# Patient Record
Sex: Male | Born: 1950
Health system: Southern US, Community
[De-identification: ages and names within clinical notes are randomized; demographics above are authoritative.]

## PROBLEM LIST (undated history)

## (undated) DIAGNOSIS — R7302 Impaired glucose tolerance (oral): Secondary | ICD-10-CM

## (undated) DIAGNOSIS — M5416 Radiculopathy, lumbar region: Secondary | ICD-10-CM

## (undated) DIAGNOSIS — Z800 Family history of malignant neoplasm of digestive organs: Secondary | ICD-10-CM

## (undated) DIAGNOSIS — T594X4A Toxic effect of chlorine gas, undetermined, initial encounter: Secondary | ICD-10-CM

## (undated) DIAGNOSIS — R209 Unspecified disturbances of skin sensation: Secondary | ICD-10-CM

## (undated) DIAGNOSIS — I5189 Other ill-defined heart diseases: Secondary | ICD-10-CM

## (undated) DIAGNOSIS — M4802 Spinal stenosis, cervical region: Secondary | ICD-10-CM

## (undated) DIAGNOSIS — M1990 Unspecified osteoarthritis, unspecified site: Secondary | ICD-10-CM

## (undated) DIAGNOSIS — M549 Dorsalgia, unspecified: Secondary | ICD-10-CM

## (undated) DIAGNOSIS — E785 Hyperlipidemia, unspecified: Secondary | ICD-10-CM

## (undated) HISTORY — DX: Family history of malignant neoplasm of digestive organs: Z80.0

## (undated) HISTORY — DX: Radiculopathy, lumbar region: M54.16

## (undated) HISTORY — PX: COLONOSCOPY: SHX174

## (undated) HISTORY — PX: BUNIONECTOMY: SHX129

## (undated) HISTORY — DX: Impaired glucose tolerance (oral): R73.02

## (undated) HISTORY — DX: Toxic effect of chlorine gas, undetermined, initial encounter: T59.4X4A

## (undated) HISTORY — PX: FOOT SURGERY: SHX648

## (undated) HISTORY — DX: Hyperlipidemia, unspecified: E78.5

## (undated) HISTORY — PX: ROOT CANAL: SHX2363

## (undated) HISTORY — DX: Other ill-defined heart diseases: I51.89

## (undated) HISTORY — PX: OTHER SURGICAL HISTORY: SHX169

## (undated) HISTORY — DX: Unspecified disturbances of skin sensation: R20.9

## (undated) HISTORY — DX: Unspecified osteoarthritis, unspecified site: M19.90

## (undated) HISTORY — DX: Dorsalgia, unspecified: M54.9

---

## 1998-12-10 ENCOUNTER — Encounter: Payer: Self-pay | Admitting: Internal Medicine

## 1999-02-18 ENCOUNTER — Ambulatory Visit (HOSPITAL_COMMUNITY): Admission: RE | Admit: 1999-02-18 | Discharge: 1999-02-18 | Payer: Self-pay | Admitting: Internal Medicine

## 1999-08-08 ENCOUNTER — Encounter: Payer: Self-pay | Admitting: Pulmonary Disease

## 1999-08-08 ENCOUNTER — Ambulatory Visit (HOSPITAL_COMMUNITY): Admission: RE | Admit: 1999-08-08 | Discharge: 1999-08-08 | Payer: Self-pay | Admitting: Pulmonary Disease

## 2000-01-26 ENCOUNTER — Inpatient Hospital Stay (HOSPITAL_COMMUNITY): Admission: EM | Admit: 2000-01-26 | Discharge: 2000-01-26 | Payer: Self-pay | Admitting: Emergency Medicine

## 2004-07-12 ENCOUNTER — Ambulatory Visit: Payer: Self-pay | Admitting: Internal Medicine

## 2004-10-05 ENCOUNTER — Ambulatory Visit: Payer: Self-pay | Admitting: Internal Medicine

## 2006-08-06 ENCOUNTER — Ambulatory Visit: Payer: Self-pay | Admitting: Internal Medicine

## 2006-08-06 LAB — CONVERTED CEMR LAB
ALT: 27 units/L (ref 0–40)
AST: 33 units/L (ref 0–37)
Albumin: 3.8 g/dL (ref 3.5–5.2)
Alkaline Phosphatase: 75 units/L (ref 39–117)
BUN: 14 mg/dL (ref 6–23)
Basophils Absolute: 0.4 10*3/uL — ABNORMAL HIGH (ref 0.0–0.1)
Basophils Relative: 5.1 % — ABNORMAL HIGH (ref 0.0–1.0)
Bilirubin Urine: NEGATIVE
CO2: 29 meq/L (ref 19–32)
Calcium: 9.1 mg/dL (ref 8.4–10.5)
Chloride: 107 meq/L (ref 96–112)
Chol/HDL Ratio, serum: 3.3
Cholesterol: 255 mg/dL (ref 0–200)
Creatinine, Ser: 1.2 mg/dL (ref 0.4–1.5)
Crystals: NEGATIVE
Eosinophil percent: 8.9 % — ABNORMAL HIGH (ref 0.0–5.0)
GFR calc non Af Amer: 66 mL/min
Glomerular Filtration Rate, Af Am: 80 mL/min/{1.73_m2}
Glucose, Bld: 112 mg/dL — ABNORMAL HIGH (ref 70–99)
HCT: 43.8 % (ref 39.0–52.0)
HDL: 76.4 mg/dL (ref >39.0)
Hemoglobin: 14.4 g/dL (ref 13.0–17.0)
Ketones, ur: NEGATIVE mg/dL
LDL DIRECT: 171.7 mg/dL
Leukocytes, UA: NEGATIVE
Lymphocytes Relative: 49.2 % — ABNORMAL HIGH (ref 12.0–46.0)
MCHC: 32.9 g/dL (ref 30.0–36.0)
MCV: 93.2 fL (ref 78.0–100.0)
Monocytes Absolute: 0.6 10*3/uL (ref 0.2–0.7)
Monocytes Relative: 8.5 % (ref 3.0–11.0)
Mucus, UA: NEGATIVE
Neutro Abs: 2.1 10*3/uL (ref 1.4–7.7)
Neutrophils Relative %: 28.3 % — ABNORMAL LOW (ref 43.0–77.0)
Nitrite: NEGATIVE
PSA: 0.8 ng/mL (ref 0.10–4.00)
Platelets: 198 10*3/uL (ref 150–400)
Potassium: 3.9 meq/L (ref 3.5–5.1)
RBC: 4.7 M/uL (ref 4.22–5.81)
RDW: 11.8 % (ref 11.5–14.6)
Sodium: 142 meq/L (ref 135–145)
Specific Gravity, Urine: 1.025 (ref 1.000–1.03)
TSH: 3.3 microintl units/mL (ref 0.35–5.50)
Total Bilirubin: 1.3 mg/dL — ABNORMAL HIGH (ref 0.3–1.2)
Total Protein, Urine: NEGATIVE mg/dL
Total Protein: 6.8 g/dL (ref 6.0–8.3)
Triglyceride fasting, serum: 72 mg/dL (ref 0–149)
Urine Glucose: NEGATIVE mg/dL
Urobilinogen, UA: 0.2 (ref 0.0–1.0)
VLDL: 14 mg/dL (ref 0–40)
WBC: 7.5 10*3/uL (ref 4.5–10.5)
pH: 5.5 (ref 5.0–8.0)

## 2006-08-08 ENCOUNTER — Ambulatory Visit: Payer: Self-pay | Admitting: Internal Medicine

## 2006-08-17 ENCOUNTER — Ambulatory Visit: Payer: Self-pay

## 2006-08-17 ENCOUNTER — Encounter: Payer: Self-pay | Admitting: Cardiology

## 2007-01-18 ENCOUNTER — Ambulatory Visit: Payer: Self-pay | Admitting: Internal Medicine

## 2007-01-18 LAB — CONVERTED CEMR LAB
Cholesterol: 237 mg/dL (ref 0–200)
Direct LDL: 134.5 mg/dL
HDL: 73 mg/dL (ref >39.0)
Total CHOL/HDL Ratio: 3.2
Triglycerides: 59 mg/dL (ref 0–149)
VLDL: 12 mg/dL (ref 0–40)

## 2007-02-22 ENCOUNTER — Ambulatory Visit: Payer: Self-pay | Admitting: Cardiology

## 2007-07-01 ENCOUNTER — Encounter: Payer: Self-pay | Admitting: Internal Medicine

## 2007-07-01 DIAGNOSIS — E7800 Pure hypercholesterolemia, unspecified: Secondary | ICD-10-CM

## 2007-09-02 ENCOUNTER — Ambulatory Visit: Payer: Self-pay | Admitting: Internal Medicine

## 2007-09-02 DIAGNOSIS — E785 Hyperlipidemia, unspecified: Secondary | ICD-10-CM

## 2007-09-02 DIAGNOSIS — F411 Generalized anxiety disorder: Secondary | ICD-10-CM

## 2007-09-02 DIAGNOSIS — R209 Unspecified disturbances of skin sensation: Secondary | ICD-10-CM

## 2007-09-02 DIAGNOSIS — E739 Lactose intolerance, unspecified: Secondary | ICD-10-CM

## 2007-09-02 HISTORY — DX: Unspecified disturbances of skin sensation: R20.9

## 2007-09-02 HISTORY — DX: Hyperlipidemia, unspecified: E78.5

## 2007-11-22 ENCOUNTER — Ambulatory Visit: Payer: Self-pay | Admitting: Internal Medicine

## 2007-11-23 LAB — CONVERTED CEMR LAB
ALT: 29 units/L (ref 0–53)
AST: 36 units/L (ref 0–37)
Albumin: 3.8 g/dL (ref 3.5–5.2)
Alkaline Phosphatase: 71 units/L (ref 39–117)
BUN: 11 mg/dL (ref 6–23)
Bacteria, UA: NEGATIVE
Basophils Absolute: 0 10*3/uL (ref 0.0–0.1)
Basophils Relative: 0 % (ref 0.0–1.0)
Bilirubin Urine: NEGATIVE
Bilirubin, Direct: 0.2 mg/dL (ref 0.0–0.3)
CO2: 31 meq/L (ref 19–32)
Calcium: 9 mg/dL (ref 8.4–10.5)
Chloride: 107 meq/L (ref 96–112)
Cholesterol: 204 mg/dL (ref 0–200)
Creatinine, Ser: 1.1 mg/dL (ref 0.4–1.5)
Crystals: NEGATIVE
Direct LDL: 98.5 mg/dL
Eosinophils Absolute: 0.7 10*3/uL — ABNORMAL HIGH (ref 0.0–0.6)
Eosinophils Relative: 9.6 % — ABNORMAL HIGH (ref 0.0–5.0)
GFR calc Af Amer: 88 mL/min
GFR calc non Af Amer: 73 mL/min
Glucose, Bld: 77 mg/dL (ref 70–99)
HCT: 39.6 % (ref 39.0–52.0)
HDL: 91.4 mg/dL (ref >39.0)
Hemoglobin: 13.3 g/dL (ref 13.0–17.0)
Ketones, ur: NEGATIVE mg/dL
Leukocytes, UA: NEGATIVE
Lymphocytes Relative: 36.4 % (ref 12.0–46.0)
MCHC: 33.5 g/dL (ref 30.0–36.0)
MCV: 93.4 fL (ref 78.0–100.0)
Monocytes Absolute: 0.6 10*3/uL (ref 0.2–0.7)
Monocytes Relative: 8 % (ref 3.0–11.0)
Neutro Abs: 3.2 10*3/uL (ref 1.4–7.7)
Neutrophils Relative %: 46 % (ref 43.0–77.0)
Nitrite: NEGATIVE
PSA: 0.81 ng/mL (ref 0.10–4.00)
Platelets: 180 10*3/uL (ref 150–400)
Potassium: 3.5 meq/L (ref 3.5–5.1)
RBC: 4.24 M/uL (ref 4.22–5.81)
RDW: 11.8 % (ref 11.5–14.6)
Sodium: 143 meq/L (ref 135–145)
Specific Gravity, Urine: 1.02 (ref 1.000–1.03)
Squamous Epithelial / HPF: NEGATIVE /lpf
TSH: 1.39 microintl units/mL (ref 0.35–5.50)
Total Bilirubin: 1.3 mg/dL — ABNORMAL HIGH (ref 0.3–1.2)
Total CHOL/HDL Ratio: 2.2
Total Protein, Urine: NEGATIVE mg/dL
Total Protein: 6.6 g/dL (ref 6.0–8.3)
Triglycerides: 42 mg/dL (ref 0–149)
Urine Glucose: NEGATIVE mg/dL
Urobilinogen, UA: 0.2 (ref 0.0–1.0)
VLDL: 8 mg/dL (ref 0–40)
WBC, UA: NONE SEEN cells/hpf
WBC: 7.1 10*3/uL (ref 4.5–10.5)
pH: 6 (ref 5.0–8.0)

## 2007-12-02 ENCOUNTER — Ambulatory Visit: Payer: Self-pay | Admitting: Internal Medicine

## 2007-12-02 DIAGNOSIS — M25559 Pain in unspecified hip: Secondary | ICD-10-CM

## 2007-12-02 DIAGNOSIS — M25569 Pain in unspecified knee: Secondary | ICD-10-CM

## 2007-12-02 DIAGNOSIS — M79609 Pain in unspecified limb: Secondary | ICD-10-CM

## 2007-12-09 ENCOUNTER — Encounter: Payer: Self-pay | Admitting: Internal Medicine

## 2007-12-09 ENCOUNTER — Ambulatory Visit: Payer: Self-pay

## 2008-12-30 ENCOUNTER — Ambulatory Visit: Payer: Self-pay | Admitting: Internal Medicine

## 2008-12-30 LAB — CONVERTED CEMR LAB
ALT: 27 units/L (ref 0–53)
AST: 42 units/L — ABNORMAL HIGH (ref 0–37)
Albumin: 3.7 g/dL (ref 3.5–5.2)
Alkaline Phosphatase: 67 units/L (ref 39–117)
BUN: 17 mg/dL (ref 6–23)
Basophils Absolute: 0.1 10*3/uL (ref 0.0–0.1)
Basophils Relative: 1.2 % (ref 0.0–3.0)
Bilirubin Urine: NEGATIVE
Bilirubin, Direct: 0.5 mg/dL — ABNORMAL HIGH (ref 0.0–0.3)
CO2: 33 meq/L — ABNORMAL HIGH (ref 19–32)
Calcium: 8.9 mg/dL (ref 8.4–10.5)
Chloride: 105 meq/L (ref 96–112)
Cholesterol: 221 mg/dL — ABNORMAL HIGH (ref 0–200)
Creatinine, Ser: 1.3 mg/dL (ref 0.4–1.5)
Direct LDL: 123.1 mg/dL
Eosinophils Absolute: 0.9 10*3/uL — ABNORMAL HIGH (ref 0.0–0.7)
Eosinophils Relative: 13.4 % — ABNORMAL HIGH (ref 0.0–5.0)
GFR calc non Af Amer: 72.47 mL/min (ref >60)
Glucose, Bld: 99 mg/dL (ref 70–99)
HCT: 39 % (ref 39.0–52.0)
HDL: 72.8 mg/dL (ref >39.00)
Hemoglobin: 13.3 g/dL (ref 13.0–17.0)
Ketones, ur: NEGATIVE mg/dL
Leukocytes, UA: NEGATIVE
Lymphocytes Relative: 44.8 % (ref 12.0–46.0)
Lymphs Abs: 3.1 10*3/uL (ref 0.7–4.0)
MCHC: 34.2 g/dL (ref 30.0–36.0)
MCV: 92.6 fL (ref 78.0–100.0)
Monocytes Absolute: 0.5 10*3/uL (ref 0.1–1.0)
Monocytes Relative: 7.3 % (ref 3.0–12.0)
Neutro Abs: 2.3 10*3/uL (ref 1.4–7.7)
Neutrophils Relative %: 33.3 % — ABNORMAL LOW (ref 43.0–77.0)
Nitrite: NEGATIVE
PSA: 0.77 ng/mL (ref 0.10–4.00)
Platelets: 188 10*3/uL (ref 150.0–400.0)
Potassium: 5.2 meq/L — ABNORMAL HIGH (ref 3.5–5.1)
RBC: 4.21 M/uL — ABNORMAL LOW (ref 4.22–5.81)
RDW: 11.6 % (ref 11.5–14.6)
Sodium: 145 meq/L (ref 135–145)
Specific Gravity, Urine: 1.02 (ref 1.000–1.030)
TSH: 1.69 microintl units/mL (ref 0.35–5.50)
Total Bilirubin: 1.6 mg/dL — ABNORMAL HIGH (ref 0.3–1.2)
Total CHOL/HDL Ratio: 3
Total Protein, Urine: NEGATIVE mg/dL
Total Protein: 6.8 g/dL (ref 6.0–8.3)
Triglycerides: 56 mg/dL (ref 0.0–149.0)
Urine Glucose: NEGATIVE mg/dL
Urobilinogen, UA: 0.2 (ref 0.0–1.0)
VLDL: 11.2 mg/dL (ref 0.0–40.0)
WBC: 6.9 10*3/uL (ref 4.5–10.5)
pH: 5.5 (ref 5.0–8.0)

## 2009-01-04 ENCOUNTER — Ambulatory Visit: Payer: Self-pay | Admitting: Internal Medicine

## 2009-02-17 ENCOUNTER — Telehealth (INDEPENDENT_AMBULATORY_CARE_PROVIDER_SITE_OTHER): Payer: Self-pay | Admitting: *Deleted

## 2009-09-11 HISTORY — PX: OTHER SURGICAL HISTORY: SHX169

## 2010-01-31 ENCOUNTER — Encounter: Payer: Self-pay | Admitting: Internal Medicine

## 2010-02-15 ENCOUNTER — Ambulatory Visit: Payer: Self-pay | Admitting: Internal Medicine

## 2010-02-15 LAB — CONVERTED CEMR LAB
Alkaline Phosphatase: 79 units/L (ref 39–117)
Basophils Relative: 0.5 % (ref 0.0–3.0)
Bilirubin, Direct: 0.1 mg/dL (ref 0.0–0.3)
CO2: 31 meq/L (ref 19–32)
Direct LDL: 125 mg/dL
Eosinophils Absolute: 0.7 10*3/uL (ref 0.0–0.7)
Eosinophils Relative: 8.9 % — ABNORMAL HIGH (ref 0.0–5.0)
Glucose, Bld: 96 mg/dL (ref 70–99)
HDL: 73.8 mg/dL (ref >39.00)
Lymphocytes Relative: 39.8 % (ref 12.0–46.0)
MCHC: 34.2 g/dL (ref 30.0–36.0)
Monocytes Relative: 9.5 % (ref 3.0–12.0)
Neutrophils Relative %: 41.3 % — ABNORMAL LOW (ref 43.0–77.0)
Potassium: 4.3 meq/L (ref 3.5–5.1)
RBC: 4.29 M/uL (ref 4.22–5.81)
Sodium: 143 meq/L (ref 135–145)
Total CHOL/HDL Ratio: 3
Total Protein: 6.7 g/dL (ref 6.0–8.3)
VLDL: 15 mg/dL (ref 0.0–40.0)
WBC: 7.3 10*3/uL (ref 4.5–10.5)

## 2010-02-18 ENCOUNTER — Ambulatory Visit (HOSPITAL_COMMUNITY): Admission: RE | Admit: 2010-02-18 | Discharge: 2010-02-18 | Payer: Self-pay | Admitting: General Surgery

## 2010-02-21 ENCOUNTER — Ambulatory Visit: Payer: Self-pay | Admitting: Internal Medicine

## 2010-02-22 LAB — CONVERTED CEMR LAB
Ketones, ur: NEGATIVE mg/dL
Leukocytes, UA: NEGATIVE
Nitrite: NEGATIVE
Specific Gravity, Urine: 1.02 (ref 1.000–1.030)
pH: 6.5 (ref 5.0–8.0)

## 2010-02-28 ENCOUNTER — Ambulatory Visit: Payer: Self-pay | Admitting: Internal Medicine

## 2010-02-28 DIAGNOSIS — G8190 Hemiplegia, unspecified affecting unspecified side: Secondary | ICD-10-CM | POA: Insufficient documentation

## 2010-03-07 ENCOUNTER — Encounter: Payer: Self-pay | Admitting: Internal Medicine

## 2010-03-30 ENCOUNTER — Telehealth: Payer: Self-pay | Admitting: Internal Medicine

## 2010-10-13 NOTE — Letter (Signed)
Summary: [facility]   Imported By: Sherian Rein 02/11/2010 11:20:33  _____________________________________________________________________  External Attachment:    Type:   Image     Comment:   External Document

## 2010-10-13 NOTE — Progress Notes (Signed)
Summary: PSA/CSA test  Phone Note Call from Patient Call back at Home Phone [phone] Call back at [phone]    Caller: Spouse Summary of Call: Pt's wife Elease Hashimoto called to ask if PSA or CSA blood test was done for pt during last OV. They are trying to complete a Cancer Screening Wellness Benefit Claim Form and need lab reports on either test--pt was last seen 02/28/10--please advise Initial call taken by: Brenton Grills MA,  March 30, 2010 2:13 PM  Follow-up for Phone Call        I dont know what a csa report is  o/w to robin to handle Follow-up by: Corwin Levins MD,  March 30, 2010 2:24 PM  Additional Follow-up for Phone Call Additional follow up Details #1::        called pt and spoke to wife. They needed confirmation that he did have OV on 02/28/2010 and labs on 02/15/10 that included PSA. Additional Follow-up by: Robin Ewing CMA Duncan Dull),  March 30, 2010 2:54 PM

## 2010-10-13 NOTE — Assessment & Plan Note (Signed)
Summary: CPX/[employer]/#/CD   Vital Signs:  Patient profile:   60 year old male Height:      71 inches Weight:      209.25 pounds BMI:     29.29 O2 Sat:      95 % on Room air Temp:     98 degrees F oral Pulse rate:   71 / minute BP sitting:   112 / 86  (left arm) Cuff size:   large  Vitals Entered ByMarland Kitchen Zella Ball Ewing (February 28, 2010 8:05 AM)  O2 Flow:  Room air  CC: Adult Physcial/RE   CC:  Adult Physcial/RE.  History of Present Illness: stoipped his lipitor due to concern over whether it might be cuasing ED symtpoms which are intermittent only and he's not sure how to explain;  has been out of lipitor for about 6 mo; has been trying to do lower chol diet;  Pt denies CP, sob, doe, wheezing, orthopnea, pnd, worsening LE edema, palps, dizziness or syncope  .  Gained 7 lbs since last visit.  s/p recent scalp lipoma removed  also with sense or weakness and numbness to the RLE  - no pain but weak at times and numbness (occur at same time), but has to '"drag" the leg and seems heavy and seems to lag and has to slow ambulation;  no trouble with getting in and out of cars.   Had LE art dopplers normal 2010.  Saw cardiology 2010 - ok exam.    also with pain to the hand (wrist is ok) that he calls arthritis, and trying to make it one more year with work - plans to retire at 41.  Does chemical packaging.  Has pain to the "whole hand" with tingling and pain.  No swelling or loss of grip strength.  Hand pain slowly improved over wks if off work.    Preventive Screening-Counseling & Management      Drug Use:  no.    Problems Prior to Update: 1)  Weakness, Right Side of Body  (ICD-342.90) 2)  Hand Pain, Right  (ICD-729.5) 3)  Knee Pain  (ICD-719.46) 4)  Preventive Health Care  (ICD-V70.0) 5)  Hip Pain, Right  (ICD-719.45) 6)  Knee Pain, Right  (ICD-719.46) 7)  Leg Pain, Right  (ICD-729.5) 8)  Preventive Health Care  (ICD-V70.0) 9)  Glucose Intolerance  (ICD-271.3) 10)  Hyperlipidemia   (ICD-272.4) 11)  Anxiety State, Unspecified  (ICD-300.00) 12)  Paresthesia  (ICD-782.0) 13)  Hypercholesterolemia  (ICD-272.0)  Medications Prior to Update: 1)  Lipitor 40 Mg Tabs (Atorvastatin Calcium) .Marland Kitchen.. 1po Once Daily 2)  Tylenol Extra Strength 500 Mg  Tabs (Acetaminophen) 3)  Adult Aspirin Ec Low Strength 81 Mg Tbec (Aspirin) .Marland Kitchen.. 1 By Mouth Once Daily 4)  Meloxicam 15 Mg Tabs (Meloxicam) .Marland Kitchen.. 1 By Mouth Once Daily As Needed  Current Medications (verified): 1)  Lipitor 10 Mg Tabs (Atorvastatin Calcium) .Marland Kitchen.. 1po Once Daily 2)  Tylenol Extra Strength 500 Mg  Tabs (Acetaminophen) 3)  Adult Aspirin Ec Low Strength 81 Mg Tbec (Aspirin) .Marland Kitchen.. 1 By Mouth Once Daily 4)  Meloxicam 15 Mg Tabs (Meloxicam) .Marland Kitchen.. 1 By Mouth Once Daily As Needed  Allergies (verified): 1)  ! Levaquin  Past History:  Family History: Last updated: 09/02/2007 mother with MI in her 17's colon cancer brother with stroke at 82 yo  Social History: Last updated: 02/28/2010 Former Smoker Alcohol use-yes Married work - Licensed conveyancer Drug use-no  Risk Factors: Smoking Status: quit (09/02/2007)  Past Medical History: Reviewed history from 09/02/2007 and no changes required. Hyperlipidemia glucose intolerance  Past Surgical History: s/p lipoma right scalp posteriorly 2011  Social History: Former Smoker Alcohol use-yes Married work - Licensed conveyancer Drug use-no Drug Use:  no  Review of Systems  The patient denies anorexia, fever, weight loss, weight gain, vision loss, decreased hearing, hoarseness, chest pain, syncope, dyspnea on exertion, peripheral edema, prolonged cough, headaches, hemoptysis, abdominal pain, melena, hematochezia, severe indigestion/heartburn, hematuria, suspicious skin lesions, transient blindness, depression, unusual weight change, abnormal bleeding, enlarged lymph nodes, and angioedema.         all otherwise negative per pt -    Physical Exam  General:  alert and  overweight-appearing.   Head:  normocephalic and atraumatic.   Eyes:  vision grossly intact, pupils equal, and pupils round.   Ears:  R ear normal and L ear normal.   Nose:  no external deformity and no nasal discharge.   Mouth:  no gingival abnormalities and pharynx pink and moist.   Neck:  supple and no masses.   Lungs:  normal respiratory effort and normal breath sounds.   Heart:  normal rate and regular rhythm.   Abdomen:  soft, non-tender, and normal bowel sounds.   Msk:  no joint tenderness and no joint swelling.   Extremities:  no edema, no erythema  Neurologic:  cranial nerves II-XII intact and strength normal in all extremities.   Skin:  color normal and no rashes.   Psych:  normally interactive and moderately anxious.     Impression & Recommendations:  Problem # 1:  Preventive Health Care (ICD-V70.0) Overall doing well, age appropriate education and counseling updated and referral for appropriate preventive services done unless declined, immunizations up to date or declined, diet counseling done if overweight, urged to quit smoking if smokes , most recent labs reviewed and current ordered if appropriate, ecg reviewed or declined (interpretation per ECG scanned in the EMR if done); information regarding Medicare Prevention requirements given if appropriate; speciality referrals updated as appropriate   Problem # 2:  HYPERLIPIDEMIA (ICD-272.4)  His updated medication list for this problem includes:    Lipitor 10 Mg Tabs (Atorvastatin calcium) .Marland Kitchen... 1po once daily with better diet, but with goal ldl < 100 will re-start the lipitor at 10 mg per day  Problem # 3:  GLUCOSE INTOLERANCE (ICD-271.3) asympt - lab normal, ok to follow  Problem # 4:  HAND PAIN, RIGHT (ICD-729.5)  prob element of DJD , but I suspect predominant problem is CTS - mild now, it seems, declines hand surgury, for now will try right wrist splint at night  Orders: Ankle / Wrist Splint (A4570)  Problem # 5:   WEAKNESS, RIGHT SIDE OF BODY (ICD-342.90) RLE only, subjective, exam benign, ok to follow for now -  ? painless radiculitis/opathy intermittent  Complete Medication List: 1)  Lipitor 10 Mg Tabs (Atorvastatin calcium) .Marland Kitchen.. 1po once daily 2)  Tylenol Extra Strength 500 Mg Tabs (Acetaminophen) 3)  Adult Aspirin Ec Low Strength 81 Mg Tbec (Aspirin) .Marland Kitchen.. 1 by mouth once daily 4)  Meloxicam 15 Mg Tabs (Meloxicam) .Marland Kitchen.. 1 by mouth once daily as needed  Patient Instructions: 1)  Please take all new medications as prescribed  2)  Continue all previous medications as before this visit  3)  please tyr wearing the right wrist splint at night to help with the hand pain the next day 4)  Please schedule a follow-up appointment in 1 year or sooner  if needed Prescriptions: MELOXICAM 15 MG TABS (MELOXICAM) 1 by mouth once daily as needed  #90 x 3   Entered and Authorized by:   Corwin Levins MD   Signed by:   Corwin Levins MD on 02/28/2010   Method used:   Print then Give to Patient   RxID:   [id number] LIPITOR 10 MG TABS (ATORVASTATIN CALCIUM) 1po once daily  #90 x 3   Entered and Authorized by:   Corwin Levins MD   Signed by:   Corwin Levins MD on 02/28/2010   Method used:   Print then Give to Patient   RxID:   [id number]

## 2010-10-13 NOTE — Letter (Signed)
Summary: [facility]   Imported By: Sherian Rein 03/18/2010 10:38:03  _____________________________________________________________________  External Attachment:    Type:   Image     Comment:   External Document

## 2010-10-31 ENCOUNTER — Ambulatory Visit (INDEPENDENT_AMBULATORY_CARE_PROVIDER_SITE_OTHER): Payer: Managed Care, Other (non HMO) | Admitting: Internal Medicine

## 2010-10-31 ENCOUNTER — Encounter: Payer: Self-pay | Admitting: Internal Medicine

## 2010-10-31 DIAGNOSIS — M549 Dorsalgia, unspecified: Secondary | ICD-10-CM

## 2010-10-31 DIAGNOSIS — T594X4A Toxic effect of chlorine gas, undetermined, initial encounter: Secondary | ICD-10-CM

## 2010-10-31 DIAGNOSIS — E739 Lactose intolerance, unspecified: Secondary | ICD-10-CM

## 2010-10-31 DIAGNOSIS — T594X1A Toxic effect of chlorine gas, accidental (unintentional), initial encounter: Secondary | ICD-10-CM | POA: Insufficient documentation

## 2010-10-31 DIAGNOSIS — E785 Hyperlipidemia, unspecified: Secondary | ICD-10-CM

## 2010-10-31 HISTORY — DX: Dorsalgia, unspecified: M54.9

## 2010-10-31 HISTORY — DX: Toxic effect of chlorine gas, undetermined, initial encounter: T59.4X4A

## 2010-11-08 NOTE — Letter (Signed)
Summary: Generic Letter  [facility]  [address] [place], [place] [address]   Phone: [phone]  Fax: [phone]    10/31/2010  [facility] [address] [place], [place]  [address]  [place]  Dear Mr. Orozco,       You have been a patient of mine and [employer]  for many years for general medical purposes.  Approximately 21  years ago, you sustained a back injury while lifting at work  and since then have been unable to lift heavy weights without  significant recurrent pain.  Evaluation today indicates that you  should be restricted to lifting a maximum of 25 lbs  due to the  previous injury.       You should also not be exposed to chlorine due to history of  allergies and asthma, which is chronic.       Thank you very much. Please feel free to to contact the office  for further assistance as you might need.     Sincerely,   Oliver Barre MD

## 2010-11-08 NOTE — Letter (Signed)
Summary: Letter / Geneticist, molecular / [employer]   Imported By: Lennie Odor 11/01/2010 12:10:06  _____________________________________________________________________  External Attachment:    Type:   Image     Comment:   External Document

## 2010-11-08 NOTE — Assessment & Plan Note (Signed)
Summary: FOLLOW UP/ ISSUE TO DISCUSS / NWS  #   Vital Signs:  Patient profile:   60 year old male Height:      72 inches Weight:      217.38 pounds BMI:     29.59 O2 Sat:      93 % on Room air Temp:     98 degrees F oral Pulse rate:   85 / minute BP sitting:   132 / 86  (left arm) Cuff size:   large  Vitals Entered By: Zella Ball Ewing CMA (AAMA) (October 31, 2010 8:08 AM)  O2 Flow:  Room air CC: followup/RE   CC:  followup/RE.  History of Present Illness: here to f/u; overall doing ok, but is going home to [place] for the whole month of april;  presents with  letter done per Dr Kriste Basque form mar 2000 indicating work restrictions , with wt limit lifting 25 lbs due to back disorder, and to avoid exposure to chlorine due to hx of allergies and dyspnea.  Work says needs updated letter.    Pt states he is doing about the same, and written work responsibility  states he shoudl be able to lift up to 50 lbs.  Still has recurring chronic over the yrs back pain, gradually worse in that he is reaquired to use pain meds more frequently - on average now once per day.  No change in bowel or bladder function, no LE pain/ weak/numb, no falls or injury, wt loss, fever.  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) 2)  Back Pain, Chronic  (ICD-724.5) 3)  Weakness, Right Side of Body  (ICD-342.90) 4)  Hand Pain, Right  (ICD-729.5) 5)  Knee Pain  (ICD-719.46) 6)  Preventive Health Care  (ICD-V70.0) 7)  Hip Pain, Right  (ICD-719.45) 8)  Knee Pain, Right  (ICD-719.46) 9)  Leg Pain, Right  (ICD-729.5) 10)  Preventive Health Care  (ICD-V70.0) 11)  Glucose  Intolerance  (ICD-271.3) 12)  Hyperlipidemia  (ICD-272.4) 13)  Anxiety State, Unspecified  (ICD-300.00) 14)  Paresthesia  (ICD-782.0) 15)  Hypercholesterolemia  (ICD-272.0)  Medications Prior to Update: 1)  Lipitor 10 Mg Tabs (Atorvastatin Calcium) .Marland Kitchen.. 1po Once Daily 2)  Tylenol Extra Strength 500 Mg  Tabs (Acetaminophen) 3)  Adult Aspirin Ec Low Strength 81 Mg Tbec (Aspirin) .Marland Kitchen.. 1 By Mouth Once Daily 4)  Meloxicam 15 Mg Tabs (Meloxicam) .Marland Kitchen.. 1 By Mouth Once Daily As Needed  Current Medications (verified): 1)  Lipitor 10 Mg Tabs (Atorvastatin Calcium) .Marland Kitchen.. 1po Once Daily 2)  Tylenol Extra Strength 500 Mg  Tabs (Acetaminophen) 3)  Adult Aspirin Ec Low Strength 81 Mg Tbec (Aspirin) .Marland Kitchen.. 1 By Mouth Once Daily 4)  Meloxicam 15 Mg Tabs (Meloxicam) .Marland Kitchen.. 1 By Mouth Once Daily As Needed  Allergies (verified): 1)  ! Levaquin  Past History:  Social History: Last updated: 02/28/2010 Former Smoker Alcohol use-yes Married work - Licensed conveyancer Drug use-no  Risk Factors: Smoking Status: quit (09/02/2007)  Past Medical History: Hyperlipidemia glucose intolerance chronic recurrent back pain chlorine allergy/dyspnea  Past Surgical History: Reviewed history from 02/28/2010 and no changes required. s/p lipoma right  scalp posteriorly 2011  Review of Systems       all otherwise negative per pt -    Physical Exam  General:  alert and overweight-appearing.   Head:  normocephalic and atraumatic.   Eyes:  vision grossly intact, pupils equal, and pupils round.   Ears:  R ear normal and L ear normal.   Nose:  no external deformity and no nasal discharge.   Mouth:  no gingival abnormalities and pharynx pink and moist.   Neck:  supple and no masses.   Lungs:  normal respiratory effort and normal breath sounds.   Heart:  normal rate and regular rhythm.   Abdomen:  soft, non-tender, and normal bowel sounds.   Msk:  no joint tenderness and no joint swelling., no spine tenderness     Extremities:  no edema, no erythema  Neurologic:  strength normal in all extremities, gait normal, and DTRs symmetrical and normal.   Psych:  not depressed appearing and slightly anxious.     Impression & Recommendations:  Problem # 1:  BACK PAIN, CHRONIC (ICD-724.5)  His updated medication list for this problem includes:    Tylenol Extra Strength 500 Mg Tabs (Acetaminophen)    Adult Aspirin Ec Low Strength 81 Mg Tbec (Aspirin) .Marland Kitchen... 1 by mouth once daily    Meloxicam 15 Mg Tabs (Meloxicam) .Marland Kitchen... 1 by mouth once daily as needed stable overall by hx and exam, ok to continue meds/tx as is , ok fo letter for work  Problem # 2:  TOXIC EFFECT OF CHLORINE GAS (ICD-987.6) also to include in the letter   Problem # 3:  GLUCOSE INTOLERANCE (ICD-271.3) asympt - last labs reviewed  - ok to follow, Pt to cont DM diet, excercise, wt control efforts  Problem # 4:  HYPERLIPIDEMIA (ICD-272.4)  His updated medication list for this problem includes:    Lipitor 10 Mg Tabs (Atorvastatin calcium) .Marland Kitchen... 1po once daily  Labs Reviewed: SGOT: 27 (02/15/2010)   SGPT: 25 (02/15/2010)   HDL:73.80 (02/15/2010), 72.80 (12/30/2008)  LDL:DEL (11/22/2007), DEL (01/18/2007)  Chol:217 (02/15/2010), 221 (12/30/2008)  Trig:75.0 (02/15/2010), 56.0 (12/30/2008) stable overall by hx and exam, ok to continue meds/tx as is - Pt to continue diet efforts, good med tolerance; to check labs next visit per pt - goal LDL less than 100  Complete Medication List: 1)  Lipitor 10 Mg Tabs (Atorvastatin calcium) .Marland Kitchen.. 1po once daily 2)  Tylenol Extra Strength 500 Mg Tabs (Acetaminophen) 3)  Adult Aspirin Ec Low Strength 81 Mg Tbec (Aspirin) .Marland Kitchen.. 1 by mouth once daily 4)  Meloxicam 15 Mg Tabs (Meloxicam) .Marland Kitchen.. 1 by mouth once daily as needed  Patient Instructions: 1)  You are given the letter today 2)  Continue all previous medications as before this visit 3)  Please schedule a follow-up appointment in 4 months for CPX with  labs   Orders Added: 1)  Est. Patient Level IV [81191]

## 2010-11-28 LAB — SURGICAL PCR SCREEN: MRSA, PCR: NEGATIVE

## 2011-01-23 ENCOUNTER — Telehealth: Payer: Self-pay | Admitting: Internal Medicine

## 2011-01-23 NOTE — Telephone Encounter (Signed)
Patients wife states that actually, patient is not having any problems. She is having some problems and wanted a refill because she uses this medication as well. I have told her that we need her to be seen then. Please see her chart for that conversation.

## 2011-01-24 NOTE — Assessment & Plan Note (Signed)
[facility]                            CARDIOLOGY OFFICE NOTE   NAME:Matthew Tucker, Matthew Tucker                     MRN:          [id number]  DATE:02/22/2007                            DOB:          Mar 09, 1951    [name] is a very active pleasant gentleman who is here for cardiac  evaluation. Historically, he has had mild EKG changes. These have not  changed over time. He had a 2D echocardiogram in December 2007. That  study shows that his ventricle measures at 57 mm in diastole. In the  report, this was said to be mildly dilated. This is actually a top  normal measurement.   The patient continues to be active. He continues to play soccer. He is  not having any chest pain. He admits that he clearly cannot move as  rapidly as the past, but he is 60 years old. He is not having any  significant chest pain or shortness of breath. He was referred for  further evaluation to be sure that he is okay.   PAST MEDICAL HISTORY:   ALLERGIES:  NEVIQUIN?   MEDICATIONS:  1. Tylenol.  2. Simvastatin.   OTHER MEDICAL PROBLEMS:  See the list below.   REVIEW OF SYSTEMS:  He has some mild headaches at times. He has some  mild arthritis and constipation and otherwise, his review of systems is  negative.   SOCIAL HISTORY:  The patient is married and he works as a IT trainer and a department head, and he remains active. He  smoked and quit 15 years ago.   FAMILY HISTORY:  His mother did have a heart attack in her 64s.   PHYSICAL EXAMINATION:  GENERAL:  The patient is oriented to person,  time, and place. Affect is normal.  VITAL SIGNS:  Blood pressure 130/80 with a pulse of 61.  HEENT:  No xanthelasma. He has normal extraocular motion. Conjunctiva  are normal. There are no carotid bruits. There is no jugular venous  distension.  LUNGS:  Clear. Respiratory effort is not labored.  CARDIAC:  Reveals an S1 with an S2. There no clicks or  significant  murmurs.  ABDOMEN:  Soft. There are no masses or bruits. He has normal bowel  sounds. He has normal distal pulses and there is no peripheral edema.  MUSCULOSKELETAL:  No deformities.   LABORATORY DATA:  His EKG reveals mild voltage criteria for LVH with non-  specific STT wave changes. This is unchanged from other tracings done in  past years. The patient's 2D echocardiogram in December 2007 revealed  top normal ventricular size. There was no left ventricular hypertrophy.  Ejection fraction was 55-65%. There were no focal wall motion  abnormalities. There were no significant valvular abnormalities. His  aortic root was of upper normal size.   PROBLEMS:  1. Family history of colon cancer for which he has screening      colonoscopies done by Dr. Lina Sar and he has been stable.  2. Mildly abnormal EKG that can be considered a normal variant for      him.  3. Top  normal left ventricular size. He has normal left ventricular      wall thickness and wall motion. At this time, I feel that the      patient has no significant cardiac disease. On a screening basis,      he could have a treadmill, but at this time, this is not absolutely      necessary and he would prefer to wait. I made it clear to him that      I thought that his overall cardiac status was stable.     Matthew Abed, MD, [facility]  Electronically Signed    JDK/MedQ  DD: 02/22/2007  DT: 02/23/2007  Job #: [id number]   cc:   Corwin Levins, MD

## 2011-08-02 ENCOUNTER — Encounter: Payer: Self-pay | Admitting: Internal Medicine

## 2011-08-09 ENCOUNTER — Telehealth: Payer: Self-pay

## 2011-08-09 DIAGNOSIS — Z0000 Encounter for general adult medical examination without abnormal findings: Secondary | ICD-10-CM

## 2011-08-09 DIAGNOSIS — Z1289 Encounter for screening for malignant neoplasm of other sites: Secondary | ICD-10-CM

## 2011-08-09 NOTE — Telephone Encounter (Signed)
Put order in for physical labs. 

## 2011-08-10 ENCOUNTER — Ambulatory Visit (AMBULATORY_SURGERY_CENTER): Payer: Managed Care, Other (non HMO) | Admitting: *Deleted

## 2011-08-10 VITALS — Ht 70.0 in | Wt 218.0 lb

## 2011-08-10 DIAGNOSIS — Z1211 Encounter for screening for malignant neoplasm of colon: Secondary | ICD-10-CM

## 2011-08-10 MED ORDER — PEG-KCL-NACL-NASULF-NA ASC-C 100 G PO SOLR: ORAL | Status: DC

## 2011-08-11 ENCOUNTER — Encounter: Payer: Self-pay | Admitting: Internal Medicine

## 2011-08-22 ENCOUNTER — Ambulatory Visit (AMBULATORY_SURGERY_CENTER): Payer: Managed Care, Other (non HMO) | Admitting: Internal Medicine

## 2011-08-22 ENCOUNTER — Encounter: Payer: Self-pay | Admitting: Internal Medicine

## 2011-08-22 VITALS — BP 124/58 | HR 59 | Temp 97.6°F | Resp 18 | Ht 70.0 in | Wt 218.0 lb

## 2011-08-22 DIAGNOSIS — Z1211 Encounter for screening for malignant neoplasm of colon: Secondary | ICD-10-CM

## 2011-08-22 DIAGNOSIS — K5120 Ulcerative (chronic) proctitis without complications: Secondary | ICD-10-CM

## 2011-08-22 DIAGNOSIS — D126 Benign neoplasm of colon, unspecified: Secondary | ICD-10-CM

## 2011-08-22 MED ORDER — SODIUM CHLORIDE 0.9 % IV SOLN: 500.0000 mL | INTRAVENOUS | Status: DC

## 2011-08-22 NOTE — Op Note (Signed)
[facility] [address] [place], [place]  [address]  COLONOSCOPY PROCEDURE REPORT  PATIENT:  Matthew Tucker, Matthew Tucker  MR#:  [id number] BIRTHDATE:  1951/03/19, 60 yrs. old  GENDER:  male ENDOSCOPIST:  Hedwig Morton. Juanda Chance, MD REF. BY: PROCEDURE DATE:  08/22/2011 PROCEDURE:  Colonoscopy with biopsy ASA CLASS:  Class II INDICATIONS:  family history of colon cancer pprior colon 1914,7829, 2005 MEDICATIONS:   These medications were titrated to patient response per physician's verbal order, Versed 12 mg, Fentanyl 125 mcg  DESCRIPTION OF PROCEDURE:   After the risks and benefits and of the procedure were explained, informed consent was obtained. Digital rectal exam was performed and revealed no rectal masses. The LB [id number] endoscope was introduced through the anus and advanced to the cecum, which was identified by both the appendix and ileocecal valve.  The quality of the prep was good, using MoviPrep.  The instrument was then slowly withdrawn as the colon was fully examined. <<PROCEDUREIMAGES>>  FINDINGS:  Two polyps were found. 3 mm and 5 mm polyps in the cecum The polyps were removed using cold biopsy forceps (see image2).  This was otherwise a normal examination of the colon (see image1 and image3).   Retroflexed views in the rectum revealed no abnormalities.    The scope was then withdrawn from the patient and the procedure completed.  COMPLICATIONS:  None ENDOSCOPIC IMPRESSION: 1) Two polyps 2) Otherwise normal examination RECOMMENDATIONS: 1) Await pathology results 2) High fiber diet.  REPEAT EXAM:  In 5 year(s) for.  ______________________________ Hedwig Morton. Juanda Chance, MD  CC:  n. eSIGNED:   Hedwig Morton. Maribelle Hopple at 08/22/2011 12:43 PM  [place], [place], [phone]

## 2011-08-22 NOTE — Patient Instructions (Signed)
Pt/ caregiver given handouts on polyps. Pt and caregiver have gone over discharge instructions and what to eat today.

## 2011-08-23 ENCOUNTER — Telehealth: Payer: Self-pay | Admitting: *Deleted

## 2011-08-23 NOTE — Telephone Encounter (Signed)
No answer. Message left. 

## 2011-08-28 ENCOUNTER — Encounter: Payer: Self-pay | Admitting: Internal Medicine

## 2011-08-29 ENCOUNTER — Encounter: Payer: Self-pay | Admitting: Internal Medicine

## 2011-08-29 DIAGNOSIS — Z0001 Encounter for general adult medical examination with abnormal findings: Secondary | ICD-10-CM | POA: Insufficient documentation

## 2011-08-29 DIAGNOSIS — Z1211 Encounter for screening for malignant neoplasm of colon: Secondary | ICD-10-CM | POA: Insufficient documentation

## 2011-08-29 DIAGNOSIS — R7302 Impaired glucose tolerance (oral): Secondary | ICD-10-CM

## 2011-08-29 HISTORY — DX: Impaired glucose tolerance (oral): R73.02

## 2011-09-04 ENCOUNTER — Other Ambulatory Visit: Payer: Self-pay | Admitting: Internal Medicine

## 2011-09-04 ENCOUNTER — Other Ambulatory Visit (INDEPENDENT_AMBULATORY_CARE_PROVIDER_SITE_OTHER): Payer: Managed Care, Other (non HMO)

## 2011-09-04 DIAGNOSIS — Z1289 Encounter for screening for malignant neoplasm of other sites: Secondary | ICD-10-CM

## 2011-09-04 DIAGNOSIS — Z0000 Encounter for general adult medical examination without abnormal findings: Secondary | ICD-10-CM

## 2011-09-04 LAB — CBC WITH DIFFERENTIAL/PLATELET
Basophils Absolute: 0 10*3/uL (ref 0.0–0.1)
Eosinophils Absolute: 0.6 10*3/uL (ref 0.0–0.7)
HCT: 42.8 % (ref 39.0–52.0)
Lymphs Abs: 3.7 10*3/uL (ref 0.7–4.0)
MCHC: 33.6 g/dL (ref 30.0–36.0)
Monocytes Absolute: 0.6 10*3/uL (ref 0.1–1.0)
Monocytes Relative: 7.3 % (ref 3.0–12.0)
Platelets: 212 10*3/uL (ref 150.0–400.0)
RDW: 12.9 % (ref 11.5–14.6)

## 2011-09-04 LAB — BASIC METABOLIC PANEL
CO2: 30 mEq/L (ref 19–32)
Calcium: 9.5 mg/dL (ref 8.4–10.5)
Creatinine, Ser: 1.2 mg/dL (ref 0.4–1.5)
GFR: 78.03 mL/min (ref >60.00)
Sodium: 142 mEq/L (ref 135–145)

## 2011-09-04 LAB — HEPATIC FUNCTION PANEL
Alkaline Phosphatase: 78 U/L (ref 39–117)
Bilirubin, Direct: 0.2 mg/dL (ref 0.0–0.3)
Total Bilirubin: 1.1 mg/dL (ref 0.3–1.2)

## 2011-09-04 LAB — URINALYSIS, ROUTINE W REFLEX MICROSCOPIC
Bilirubin Urine: NEGATIVE
Ketones, ur: NEGATIVE
Leukocytes, UA: NEGATIVE
pH: 6 (ref 5.0–8.0)

## 2011-09-04 LAB — LIPID PANEL
Cholesterol: 240 mg/dL — ABNORMAL HIGH (ref 0–200)
Total CHOL/HDL Ratio: 3
Triglycerides: 45 mg/dL (ref 0.0–149.0)

## 2011-09-08 ENCOUNTER — Encounter: Payer: Self-pay | Admitting: Internal Medicine

## 2011-09-08 ENCOUNTER — Ambulatory Visit (INDEPENDENT_AMBULATORY_CARE_PROVIDER_SITE_OTHER): Payer: Managed Care, Other (non HMO) | Admitting: Internal Medicine

## 2011-09-08 VITALS — BP 104/78 | HR 65 | Temp 98.5°F | Ht 71.0 in | Wt 212.2 lb

## 2011-09-08 DIAGNOSIS — R7302 Impaired glucose tolerance (oral): Secondary | ICD-10-CM

## 2011-09-08 DIAGNOSIS — E785 Hyperlipidemia, unspecified: Secondary | ICD-10-CM

## 2011-09-08 DIAGNOSIS — R7309 Other abnormal glucose: Secondary | ICD-10-CM

## 2011-09-08 DIAGNOSIS — Z23 Encounter for immunization: Secondary | ICD-10-CM

## 2011-09-08 DIAGNOSIS — Z0000 Encounter for general adult medical examination without abnormal findings: Secondary | ICD-10-CM

## 2011-09-08 DIAGNOSIS — R3129 Other microscopic hematuria: Secondary | ICD-10-CM

## 2011-09-08 DIAGNOSIS — J309 Allergic rhinitis, unspecified: Secondary | ICD-10-CM

## 2011-09-08 NOTE — Patient Instructions (Signed)
You had the flu shot today Please check with your insurance regarding the Shingles shot; if it is covered please make Nurse Appt to have this done You can take OTC allegra or Zyrtec for your allergy symptoms;  Please call if you need flonase in addition to this You will be contacted regarding the referral for: urology Please follow lower cholesterol diet Continue all other medications as before Please return in 6 mo with Lab testing done 3-5 days before

## 2011-09-08 NOTE — Assessment & Plan Note (Signed)

## 2011-09-10 ENCOUNTER — Encounter: Payer: Self-pay | Admitting: Internal Medicine

## 2011-09-10 DIAGNOSIS — J309 Allergic rhinitis, unspecified: Secondary | ICD-10-CM | POA: Insufficient documentation

## 2011-09-10 NOTE — Assessment & Plan Note (Signed)
stable overall by hx and exam, most recent data reviewed with pt, and pt to continue medical treatment as before, for a1c next visit Lab Results  Component Value Date   WBC 8.4 09/04/2011   HGB 14.4 09/04/2011   HCT 42.8 09/04/2011   PLT 212.0 09/04/2011   GLUCOSE 95 09/04/2011   CHOL 240* 09/04/2011   TRIG 45.0 09/04/2011   HDL 80.70 09/04/2011   LDLDIRECT 133.2 09/04/2011   ALT 20 09/04/2011   AST 24 09/04/2011   NA 142 09/04/2011   K 3.8 09/04/2011   CL 104 09/04/2011   CREATININE 1.2 09/04/2011   BUN 19 09/04/2011   CO2 30 09/04/2011   TSH 2.78 09/04/2011   PSA 1.20 09/04/2011

## 2011-09-10 NOTE — Assessment & Plan Note (Signed)
Unclear clinical signfiicance- for urology referral

## 2011-09-10 NOTE — Progress Notes (Signed)
Subjective:    Patient ID: Matthew Tucker, male    DOB: 1951-06-29, 60 y.o.   MRN: [id number]  HPI  Here for wellness and f/u;  Overall doing ok;  Pt denies CP, worsening SOB, DOE, wheezing, orthopnea, PND, worsening LE edema, palpitations, dizziness or syncope.  Pt denies neurological change such as new Headache, facial or extremity weakness.  Pt denies polydipsia, polyuria, or low sugar symptoms. Pt states overall good compliance with treatment and medications, good tolerability, and trying to follow lower cholesterol diet.  Pt denies worsening depressive symptoms, suicidal ideation or panic. No fever, wt loss, night sweats, loss of appetite, or other constitutional symptoms.  Pt states good ability with ADL's, low fall risk, home safety reviewed and adequate, no significant changes in hearing or vision, and occasionally active with exercise.  No overt bleeding or hematuria.  Does have several wks ongoing nasal allergy symptoms with clear congestion, itch and sneeze, without fever, pain, ST, cough or wheezing. Past Medical History  Diagnosis Date  . Arthritis   . Impaired glucose tolerance 08/29/2011  . Anxiety state, unspecified 09/02/2007  . BACK PAIN, CHRONIC 10/31/2010  . HIP PAIN, RIGHT 12/02/2007  . HYPERLIPIDEMIA 09/02/2007  . Pain in joint, lower leg 12/02/2007  . Pain in Soft Tissues of Limb 12/02/2007  . PARESTHESIA 09/02/2007  . Toxic effect of chlorine gas 10/31/2010  . WEAKNESS, RIGHT SIDE OF BODY 02/28/2010   Past Surgical History  Procedure Date  . Mass removal     back, forehead; benign  . Mass removal 2011    head; benign  . S/p lipoma right scalp posteriorly 2011    reports that he has quit smoking. He has never used smokeless tobacco. He reports that he drinks alcohol. He reports that he does not use illicit drugs. family history includes Cancer in his mother; Colon cancer (age of onset:68) in his father; and Stroke (age of onset:62) in his brother.  There is no history of  Esophageal cancer and Stomach cancer. Allergies  Allergen Reactions  . Levofloxacin     Irritates gums   Current Outpatient Prescriptions on File Prior to Visit  Medication Sig Dispense Refill  . acetaminophen (TYLENOL) 500 MG tablet Take 500 mg by mouth every 6 (six) hours as needed.        Marland Kitchen aspirin 81 MG tablet Take 81 mg by mouth daily.        Marland Kitchen atorvastatin (LIPITOR) 10 MG tablet Take 10 mg by mouth daily.        . meloxicam (MOBIC) 15 MG tablet Take 15 mg by mouth daily.         Current Facility-Administered Medications on File Prior to Visit  Medication Dose Route Frequency Provider Last Rate Last Dose  . 0.9 %  sodium chloride infusion  500 mL Intravenous Continuous Hart Carwin, MD       Review of Systems Review of Systems  Constitutional: Negative for diaphoresis, activity change, appetite change and unexpected weight change.  HENT: Negative for hearing loss, ear pain, facial swelling, mouth sores and neck stiffness.   Eyes: Negative for pain, redness and visual disturbance.  Respiratory: Negative for shortness of breath and wheezing.   Cardiovascular: Negative for chest pain and palpitations.  Gastrointestinal: Negative for diarrhea, blood in stool, abdominal distention and rectal pain.  Genitourinary: Negative for hematuria, flank pain and decreased urine volume.  Musculoskeletal: Negative for myalgias and joint swelling.  Skin: Negative for color change and wound.  Neurological:  Negative for syncope and numbness.  Hematological: Negative for adenopathy.  Psychiatric/Behavioral: Negative for hallucinations, self-injury, decreased concentration and agitation.      Objective:   Physical Exam BP 104/78  Pulse 65  Temp(Src) 98.5 F (36.9 C) (Oral)  Ht 5\' 11"  (1.803 m)  Wt 212 lb 3.2 oz (96.253 kg)  BMI 29.60 kg/m2  SpO2 98% Physical Exam  VS noted Constitutional: Pt is oriented to person, place, and time. Appears well-developed and well-nourished.  HENT:  Head:  Normocephalic and atraumatic.  Right Ear: External ear normal.  Left Ear: External ear normal.  Nose: Nose normal.  Mouth/Throat: Oropharynx is clear and moist.  Bilat tm's mild erythema.  Sinus nontender.  Pharynx mild erythema Eyes: Conjunctivae and EOM are normal. Pupils are equal, round, and reactive to light.  Neck: Normal range of motion. Neck supple. No JVD present. No tracheal deviation present.  Cardiovascular: Normal rate, regular rhythm, normal heart sounds and intact distal pulses.   Pulmonary/Chest: Effort normal and breath sounds normal.  Abdominal: Soft. Bowel sounds are normal. There is no tenderness.  Musculoskeletal: Normal range of motion. Exhibits no edema.  Lymphadenopathy:  Has no cervical adenopathy.  Neurological: Pt is alert and oriented to person, place, and time. Pt has normal reflexes. No cranial nerve deficit.  Skin: Skin is warm and dry. No rash noted.  Psychiatric:  Has  normal mood and affect. Behavior is normal.     Assessment & Plan:

## 2011-09-10 NOTE — Assessment & Plan Note (Signed)
Mild uncontrolled,  Declines statin, to cont lower chol diet

## 2011-09-10 NOTE — Assessment & Plan Note (Signed)
Ok for [employer] or zyrtec prn,  to f/u any worsening symptoms or concerns

## 2012-06-19 ENCOUNTER — Ambulatory Visit (INDEPENDENT_AMBULATORY_CARE_PROVIDER_SITE_OTHER)
Admission: RE | Admit: 2012-06-19 | Discharge: 2012-06-19 | Disposition: A | Discharge status: Home / Self Care | Payer: Managed Care, Other (non HMO) | Source: Ambulatory Visit | Attending: Internal Medicine | Admitting: Internal Medicine

## 2012-06-19 ENCOUNTER — Encounter: Payer: Self-pay | Admitting: Internal Medicine

## 2012-06-19 ENCOUNTER — Ambulatory Visit (INDEPENDENT_AMBULATORY_CARE_PROVIDER_SITE_OTHER): Payer: Managed Care, Other (non HMO) | Admitting: Internal Medicine

## 2012-06-19 VITALS — BP 102/62 | HR 73 | Temp 98.1°F | Resp 16 | Wt 218.0 lb

## 2012-06-19 DIAGNOSIS — R519 Headache, unspecified: Secondary | ICD-10-CM | POA: Insufficient documentation

## 2012-06-19 DIAGNOSIS — R51 Headache: Secondary | ICD-10-CM

## 2012-06-19 DIAGNOSIS — S0990XA Unspecified injury of head, initial encounter: Secondary | ICD-10-CM

## 2012-06-19 NOTE — Assessment & Plan Note (Signed)
He will get a CT scan done today to look for SDH, ICH, fracture, CVA

## 2012-06-19 NOTE — Progress Notes (Signed)
Subjective:    Patient ID: Matthew Tucker, male    DOB: 11/19/1950, 61 y.o.   MRN: [id number]  Head Injury  Incident onset: 2 weeks ago. Injury mechanism: hit his head while getting into an SUV. There was no loss of consciousness. There was no blood loss. The quality of the pain is described as aching. The pain is at a severity of 1/10. The patient is experiencing no pain. The pain has been intermittent since the injury. Pertinent negatives include no blurred vision, disorientation, headaches, memory loss, numbness, tinnitus, vomiting or weakness. He has tried NSAIDs for the symptoms. The treatment provided moderate relief.      Review of Systems  Constitutional: Negative.   HENT: Negative.  Negative for facial swelling and tinnitus.   Eyes: Negative.  Negative for blurred vision, photophobia and visual disturbance.  Respiratory: Negative.   Cardiovascular: Negative.   Gastrointestinal: Negative.  Negative for vomiting.  Genitourinary: Negative.   Musculoskeletal: Negative.   Skin: Negative.   Neurological: Positive for dizziness. Negative for tremors, seizures, syncope, facial asymmetry, speech difficulty, weakness, light-headedness, numbness and headaches.  Hematological: Negative.   Psychiatric/Behavioral: Negative.  Negative for memory loss.       Objective:   Physical Exam  Vitals reviewed. Constitutional: He is oriented to person, place, and time. He appears well-developed and well-nourished. No distress.  HENT:  Head: Normocephalic and atraumatic. Head is without raccoon's eyes, without Battle's sign, without abrasion, without contusion, without laceration, without right periorbital erythema and without left periorbital erythema.  Right Ear: External ear normal. No hemotympanum.  Left Ear: External ear normal. No hemotympanum.  Nose: Nose normal. No mucosal edema or sinus tenderness. Right sinus exhibits no maxillary sinus tenderness and no frontal sinus tenderness. Left  sinus exhibits no maxillary sinus tenderness and no frontal sinus tenderness.  Mouth/Throat: Oropharynx is clear and moist. No oropharyngeal exudate.  Eyes: Conjunctivae normal and EOM are normal. Pupils are equal, round, and reactive to light. Right eye exhibits no discharge. Left eye exhibits no discharge. No scleral icterus.  Neck: Normal range of motion. Neck supple. No JVD present. No tracheal deviation present. No thyromegaly present.  Cardiovascular: Normal rate, regular rhythm, normal heart sounds and intact distal pulses.  Exam reveals no gallop and no friction rub.   No murmur heard. Pulmonary/Chest: Effort normal and breath sounds normal. No stridor. No respiratory distress. He has no wheezes. He has no rales. He exhibits no tenderness.  Abdominal: Soft. Bowel sounds are normal. He exhibits no distension and no mass. There is no tenderness. There is no rebound and no guarding.  Musculoskeletal: Normal range of motion. He exhibits no edema and no tenderness.  Lymphadenopathy:    He has no cervical adenopathy.  Neurological: He is alert and oriented to person, place, and time. He has normal strength. He displays no atrophy, no tremor and normal reflexes. No cranial nerve deficit or sensory deficit. He exhibits normal muscle tone. He displays a negative Romberg sign. He displays no seizure activity. Coordination and gait normal. He displays no Babinski's sign on the right side. He displays no Babinski's sign on the left side.  Reflex Scores:      Tricep reflexes are 1+ on the right side and 1+ on the left side.      Bicep reflexes are 1+ on the right side and 1+ on the left side.      Brachioradialis reflexes are 1+ on the right side and 1+ on the left side.  Patellar reflexes are 1+ on the right side and 1+ on the left side.      Achilles reflexes are 1+ on the right side and 1+ on the left side. Skin: Skin is warm and dry. No rash noted. He is not diaphoretic. No erythema. No pallor.    Psychiatric: He has a normal mood and affect. His behavior is normal. Judgment and thought content normal.          Assessment & Plan:

## 2012-06-19 NOTE — Assessment & Plan Note (Signed)
He is getting relief from the pain with ibuprofen so will continue that for now

## 2012-06-19 NOTE — Patient Instructions (Signed)
Head Injury, Adult  You have had a head injury that does not appear serious at this time. A concussion is a state of changed mental ability, usually from a blow to the head. You should take clear liquids for the rest of the day and then resume your regular diet. You should not take sedatives or alcoholic beverages for as long as directed by your caregiver after discharge. After injuries such as yours, most problems occur within the first 24 hours.  SYMPTOMS  These minor symptoms may be experienced after discharge:   Memory difficulties.   Dizziness.   Headaches.   Double vision.   Hearing difficulties.   Depression.   Tiredness.   Weakness.   Difficulty with concentration.  If you experience any of these problems, you should not be alarmed. A concussion requires a few days for recovery. Many patients with head injuries frequently experience such symptoms. Usually, these problems disappear without medical care. If symptoms last for more than one day, notify your caregiver. See your caregiver sooner if symptoms are becoming worse rather than better.  HOME CARE INSTRUCTIONS    During the next 24 hours you must stay with someone who can watch you for the warning signs listed below.  Although it is unlikely that serious side effects will occur, you should be aware of signs and symptoms which may necessitate your return to this location. Side effects may occur up to 7  10 days following the injury. It is important for you to carefully monitor your condition and contact your caregiver or seek immediate medical attention if there is a change in your condition.  SEEK IMMEDIATE MEDICAL CARE IF:    There is confusion or drowsiness.   You can not awaken the injured person.   There is nausea (feeling sick to your stomach) or continued, forceful vomiting.   You notice dizziness or unsteadiness which is getting worse, or inability to walk.   You have convulsions or unconsciousness.   You experience severe,  persistent headaches not relieved by over-the-counter or prescription medicines for pain. (Do not take aspirin as this impairs clotting abilities). Take other pain medications only as directed.   You can not use arms or legs normally.   There is clear or bloody discharge from the nose or ears.  MAKE SURE YOU:    Understand these instructions.   Will watch your condition.   Will get help right away if you are not doing well or get worse.  Document Released: 08/28/2005 Document Revised: 11/20/2011 Document Reviewed: 07/16/2009  [employer] Patient Information 2013 [employer].

## 2013-01-10 ENCOUNTER — Other Ambulatory Visit: Payer: Self-pay | Admitting: *Deleted

## 2013-01-10 ENCOUNTER — Ambulatory Visit (INDEPENDENT_AMBULATORY_CARE_PROVIDER_SITE_OTHER): Payer: Managed Care, Other (non HMO)

## 2013-01-10 DIAGNOSIS — Z125 Encounter for screening for malignant neoplasm of prostate: Secondary | ICD-10-CM

## 2013-01-10 DIAGNOSIS — Z0000 Encounter for general adult medical examination without abnormal findings: Secondary | ICD-10-CM

## 2013-01-10 LAB — URINALYSIS, ROUTINE W REFLEX MICROSCOPIC
Ketones, ur: NEGATIVE
Specific Gravity, Urine: 1.01 (ref 1.000–1.030)
Urine Glucose: NEGATIVE
Urobilinogen, UA: 0.2 (ref 0.0–1.0)
pH: 6 (ref 5.0–8.0)

## 2013-01-10 LAB — CBC WITH DIFFERENTIAL/PLATELET
Basophils Relative: 0.5 % (ref 0.0–3.0)
Eosinophils Relative: 8.6 % — ABNORMAL HIGH (ref 0.0–5.0)
HCT: 41.7 % (ref 39.0–52.0)
Hemoglobin: 14.3 g/dL (ref 13.0–17.0)
Lymphs Abs: 3.6 10*3/uL (ref 0.7–4.0)
MCV: 91.6 fl (ref 78.0–100.0)
Monocytes Absolute: 0.6 10*3/uL (ref 0.1–1.0)
Neutro Abs: 2.1 10*3/uL (ref 1.4–7.7)
Neutrophils Relative %: 30.3 % — ABNORMAL LOW (ref 43.0–77.0)
RBC: 4.55 Mil/uL (ref 4.22–5.81)
WBC: 6.9 10*3/uL (ref 4.5–10.5)

## 2013-01-13 LAB — HEPATIC FUNCTION PANEL
AST: 33 U/L (ref 0–37)
Albumin: 4.3 g/dL (ref 3.5–5.2)

## 2013-01-13 LAB — BASIC METABOLIC PANEL
BUN: 16 mg/dL (ref 6–23)
GFR: 75.52 mL/min (ref >60.00)
Glucose, Bld: 100 mg/dL — ABNORMAL HIGH (ref 70–99)
Potassium: 4 mEq/L (ref 3.5–5.1)

## 2013-01-13 LAB — LIPID PANEL
Cholesterol: 244 mg/dL — ABNORMAL HIGH (ref 0–200)
VLDL: 13.4 mg/dL (ref 0.0–40.0)

## 2013-01-13 LAB — LDL CHOLESTEROL, DIRECT: Direct LDL: 147.4 mg/dL

## 2013-01-17 ENCOUNTER — Encounter: Payer: Self-pay | Admitting: Internal Medicine

## 2013-01-17 ENCOUNTER — Ambulatory Visit (INDEPENDENT_AMBULATORY_CARE_PROVIDER_SITE_OTHER): Payer: Managed Care, Other (non HMO) | Admitting: Internal Medicine

## 2013-01-17 VITALS — BP 110/70 | HR 61 | Temp 97.2°F | Ht 71.0 in | Wt 211.4 lb

## 2013-01-17 DIAGNOSIS — Z800 Family history of malignant neoplasm of digestive organs: Secondary | ICD-10-CM

## 2013-01-17 DIAGNOSIS — Z0000 Encounter for general adult medical examination without abnormal findings: Secondary | ICD-10-CM

## 2013-01-17 HISTORY — DX: Family history of malignant neoplasm of digestive organs: Z80.0

## 2013-01-17 MED ORDER — MELOXICAM 15 MG PO TABS: 15.0000 mg | ORAL_TABLET | Freq: Every day | ORAL | Status: DC

## 2013-01-17 MED ORDER — ATORVASTATIN CALCIUM 10 MG PO TABS: 10.0000 mg | ORAL_TABLET | Freq: Every day | ORAL | Status: DC

## 2013-01-17 NOTE — Assessment & Plan Note (Addendum)

## 2013-01-17 NOTE — Patient Instructions (Addendum)
Please continue all other medications as before, and refills have been done if requested. Please have the pharmacy call with any other refills you may need. Please continue your efforts at being more active, low cholesterol diet, and weight control. You are otherwise up to date with prevention measures today. Thank you for enrolling in MyChart. Please follow the instructions below to securely access your online medical record. MyChart allows you to send messages to your doctor, view your test results, renew your prescriptions, schedule appointments, and more. To Log into My Chart online, please go by [employer] or [employer] to [email or web address], or download the MyChart App from the [employer] .  Your Username is:  [email or web address] ([email or web address]) Please return in 1 year for your yearly visit, or sooner if needed, with Lab testing done 3-5 days before

## 2013-01-17 NOTE — Progress Notes (Signed)
Subjective:    Patient ID: Matthew Tucker, male    DOB: [birth year], 62 y.o.   MRN: [id number]  HPI  Here for wellness and f/u;  Overall doing ok;  Pt denies CP, worsening SOB, DOE, wheezing, orthopnea, PND, worsening LE edema, palpitations, dizziness or syncope.  Pt denies neurological change such as new headache, facial or extremity weakness.  Pt denies polydipsia, polyuria, or low sugar symptoms. Pt states overall good compliance with treatment and medications, good tolerability, and has been trying to follow lower cholesterol diet.  Pt denies worsening depressive symptoms, suicidal ideation or panic. No fever, night sweats, wt loss, loss of appetite, or other constitutional symptoms.  Pt states good ability with ADL's, has low fall risk, home safety reviewed and adequate, no other significant changes in hearing or vision, and only occasionally active with exercise.  No acute complaints.  Wants to retire next yr, will come back yearly, 47 from [place].   Past Medical History  Diagnosis Date  . Arthritis   . Impaired glucose tolerance 08/29/2011  . Anxiety state, unspecified 09/02/2007  . BACK PAIN, CHRONIC 10/31/2010  . HIP PAIN, RIGHT 12/02/2007  . HYPERLIPIDEMIA 09/02/2007  . Pain in joint, lower leg 12/02/2007  . Pain in Soft Tissues of Limb 12/02/2007  . PARESTHESIA 09/02/2007  . Toxic effect of chlorine gas 10/31/2010  . WEAKNESS, RIGHT SIDE OF BODY 02/28/2010  . Family history of colon cancer 02/16/2013    Father died at 30yo   Past Surgical History  Procedure Laterality Date  . Mass removal      back, forehead; benign  . Mass removal  2011    head; benign  . S/p lipoma right scalp posteriorly  2011    reports that he has quit smoking. He has never used smokeless tobacco. He reports that he does not drink alcohol or use illicit drugs. family history includes Cancer in his mother; Colon cancer (age of onset: 63) in his father; and Stroke (age of onset: 29) in his brother.  There  is no history of Esophageal cancer and Stomach cancer. Allergies  Allergen Reactions  . Levofloxacin     Irritates gums   Current Outpatient Prescriptions on File Prior to Visit  Medication Sig Dispense Refill  . acetaminophen (TYLENOL) 500 MG tablet Take 500 mg by mouth every 6 (six) hours as needed.        Marland Kitchen aspirin 81 MG tablet Take 81 mg by mouth daily.         No current facility-administered medications on file prior to visit.   Review of Systems Constitutional: Negative for diaphoresis, activity change, appetite change or unexpected weight change.  HENT: Negative for hearing loss, ear pain, facial swelling, mouth sores and neck stiffness.   Eyes: Negative for pain, redness and visual disturbance.  Respiratory: Negative for shortness of breath and wheezing.   Cardiovascular: Negative for chest pain and palpitations.  Gastrointestinal: Negative for diarrhea, blood in stool, abdominal distention or other pain Genitourinary: Negative for hematuria, flank pain or change in urine volume.  Musculoskeletal: Negative for myalgias and joint swelling.  Skin: Negative for color change and wound.  Neurological: Negative for syncope and numbness. other than noted Hematological: Negative for adenopathy.  Psychiatric/Behavioral: Negative for hallucinations, self-injury, decreased concentration and agitation.      Objective:   Physical Exam BP 110/70  Pulse 61  Temp(Src) 97.2 F (36.2 C) (Oral)  Ht 5\' 11"  (1.803 m)  Wt 211 lb 6 oz (95.879  kg)  BMI 29.49 kg/m2  SpO2 97% VS noted,  Constitutional: Pt is oriented to person, place, and time. Appears well-developed and well-nourished.  Head: Normocephalic and atraumatic.  Right Ear: External ear normal.  Left Ear: External ear normal.  Nose: Nose normal.  Mouth/Throat: Oropharynx is clear and moist.  Eyes: Conjunctivae and EOM are normal. Pupils are equal, round, and reactive to light.  Neck: Normal range of motion. Neck supple. No JVD  present. No tracheal deviation present.  Cardiovascular: Normal rate, regular rhythm, normal heart sounds and intact distal pulses.   Pulmonary/Chest: Effort normal and breath sounds normal.  Abdominal: Soft. Bowel sounds are normal. There is no tenderness. No HSM  Musculoskeletal: Normal range of motion. Exhibits no edema.  Lymphadenopathy:  Has no cervical adenopathy.  Neurological: Pt is alert and oriented to person, place, and time. Pt has normal reflexes. No cranial nerve deficit.  Skin: Skin is warm and dry. No rash noted.  Psychiatric:  Has  normal mood and affect. Behavior is normal.     Assessment & Plan:

## 2013-07-17 ENCOUNTER — Other Ambulatory Visit: Payer: Self-pay

## 2014-01-23 ENCOUNTER — Ambulatory Visit (INDEPENDENT_AMBULATORY_CARE_PROVIDER_SITE_OTHER): Payer: Managed Care, Other (non HMO) | Admitting: Internal Medicine

## 2014-01-23 ENCOUNTER — Other Ambulatory Visit (INDEPENDENT_AMBULATORY_CARE_PROVIDER_SITE_OTHER): Payer: Managed Care, Other (non HMO)

## 2014-01-23 ENCOUNTER — Encounter: Payer: Self-pay | Admitting: Internal Medicine

## 2014-01-23 VITALS — BP 120/80 | HR 64 | Temp 98.2°F | Ht 71.0 in | Wt 212.0 lb

## 2014-01-23 DIAGNOSIS — R7302 Impaired glucose tolerance (oral): Secondary | ICD-10-CM

## 2014-01-23 DIAGNOSIS — Z0000 Encounter for general adult medical examination without abnormal findings: Secondary | ICD-10-CM

## 2014-01-23 DIAGNOSIS — R7309 Other abnormal glucose: Secondary | ICD-10-CM

## 2014-01-23 LAB — URINALYSIS, ROUTINE W REFLEX MICROSCOPIC
BILIRUBIN URINE: NEGATIVE
Ketones, ur: NEGATIVE
Leukocytes, UA: NEGATIVE
Nitrite: NEGATIVE
Specific Gravity, Urine: 1.025 (ref 1.000–1.030)
TOTAL PROTEIN, URINE-UPE24: NEGATIVE
UROBILINOGEN UA: 0.2 (ref 0.0–1.0)
Urine Glucose: NEGATIVE
pH: 5.5 (ref 5.0–8.0)

## 2014-01-23 LAB — CBC WITH DIFFERENTIAL/PLATELET
Basophils Absolute: 0 10*3/uL (ref 0.0–0.1)
Basophils Relative: 0.5 % (ref 0.0–3.0)
EOS ABS: 0.8 10*3/uL — AB (ref 0.0–0.7)
Eosinophils Relative: 9.8 % — ABNORMAL HIGH (ref 0.0–5.0)
HCT: 43.5 % (ref 39.0–52.0)
Hemoglobin: 14.9 g/dL (ref 13.0–17.0)
LYMPHS PCT: 38.3 % (ref 12.0–46.0)
Lymphs Abs: 3.2 10*3/uL (ref 0.7–4.0)
MCHC: 34.3 g/dL (ref 30.0–36.0)
MCV: 93.5 fl (ref 78.0–100.0)
Monocytes Absolute: 0.7 10*3/uL (ref 0.1–1.0)
Monocytes Relative: 8.1 % (ref 3.0–12.0)
NEUTROS PCT: 43.3 % (ref 43.0–77.0)
Neutro Abs: 3.6 10*3/uL (ref 1.4–7.7)
PLATELETS: 205 10*3/uL (ref 150.0–400.0)
RBC: 4.66 Mil/uL (ref 4.22–5.81)
RDW: 13 % (ref 11.5–15.5)
WBC: 8.3 10*3/uL (ref 4.0–10.5)

## 2014-01-23 LAB — HEPATIC FUNCTION PANEL
ALK PHOS: 88 U/L (ref 39–117)
ALT: 23 U/L (ref 0–53)
AST: 27 U/L (ref 0–37)
Albumin: 3.9 g/dL (ref 3.5–5.2)
BILIRUBIN DIRECT: 0 mg/dL (ref 0.0–0.3)
TOTAL PROTEIN: 6.4 g/dL (ref 6.0–8.3)
Total Bilirubin: 0.7 mg/dL (ref 0.2–1.2)

## 2014-01-23 LAB — BASIC METABOLIC PANEL
BUN: 19 mg/dL (ref 6–23)
CHLORIDE: 105 meq/L (ref 96–112)
CO2: 28 meq/L (ref 19–32)
Calcium: 9.4 mg/dL (ref 8.4–10.5)
Creatinine, Ser: 1.4 mg/dL (ref 0.4–1.5)
GFR: 67.1 mL/min (ref >60.00)
Glucose, Bld: 91 mg/dL (ref 70–99)
Potassium: 4.1 mEq/L (ref 3.5–5.1)
Sodium: 142 mEq/L (ref 135–145)

## 2014-01-23 LAB — LIPID PANEL
Cholesterol: 229 mg/dL — ABNORMAL HIGH (ref 0–200)
HDL: 65.9 mg/dL (ref >39.00)
LDL Cholesterol: 91 mg/dL (ref 0–99)
TRIGLYCERIDES: 363 mg/dL — AB (ref 0.0–149.0)
Total CHOL/HDL Ratio: 3
VLDL: 72.6 mg/dL — ABNORMAL HIGH (ref 0.0–40.0)

## 2014-01-23 LAB — PSA: PSA: 1.14 ng/mL (ref 0.10–4.00)

## 2014-01-23 LAB — TSH: TSH: 2.94 u[IU]/mL (ref 0.35–4.50)

## 2014-01-23 LAB — HEMOGLOBIN A1C: Hgb A1c MFr Bld: 5.4 % (ref 4.6–6.5)

## 2014-01-23 NOTE — Assessment & Plan Note (Signed)

## 2014-01-23 NOTE — Progress Notes (Signed)
Pre visit review using our clinic review tool, if applicable. No additional management support is needed unless otherwise documented below in the visit note. 

## 2014-01-23 NOTE — Patient Instructions (Addendum)
Please continue all other medications as before, and refills have been done if requested. Please have the pharmacy call with any other refills you may need.  Please continue your efforts at being more active, low cholesterol diet, and weight control. You are otherwise up to date with prevention measures today.  Please keep your appointments with your specialists as you may have planned  Please call [employer] to see what your copay would be for the Shingles shot. Make a Nurse Visit appt at any time before 65yo Bday to have this done.  Please go to the LAB in the Basement (turn left off the elevator) for the tests to be done today  You will be contacted by phone if any changes need to be made immediately.  Otherwise, you will receive a letter about your results with an explanation, but please check with MyChart first.  Please remember to sign up for MyChart if you have not done so, as this will be important to you in the future with finding out test results, communicating by private email, and scheduling acute appointments online when needed.  Please return in 1 year for your yearly visit, or sooner if needed

## 2014-01-23 NOTE — Assessment & Plan Note (Signed)
stable overall by history and exam, recent data reviewed with pt, and pt to continue medical treatment as before,  to f/u any worsening symptoms or concerns Lab Results  Component Value Date   WBC 6.9 01/10/2013   HGB 14.3 01/10/2013   HCT 41.7 01/10/2013   PLT 195.0 01/10/2013   GLUCOSE 100* 01/10/2013   CHOL 244* 01/10/2013   TRIG 67.0 01/10/2013   HDL 78.60 01/10/2013   LDLDIRECT 147.4 01/10/2013   ALT 24 01/10/2013   AST 33 01/10/2013   NA 141 01/10/2013   K 4.0 01/10/2013   CL 104 01/10/2013   CREATININE 1.2 01/10/2013   BUN 16 01/10/2013   CO2 30 01/10/2013   TSH 2.02 01/10/2013   PSA 0.75 01/10/2013

## 2014-01-23 NOTE — Progress Notes (Signed)
Subjective:    Patient ID: Matthew Tucker, male    DOB: Aug 27, 1951, 63 y.o.   MRN: [id number]  HPI  Here for wellness and f/u;  Overall doing ok;  Pt denies CP, worsening SOB, DOE, wheezing, orthopnea, PND, worsening LE edema, palpitations, dizziness or syncope.  Pt denies neurological change such as new headache, facial or extremity weakness.  Pt denies polydipsia, polyuria, or low sugar symptoms. Pt states overall good compliance with treatment and medications, good tolerability, and has been trying to follow lower cholesterol diet.  Pt denies worsening depressive symptoms, suicidal ideation or panic. No fever, night sweats, wt loss, loss of appetite, or other constitutional symptoms.  Pt states good ability with ADL's, has low fall risk, home safety reviewed and adequate, no other significant changes in hearing or vision, and only occasionally active with exercise. Retiring July 15, wife works with HR. Past Medical History  Diagnosis Date  . Arthritis   . Impaired glucose tolerance 08/29/2011  . Anxiety state, unspecified 09/02/2007  . BACK PAIN, CHRONIC 10/31/2010  . HIP PAIN, RIGHT 12/02/2007  . HYPERLIPIDEMIA 09/02/2007  . Pain in joint, lower leg 12/02/2007  . Pain in Soft Tissues of Limb 12/02/2007  . PARESTHESIA 09/02/2007  . Toxic effect of chlorine gas 10/31/2010  . WEAKNESS, RIGHT SIDE OF BODY 02/28/2010  . Family history of colon cancer Jan 22, 2013    Father died at 29yo   Past Surgical History  Procedure Laterality Date  . Mass removal      back, forehead; benign  . Mass removal  2011    head; benign  . S/p lipoma right scalp posteriorly  2011    reports that he has quit smoking. He has never used smokeless tobacco. He reports that he does not drink alcohol or use illicit drugs. family history includes Cancer in his mother; Colon cancer (age of onset: 24) in his father; Stroke (age of onset: 54) in his brother. There is no history of Esophageal cancer or Stomach  cancer. Allergies  Allergen Reactions  . Levofloxacin     Irritates gums   Current Outpatient Prescriptions on File Prior to Visit  Medication Sig Dispense Refill  . acetaminophen (TYLENOL) 500 MG tablet Take 500 mg by mouth every 6 (six) hours as needed.        Marland Kitchen aspirin 81 MG tablet Take 81 mg by mouth daily.        Marland Kitchen atorvastatin (LIPITOR) 10 MG tablet Take 1 tablet (10 mg total) by mouth daily.  90 tablet  3  . meloxicam (MOBIC) 15 MG tablet Take 1 tablet (15 mg total) by mouth daily.  90 tablet  3   No current facility-administered medications on file prior to visit.    Review of Systems Constitutional: Negative for increased diaphoresis, other activity, appetite or other siginficant weight change  HENT: Negative for worsening hearing loss, ear pain, facial swelling, mouth sores and neck stiffness.   Eyes: Negative for other worsening pain, redness or visual disturbance.  Respiratory: Negative for shortness of breath and wheezing.   Cardiovascular: Negative for chest pain and palpitations.  Gastrointestinal: Negative for diarrhea, blood in stool, abdominal distention or other pain Genitourinary: Negative for hematuria, flank pain or change in urine volume.  Musculoskeletal: Negative for myalgias or other joint complaints.  Skin: Negative for color change and wound.  Neurological: Negative for syncope and numbness. other than noted Hematological: Negative for adenopathy. or other swelling Psychiatric/Behavioral: Negative for hallucinations, self-injury, decreased concentration or  other worsening agitation.      Objective:   Physical Exam BP 120/80  Pulse 64  Temp(Src) 98.2 F (36.8 C) (Oral)  Ht 5\' 11"  (1.803 m)  Wt 212 lb (96.163 kg)  BMI 29.58 kg/m2  SpO2 98% VS noted,  Constitutional: Pt is oriented to person, place, and time. Appears well-developed and well-nourished.  Head: Normocephalic and atraumatic.  Right Ear: External ear normal.  Left Ear: External ear  normal.  Nose: Nose normal.  Mouth/Throat: Oropharynx is clear and moist.  Eyes: Conjunctivae and EOM are normal. Pupils are equal, round, and reactive to light.  Neck: Normal range of motion. Neck supple. No JVD present. No tracheal deviation present.  Cardiovascular: Normal rate, regular rhythm, normal heart sounds and intact distal pulses.   Pulmonary/Chest: Effort normal and breath sounds without rales or wheezing  Abdominal: Soft. Bowel sounds are normal. NT. No HSM  Musculoskeletal: Normal range of motion. Exhibits no edema.  Lymphadenopathy:  Has no cervical adenopathy.  Neurological: Pt is alert and oriented to person, place, and time. Pt has normal reflexes. No cranial nerve deficit. Motor grossly intact Skin: Skin is warm and dry. No rash noted.  Psychiatric:  Has normal mood and affect. Behavior is normal.     Assessment & Plan:

## 2014-02-05 ENCOUNTER — Ambulatory Visit (INDEPENDENT_AMBULATORY_CARE_PROVIDER_SITE_OTHER): Payer: Managed Care, Other (non HMO)

## 2014-02-05 DIAGNOSIS — Z23 Encounter for immunization: Secondary | ICD-10-CM

## 2014-02-05 DIAGNOSIS — Z2911 Encounter for prophylactic immunotherapy for respiratory syncytial virus (RSV): Secondary | ICD-10-CM

## 2014-04-13 ENCOUNTER — Encounter (HOSPITAL_COMMUNITY): Payer: Self-pay | Admitting: Emergency Medicine

## 2014-04-13 ENCOUNTER — Emergency Department (HOSPITAL_COMMUNITY): Payer: Medicare Other

## 2014-04-13 ENCOUNTER — Observation Stay (HOSPITAL_COMMUNITY)
Admission: EM | Admit: 2014-04-13 | Discharge: 2014-04-13 | Disposition: A | Discharge status: Home / Self Care | Payer: Medicare Other | Attending: Emergency Medicine | Admitting: Emergency Medicine

## 2014-04-13 DIAGNOSIS — Z7982 Long term (current) use of aspirin: Secondary | ICD-10-CM | POA: Insufficient documentation

## 2014-04-13 DIAGNOSIS — R55 Syncope and collapse: Secondary | ICD-10-CM | POA: Diagnosis not present

## 2014-04-13 DIAGNOSIS — R61 Generalized hyperhidrosis: Secondary | ICD-10-CM | POA: Diagnosis not present

## 2014-04-13 DIAGNOSIS — R404 Transient alteration of awareness: Secondary | ICD-10-CM | POA: Diagnosis not present

## 2014-04-13 DIAGNOSIS — I951 Orthostatic hypotension: Secondary | ICD-10-CM | POA: Diagnosis not present

## 2014-04-13 DIAGNOSIS — Z87891 Personal history of nicotine dependence: Secondary | ICD-10-CM | POA: Insufficient documentation

## 2014-04-13 DIAGNOSIS — E860 Dehydration: Secondary | ICD-10-CM | POA: Insufficient documentation

## 2014-04-13 DIAGNOSIS — R9431 Abnormal electrocardiogram [ECG] [EKG]: Secondary | ICD-10-CM | POA: Diagnosis present

## 2014-04-13 DIAGNOSIS — I959 Hypotension, unspecified: Secondary | ICD-10-CM | POA: Diagnosis not present

## 2014-04-13 LAB — COMPREHENSIVE METABOLIC PANEL
ALT: 18 U/L (ref 0–53)
ANION GAP: 13 (ref 5–15)
AST: 21 U/L (ref 0–37)
Albumin: 3.1 g/dL — ABNORMAL LOW (ref 3.5–5.2)
Alkaline Phosphatase: 76 U/L (ref 39–117)
BILIRUBIN TOTAL: 0.7 mg/dL (ref 0.3–1.2)
BUN: 15 mg/dL (ref 6–23)
CALCIUM: 8.3 mg/dL — AB (ref 8.4–10.5)
CO2: 23 meq/L (ref 19–32)
CREATININE: 1.52 mg/dL — AB (ref 0.50–1.35)
Chloride: 104 mEq/L (ref 96–112)
GFR calc Af Amer: 54 mL/min — ABNORMAL LOW (ref >90)
GFR, EST NON AFRICAN AMERICAN: 46 mL/min — AB (ref >90)
GLUCOSE: 163 mg/dL — AB (ref 70–99)
Potassium: 3.7 mEq/L (ref 3.7–5.3)
Sodium: 140 mEq/L (ref 137–147)
Total Protein: 6.3 g/dL (ref 6.0–8.3)

## 2014-04-13 LAB — CBC WITH DIFFERENTIAL/PLATELET
Basophils Absolute: 0 10*3/uL (ref 0.0–0.1)
Basophils Relative: 0 % (ref 0–1)
EOS PCT: 3 % (ref 0–5)
Eosinophils Absolute: 0.4 10*3/uL (ref 0.0–0.7)
HEMATOCRIT: 37.8 % — AB (ref 39.0–52.0)
HEMOGLOBIN: 12.6 g/dL — AB (ref 13.0–17.0)
LYMPHS ABS: 2.3 10*3/uL (ref 0.7–4.0)
LYMPHS PCT: 19 % (ref 12–46)
MCH: 30.2 pg (ref 26.0–34.0)
MCHC: 33.3 g/dL (ref 30.0–36.0)
MCV: 90.6 fL (ref 78.0–100.0)
MONO ABS: 0.8 10*3/uL (ref 0.1–1.0)
MONOS PCT: 7 % (ref 3–12)
Neutro Abs: 8.7 10*3/uL — ABNORMAL HIGH (ref 1.7–7.7)
Neutrophils Relative %: 71 % (ref 43–77)
Platelets: 178 10*3/uL (ref 150–400)
RBC: 4.17 MIL/uL — ABNORMAL LOW (ref 4.22–5.81)
RDW: 12.1 % (ref 11.5–15.5)
WBC: 12.1 10*3/uL — AB (ref 4.0–10.5)

## 2014-04-13 LAB — URINALYSIS, ROUTINE W REFLEX MICROSCOPIC
Bilirubin Urine: NEGATIVE
Glucose, UA: NEGATIVE mg/dL
KETONES UR: NEGATIVE mg/dL
LEUKOCYTES UA: NEGATIVE
NITRITE: NEGATIVE
PROTEIN: NEGATIVE mg/dL
Specific Gravity, Urine: 1.012 (ref 1.005–1.030)
Urobilinogen, UA: 0.2 mg/dL (ref 0.0–1.0)
pH: 6.5 (ref 5.0–8.0)

## 2014-04-13 LAB — URINE MICROSCOPIC-ADD ON

## 2014-04-13 LAB — TROPONIN I

## 2014-04-13 LAB — RAPID URINE DRUG SCREEN, HOSP PERFORMED
Amphetamines: NOT DETECTED
BARBITURATES: NOT DETECTED
Benzodiazepines: NOT DETECTED
Cocaine: NOT DETECTED
Opiates: NOT DETECTED
Tetrahydrocannabinol: NOT DETECTED

## 2014-04-13 LAB — CK: CK TOTAL: 243 U/L — AB (ref 7–232)

## 2014-04-13 MED ORDER — SODIUM CHLORIDE 0.9 % IV BOLUS (SEPSIS)
1000.0000 mL | Freq: Once | INTRAVENOUS | Status: AC
  Administered 2014-04-13: 1000 mL via INTRAVENOUS

## 2014-04-13 MED ORDER — SODIUM CHLORIDE 0.9 % IV SOLN: INTRAVENOUS | Status: DC

## 2014-04-13 NOTE — ED Provider Notes (Signed)
CSN: [id number]     Arrival date & time 04/13/14  1625 History   First MD Initiated Contact with Patient 04/13/14 1635     Chief Complaint  Patient presents with  . Loss of Consciousness     (Consider location/radiation/quality/duration/timing/severity/associated sxs/prior Treatment) HPI Comments: Patient presents by EMS after syncopal episode. He was picking up his car at the auto shop and remembers feeling hot, lightheaded and nauseated. He went to sit down and air conditioning. He did not feel any better so he laid on the floor. He was found unresponsive by staff. EMS was called. He was slightly diaphoretic and confused. He was initially hypotensive to the 80s. EKG showed ST depressions in inferior leads. Patient now feeling better after 100 cc of fluid. Denies any head, neck, back, chest or abdominal pain. Admits to poor by mouth intake today. He did have 2 shots of liquor earlier this morning. He was out in the heat for about an hour prior to going to the auto shop. Denies any cardiac history. Denies any daily medication use. He is not diabetic. CBG 109  The history is provided by the patient and the EMS personnel.    Past Medical History  Diagnosis Date  . Arthritis   . Impaired glucose tolerance 08/29/2011  . Anxiety state, unspecified 09/02/2007  . BACK PAIN, CHRONIC 10/31/2010  . HIP PAIN, RIGHT 12/02/2007  . HYPERLIPIDEMIA 09/02/2007  . Pain in joint, lower leg 12/02/2007  . Pain in Soft Tissues of Limb 12/02/2007  . PARESTHESIA 09/02/2007  . Toxic effect of chlorine gas(987.6) 10/31/2010  . WEAKNESS, RIGHT SIDE OF BODY 02/28/2010  . Family history of colon cancer Jan 19, 2013    Father died at 45yo   Past Surgical History  Procedure Laterality Date  . Mass removal      back, forehead; benign  . Mass removal  2011    head; benign  . S/p lipoma right scalp posteriorly  2011   Family History  Problem Relation Age of Onset  . Colon cancer Father 55  . Esophageal cancer Neg Hx    . Stomach cancer Neg Hx   . Cancer Mother     colon cancer  . Stroke Brother 80   History  Substance Use Topics  . Smoking status: Former Research scientist (life sciences)  . Smokeless tobacco: Never Used  . Alcohol Use: No     Comment: occasional    Review of Systems  Constitutional: Positive for diaphoresis, activity change, appetite change and fatigue. Negative for fever.  Respiratory: Negative for cough, chest tightness and shortness of breath.   Cardiovascular: Positive for syncope. Negative for chest pain.  Gastrointestinal: Positive for nausea. Negative for vomiting and abdominal pain.  Genitourinary: Negative for dysuria and hematuria.  Musculoskeletal: Positive for arthralgias and myalgias.  Skin: Negative for rash.  Neurological: Positive for dizziness, weakness and light-headedness. Negative for headaches.  A complete 10 system review of systems was obtained and all systems are negative except as noted in the HPI and PMH.      Allergies  Levofloxacin  Home Medications   Prior to Admission medications   Medication Sig Start Date End Date Taking? Authorizing Provider  aspirin EC 81 MG tablet Take 81 mg by mouth daily.   Yes Historical Provider, MD  ibuprofen (ADVIL,MOTRIN) 200 MG tablet Take 400-600 mg by mouth every 6 (six) hours as needed for moderate pain.   Yes Historical Provider, MD   BP 145/76  Pulse 75  Temp(Src) 98.7 F (37.1  C) (Rectal)  Resp 23  SpO2 98% Physical Exam  Nursing note and vitals reviewed. Constitutional: He is oriented to person, place, and time. He appears well-developed and well-nourished. No distress.  HENT:  Head: Normocephalic and atraumatic.  Mouth/Throat: Oropharynx is clear and moist. No oropharyngeal exudate.  Eyes: Conjunctivae and EOM are normal. Pupils are equal, round, and reactive to light.  Neck: Normal range of motion. Neck supple.  No C-spine pain  Cardiovascular: Normal rate, regular rhythm, normal heart sounds and intact distal pulses.    No murmur heard. Pulmonary/Chest: Effort normal and breath sounds normal. No respiratory distress.  Abdominal: Soft. There is no tenderness. There is no rebound and no guarding.  Musculoskeletal: Normal range of motion. He exhibits no edema and no tenderness.  Neurological: He is alert and oriented to person, place, and time. No cranial nerve deficit. He exhibits normal muscle tone. Coordination normal.  No ataxia on finger to nose bilaterally. No pronator drift. 5/5 strength throughout. CN 2-12 intact. Negative Romberg. Equal grip strength. Sensation intact. Gait is normal.   Skin: Skin is warm.  Psychiatric: He has a normal mood and affect. His behavior is normal.    ED Course  Procedures (including critical care time) Labs Review Labs Reviewed  CBC WITH DIFFERENTIAL - Abnormal; Notable for the following:    WBC 12.1 (*)    RBC 4.17 (*)    Hemoglobin 12.6 (*)    HCT 37.8 (*)    Neutro Abs 8.7 (*)    All other components within normal limits  COMPREHENSIVE METABOLIC PANEL - Abnormal; Notable for the following:    Glucose, Bld 163 (*)    Creatinine, Ser 1.52 (*)    Calcium 8.3 (*)    Albumin 3.1 (*)    GFR calc non Af Amer 46 (*)    GFR calc Af Amer 54 (*)    All other components within normal limits  URINALYSIS, ROUTINE W REFLEX MICROSCOPIC - Abnormal; Notable for the following:    Hgb urine dipstick SMALL (*)    All other components within normal limits  CK - Abnormal; Notable for the following:    Total CK 243 (*)    All other components within normal limits  TROPONIN I  URINE RAPID DRUG SCREEN (HOSP PERFORMED)  URINE MICROSCOPIC-ADD ON    Imaging Review Dg Chest 2 View  04/13/2014   CLINICAL DATA:  Loss of consciousness, dizziness  EXAM: CHEST  2 VIEW  COMPARISON:  None.  FINDINGS: The heart size is mildly enlarged with unfolding and ectasia of the aorta. Both lungs are clear. The visualized skeletal structures are unremarkable.  IMPRESSION: No active cardiopulmonary  disease.   Electronically Signed   By: Conchita Paris M.D.   On: 04/13/2014 18:33     EKG Interpretation   Date/Time:  Monday April 13 2014 16:44:42 EDT Ventricular Rate:  65 PR Interval:  166 QRS Duration: 88 QT Interval:  410 QTC Calculation: 426 R Axis:   68 Text Interpretation:  Sinus rhythm Borderline T abnormalities, diffuse  leads No significant change was found Confirmed by Wyvonnia Dusky  MD, Jenniah Bhavsar  [phone]) on 04/13/2014 4:53:49 PM      MDM   Final diagnoses:  Syncope and collapse  EKG abnormalities   syncopal episode with prodrome of dizziness, lightheadedness, nausea. ST depressions initial EKG with hypotension. No chest pain or shortness of breath. EKG here with inferior lateral T wave inversions which are actually unchanged from 2011.  Asymptomatic in the  ED. Orthostatics positive by heart rate which elevates 89 with standing. IV fluids given.  Creatinine 1.5. Baseline appears to be 1.2.   Is given IV fluids and by mouth fluids in the ED. He feels much improved. He is ambulatory without a problem.  Record review shows patient has had ST depressions and T wave inversions in inferior lateral leads since 2010. Patient saw a cardiologist just and was told there was nothing wrong. Echocardiogram in 2007 did not show any structural abnormalities that were significant.   Dr. Alcario Drought has seen patient and offered overnight observation. Patient feels fine and wishes to go home. He appears to have had orthostatic syncope. His EKG is unchanged.   His EKG appears essentially unchanged since 2010. Echocardiogram 2007 was normal. Patient states that cardiologist and was told of the spine. Source of the syncope today is likely orthostasis or dehydration poor by mouth intake. Patient wishes to go home. Dr. Alcario Drought feels this is reasonable as patient's EKG is unchanged in previous cardiac workup has been negative. Follow up with PCP. Return precautions discussed.  BP 145/76  Pulse 75   Temp(Src) 98.7 F (37.1 C) (Rectal)  Resp 23  SpO2 98%   Ezequiel Essex, MD 04/13/14 2253

## 2014-04-13 NOTE — ED Notes (Signed)
Pt returned from xray

## 2014-04-13 NOTE — Discharge Instructions (Signed)
Syncope Keep herself hydrated. Followup with her doctor. Return to the ED if you develop new or worsening symptoms. Syncope is a medical term for fainting or passing out. This means you lose consciousness and drop to the ground. People are generally unconscious for less than 5 minutes. You may have some muscle twitches for up to 15 seconds before waking up and returning to normal. Syncope occurs more often in older adults, but it can happen to anyone. While most causes of syncope are not dangerous, syncope can be a sign of a serious medical problem. It is important to seek medical care.  CAUSES  Syncope is caused by a sudden drop in blood flow to the brain. The specific cause is often not determined. Factors that can bring on syncope include:  Taking medicines that lower blood pressure.  Sudden changes in posture, such as standing up quickly.  Taking more medicine than prescribed.  Standing in one place for too long.  Seizure disorders.  Dehydration and excessive exposure to heat.  Low blood sugar (hypoglycemia).  Straining to have a bowel movement.  Heart disease, irregular heartbeat, or other circulatory problems.  Fear, emotional distress, seeing blood, or severe pain. SYMPTOMS  Right before fainting, you may:  Feel dizzy or light-headed.  Feel nauseous.  See all white or all black in your field of vision.  Have cold, clammy skin. DIAGNOSIS  Your health care provider will ask about your symptoms, perform a physical exam, and perform an electrocardiogram (ECG) to record the electrical activity of your heart. Your health care provider may also perform other heart or blood tests to determine the cause of your syncope which may include:  Transthoracic echocardiogram (TTE). During echocardiography, sound waves are used to evaluate how blood flows through your heart.  Transesophageal echocardiogram (TEE).  Cardiac monitoring. This allows your health care provider to monitor  your heart rate and rhythm in real time.  Holter monitor. This is a portable device that records your heartbeat and can help diagnose heart arrhythmias. It allows your health care provider to track your heart activity for several days, if needed.  Stress tests by exercise or by giving medicine that makes the heart beat faster. TREATMENT  In most cases, no treatment is needed. Depending on the cause of your syncope, your health care provider may recommend changing or stopping some of your medicines. HOME CARE INSTRUCTIONS  Have someone stay with you until you feel stable.  Do not drive, use machinery, or play sports until your health care provider says it is okay.  Keep all follow-up appointments as directed by your health care provider.  Lie down right away if you start feeling like you might faint. Breathe deeply and steadily. Wait until all the symptoms have passed.  Drink enough fluids to keep your urine clear or pale yellow.  If you are taking blood pressure or heart medicine, get up slowly and take several minutes to sit and then stand. This can reduce dizziness. SEEK IMMEDIATE MEDICAL CARE IF:   You have a severe headache.  You have unusual pain in the chest, abdomen, or back.  You are bleeding from your mouth or rectum, or you have black or tarry stool.  You have an irregular or very fast heartbeat.  You have pain with breathing.  You have repeated fainting or seizure-like jerking during an episode.  You faint when sitting or lying down.  You have confusion.  You have trouble walking.  You have severe weakness.  You have vision problems. If you fainted, call your local emergency services (911 in [place] Do not drive yourself to the hospital.  MAKE SURE YOU:  Understand these instructions.  Will watch your condition.  Will get help right away if you are not doing well or get worse. Document Released: 08/28/2005 Document Revised: 09/02/2013 Document Reviewed:  10/27/2011 [facility] Patient Information 2015 [place], [place]. This information is not intended to replace advice given to you by your health care provider. Make sure you discuss any questions you have with your health care provider.

## 2014-04-13 NOTE — ED Notes (Signed)
Per [facility], 800ccs of fluid, pt went to go pick up a car from the shop, stated he wasn't feeling well and went inside to get some air conditioning. Employees went inside and found him on the floor unconscious. Pt states he remembers lying himself down on the floor. Fire dept arrived pt was diaphoretic, pt had no idea where he was, who he was. Pt placed in trendelenburg, given IV fluids, pt feeling  Much better, Pt is AAOX4 upon arrival to ED. Initial EKG showed ST depression in inferior leads, right sided EKG unremarkable. Denies pain. CBG 109.

## 2014-04-13 NOTE — Consult Note (Signed)
Matthew Tucker Consultation  Matthew Tucker:[id number] DOB: [birth year] DOA: 04/13/2014 PCP: Cathlean Cower, MD   Requesting physician: Dr. Wyvonnia Dusky Date of consultation: 04/13/14 Reason for consultation: Syncope  Impression/Recommendations Principal Problem:   Syncope Active Problems:   EKG abnormality    1. Syncope - due to orthostatic hypotension, and dehydration in setting of overheating today.  Patient significantly improved with IVF in ED and feels well at this time.  Observation admission offered to patient but patient would prefer to go home.  At this point, we don't really have a hard and fast reason to insist further on the admission. 2. EKG abnormality - discussed T wave inversions with patient, these appear old, unchanged, and are on all prior EKGs we are able to pull up which go back as far as 2010.  Suspect (although I cannot prove) that this was initially found in 2007 and patient was sent to cardiologist at that time who performed work up and found nothing else of concern.  Echo in 2007 didn't show any structural abnormalities, the patient has no anginal symptoms now or recently.  I will followup again tomorrow. Please contact me if I can be of assistance in the meanwhile. Thank you for this consultation.  Chief Complaint: Synocpe  HPI:  63 yo M who was looking for parts for his car in a junk yard today, and became lightheaded due to the extreme heat outside.  He went to sit down in the air condition of the shop, didn't feel better so he laid down on the floor.  He was found unconscious by staff.  EMS was called, slightly diaphoretic and confused on arrival.  Initially hypotensive into the 80s.  Patient improved significantly en route to ED with IVF.  Noted to be orthostatic in ED.  Admits to essentially no PO intake of fluids today.  Review of Systems:  12 systems reviewed and otherwise negative.  Past Medical History  Diagnosis Date  . Arthritis   . Impaired  glucose tolerance 08/29/2011  . Anxiety state, unspecified 09/02/2007  . BACK PAIN, CHRONIC 10/31/2010  . HIP PAIN, RIGHT 12/02/2007  . HYPERLIPIDEMIA 09/02/2007  . Pain in joint, lower leg 12/02/2007  . Pain in Soft Tissues of Limb 12/02/2007  . PARESTHESIA 09/02/2007  . Toxic effect of chlorine gas(987.6) 10/31/2010  . WEAKNESS, RIGHT SIDE OF BODY 02/28/2010  . Family history of colon cancer February 16, 2013    Father died at 70yo   Past Surgical History  Procedure Laterality Date  . Mass removal      back, forehead; benign  . Mass removal  2011    head; benign  . S/p lipoma right scalp posteriorly  2011   Social History:  reports that he has quit smoking. He has never used smokeless tobacco. He reports that he does not drink alcohol or use illicit drugs.  Allergies  Allergen Reactions  . Levofloxacin Other (See Comments)    Irritates gums   Family History  Problem Relation Age of Onset  . Colon cancer Father 68  . Esophageal cancer Neg Hx   . Stomach cancer Neg Hx   . Cancer Mother     colon cancer  . Stroke Brother 54    Prior to Admission medications   Medication Sig Start Date End Date Taking? Authorizing Provider  aspirin EC 81 MG tablet Take 81 mg by mouth daily.   Yes Historical Provider, MD  ibuprofen (ADVIL,MOTRIN) 200 MG tablet Take 400-600 mg by mouth every  6 (six) hours as needed for moderate pain.   Yes Historical Provider, MD   Physical Exam: Blood pressure 140/80, pulse 73, temperature 98.7 F (37.1 C), temperature source Rectal, resp. rate 21, SpO2 98.00%. Filed Vitals:   04/13/14 1830  BP: 140/80  Pulse: 73  Temp:   Resp: 21    General:  NAD, resting comfortably in bed Eyes: PEERLA EOMI ENT: mucous membranes moist Neck: supple w/o JVD Cardiovascular: RRR w/o MRG Respiratory: CTA B Abdomen: soft, nt, nd, bs+ Skin: no rash nor lesion Musculoskeletal: MAE, full ROM all 4 extremities Psychiatric: normal tone and affect Neurologic: AAOx3, grossly  non-focal  Labs on Admission:  Basic Metabolic Panel:  Recent Labs Lab 04/13/14 1645  NA 140  K 3.7  CL 104  CO2 23  GLUCOSE 163*  BUN 15  CREATININE 1.52*  CALCIUM 8.3*   Liver Function Tests:  Recent Labs Lab 04/13/14 1645  AST 21  ALT 18  ALKPHOS 76  BILITOT 0.7  PROT 6.3  ALBUMIN 3.1*   No results found for this basename: LIPASE, AMYLASE,  in the last 168 hours No results found for this basename: AMMONIA,  in the last 168 hours CBC:  Recent Labs Lab 04/13/14 1645  WBC 12.1*  NEUTROABS 8.7*  HGB 12.6*  HCT 37.8*  MCV 90.6  PLT 178   Cardiac Enzymes:  Recent Labs Lab 04/13/14 1645  CKTOTAL 243*  TROPONINI <0.30   BNP: No components found with this basename: POCBNP,  CBG: No results found for this basename: GLUCAP,  in the last 168 hours  Radiological Exams on Admission: Dg Chest 2 View  04/13/2014   CLINICAL DATA:  Loss of consciousness, dizziness  EXAM: CHEST  2 VIEW  COMPARISON:  None.  FINDINGS: The heart size is mildly enlarged with unfolding and ectasia of the aorta. Both lungs are clear. The visualized skeletal structures are unremarkable.  IMPRESSION: No active cardiopulmonary disease.   Electronically Signed   By: Conchita Paris M.D.   On: 04/13/2014 18:33    EKG: Independently reviewed.  Time spent: 60 min  Harpreet Signore M. [facility]  Pager [phone]  If 7PM-7AM, please contact night-coverage [email or web address] Password [email or web address] 04/13/2014, 7:40 PM

## 2014-04-16 ENCOUNTER — Encounter: Payer: Self-pay | Admitting: Internal Medicine

## 2014-04-16 ENCOUNTER — Ambulatory Visit (INDEPENDENT_AMBULATORY_CARE_PROVIDER_SITE_OTHER): Payer: Medicare Other | Admitting: Internal Medicine

## 2014-04-16 VITALS — BP 120/82 | HR 70 | Temp 98.2°F | Wt 215.5 lb

## 2014-04-16 DIAGNOSIS — R7302 Impaired glucose tolerance (oral): Secondary | ICD-10-CM

## 2014-04-16 DIAGNOSIS — T671XXS Heat syncope, sequela: Secondary | ICD-10-CM

## 2014-04-16 DIAGNOSIS — E785 Hyperlipidemia, unspecified: Secondary | ICD-10-CM | POA: Diagnosis not present

## 2014-04-16 DIAGNOSIS — R7309 Other abnormal glucose: Secondary | ICD-10-CM

## 2014-04-16 DIAGNOSIS — T7589XS Other specified effects of external causes, sequela: Secondary | ICD-10-CM | POA: Diagnosis not present

## 2014-04-16 NOTE — Progress Notes (Signed)
Pre visit review using our clinic review tool, if applicable. No additional management support is needed unless otherwise documented below in the visit note. 

## 2014-04-16 NOTE — Patient Instructions (Signed)
Please continue all other medications as before, and refills have been done if requested.  Please have the pharmacy call with any other refills you may need.  Please continue your efforts at being more active, low cholesterol diet, and weight control.  Please keep your appointments with your specialists as you may have planned  Please call if you change your mind about the stress test, echocardiogram, and carotid artery testing

## 2014-04-16 NOTE — Progress Notes (Signed)
Subjective:    Patient ID: Matthew Tucker, male    DOB: [birth year], 63 y.o.   MRN: [id number]  HPI   Here with wife after recent spell severe weakness, had been outside in heat, then inside in non [facility] workplace, sweating quite a bit, not drinking water, because marked dizzy, fatigued, forced to lie down in an office, EMS called, pt seen in ER, better with rest and IVF's. No actual syncope.  Had what was felt to be possible signficant ecg changes but nonspecific, and pt refused further hospital stay. Since ER visit Pt denies chest pain, increased sob or doe, wheezing, orthopnea, PND, increased LE swelling, palpitations, dizziness or syncope. Pt denies new neurological symptoms such as new headache, or facial or extremity weakness or numbness  Pt denies polydipsia, polyuria,  Has cpx in 3 wks Past Medical History  Diagnosis Date  . Arthritis   . Impaired glucose tolerance 08/29/2011  . Anxiety state, unspecified 09/02/2007  . BACK PAIN, CHRONIC 10/31/2010  . HIP PAIN, RIGHT 12/02/2007  . HYPERLIPIDEMIA 09/02/2007  . Pain in joint, lower leg 12/02/2007  . Pain in Soft Tissues of Limb 12/02/2007  . PARESTHESIA 09/02/2007  . Toxic effect of chlorine gas(987.6) 10/31/2010  . WEAKNESS, RIGHT SIDE OF BODY 02/28/2010  . Family history of colon cancer Jan 26, 2013    Father died at 61yo   Past Surgical History  Procedure Laterality Date  . Mass removal      back, forehead; benign  . Mass removal  2011    head; benign  . S/p lipoma right scalp posteriorly  2011    reports that he has quit smoking. He has never used smokeless tobacco. He reports that he does not drink alcohol or use illicit drugs. family history includes Cancer in his mother; Colon cancer (age of onset: 86) in his father; Stroke (age of onset: 32) in his brother. There is no history of Esophageal cancer or Stomach cancer. Allergies  Allergen Reactions  . Levofloxacin Other (See Comments)    Irritates gums   Current Outpatient  Prescriptions on File Prior to Visit  Medication Sig Dispense Refill  . aspirin EC 81 MG tablet Take 81 mg by mouth daily.      Marland Kitchen ibuprofen (ADVIL,MOTRIN) 200 MG tablet Take 400-600 mg by mouth every 6 (six) hours as needed for moderate pain.       No current facility-administered medications on file prior to visit.   Review of Systems  Constitutional: Negative for unusual diaphoresis or other sweats  HENT: Negative for ringing in ear Eyes: Negative for double vision or worsening visual disturbance.  Respiratory: Negative for choking and stridor.   Gastrointestinal: Negative for vomiting or other signifcant bowel change Genitourinary: Negative for hematuria or decreased urine volume.  Musculoskeletal: Negative for other MSK pain or swelling Skin: Negative for color change and worsening wound.  Neurological: Negative for tremors and numbness other than noted  Psychiatric/Behavioral: Negative for decreased concentration or agitation other than above       Objective:   Physical Exam BP 120/82  Pulse 70  Temp(Src) 98.2 F (36.8 C) (Oral)  Wt 215 lb 8 oz (97.75 kg)  SpO2 99% VS noted,  Constitutional: Pt appears well-developed, well-nourished.  HENT: Head: NCAT.  Right Ear: External ear normal.  Left Ear: External ear normal.  Eyes: . Pupils are equal, round, and reactive to light. Conjunctivae and EOM are normal Neck: Normal range of motion. Neck supple.  Cardiovascular: Normal rate and  regular rhythm.   Pulmonary/Chest: Effort normal and breath sounds normal.  Abd:  Soft, NT, ND, + BS Neurological: Pt is alert. Not confused , motor grossly intact Skin: Skin is warm. No rash Psychiatric: Pt behavior is normal. No agitation.     Assessment & Plan:

## 2014-04-18 NOTE — Assessment & Plan Note (Signed)
stable overall by history and exam, recent data reviewed with pt, and pt to continue medical treatment as before,  to f/u any worsening symptoms or concerns Lab Results  Component Value Date   HGBA1C 5.4 01/23/2014

## 2014-04-18 NOTE — Assessment & Plan Note (Signed)
Likely low volume related episode as above, resolved, asympt,  to f/u any worsening symptoms or concerns, ECG reviewed as per emr, pt declines further eval such as MRI head, carotid study, echo for now

## 2014-04-18 NOTE — Assessment & Plan Note (Signed)
stable overall by history and exam, recent data reviewed with pt, and pt to continue medical treatment as before,  to f/u any worsening symptoms or concerns Lab Results  Component Value Date   LDLCALC 91 01/23/2014

## 2015-01-29 ENCOUNTER — Other Ambulatory Visit (INDEPENDENT_AMBULATORY_CARE_PROVIDER_SITE_OTHER): Payer: Medicare Other

## 2015-01-29 ENCOUNTER — Encounter: Payer: Self-pay | Admitting: Internal Medicine

## 2015-01-29 ENCOUNTER — Ambulatory Visit (INDEPENDENT_AMBULATORY_CARE_PROVIDER_SITE_OTHER): Payer: Medicare Other | Admitting: Internal Medicine

## 2015-01-29 VITALS — BP 128/82 | HR 74 | Temp 98.6°F | Wt 217.0 lb

## 2015-01-29 DIAGNOSIS — N320 Bladder-neck obstruction: Secondary | ICD-10-CM

## 2015-01-29 DIAGNOSIS — F411 Generalized anxiety disorder: Secondary | ICD-10-CM

## 2015-01-29 DIAGNOSIS — R7302 Impaired glucose tolerance (oral): Secondary | ICD-10-CM

## 2015-01-29 DIAGNOSIS — R202 Paresthesia of skin: Secondary | ICD-10-CM | POA: Insufficient documentation

## 2015-01-29 DIAGNOSIS — E785 Hyperlipidemia, unspecified: Secondary | ICD-10-CM

## 2015-01-29 DIAGNOSIS — Z23 Encounter for immunization: Secondary | ICD-10-CM | POA: Diagnosis not present

## 2015-01-29 DIAGNOSIS — M545 Low back pain, unspecified: Secondary | ICD-10-CM

## 2015-01-29 LAB — BASIC METABOLIC PANEL
BUN: 11 mg/dL (ref 6–23)
CALCIUM: 9.1 mg/dL (ref 8.4–10.5)
CO2: 31 mEq/L (ref 19–32)
Chloride: 99 mEq/L (ref 96–112)
Creatinine, Ser: 1.35 mg/dL (ref 0.40–1.50)
GFR: 68.03 mL/min (ref >60.00)
Glucose, Bld: 103 mg/dL — ABNORMAL HIGH (ref 70–99)
Potassium: 3.4 mEq/L — ABNORMAL LOW (ref 3.5–5.1)
Sodium: 138 mEq/L (ref 135–145)

## 2015-01-29 LAB — LIPID PANEL
CHOLESTEROL: 248 mg/dL — AB (ref 0–200)
HDL: 65.4 mg/dL (ref >39.00)
LDL CALC: 164 mg/dL — AB (ref 0–99)
NONHDL: 182.6
Total CHOL/HDL Ratio: 4
Triglycerides: 92 mg/dL (ref 0.0–149.0)
VLDL: 18.4 mg/dL (ref 0.0–40.0)

## 2015-01-29 LAB — URINALYSIS, ROUTINE W REFLEX MICROSCOPIC
Bilirubin Urine: NEGATIVE
LEUKOCYTES UA: NEGATIVE
NITRITE: NEGATIVE
SPECIFIC GRAVITY, URINE: 1.025 (ref 1.000–1.030)
Urine Glucose: NEGATIVE
Urobilinogen, UA: 1 (ref 0.0–1.0)
pH: 6 (ref 5.0–8.0)

## 2015-01-29 LAB — HEPATIC FUNCTION PANEL
ALK PHOS: 88 U/L (ref 39–117)
ALT: 22 U/L (ref 0–53)
AST: 26 U/L (ref 0–37)
Albumin: 4 g/dL (ref 3.5–5.2)
BILIRUBIN TOTAL: 1.1 mg/dL (ref 0.2–1.2)
Bilirubin, Direct: 0.2 mg/dL (ref 0.0–0.3)
Total Protein: 7 g/dL (ref 6.0–8.3)

## 2015-01-29 LAB — CBC WITH DIFFERENTIAL/PLATELET
BASOS ABS: 0.1 10*3/uL (ref 0.0–0.1)
Basophils Relative: 0.9 % (ref 0.0–3.0)
EOS ABS: 0.4 10*3/uL (ref 0.0–0.7)
Eosinophils Relative: 5.2 % — ABNORMAL HIGH (ref 0.0–5.0)
HEMATOCRIT: 45.5 % (ref 39.0–52.0)
Hemoglobin: 15.5 g/dL (ref 13.0–17.0)
LYMPHS ABS: 3.2 10*3/uL (ref 0.7–4.0)
Lymphocytes Relative: 45.1 % (ref 12.0–46.0)
MCHC: 34 g/dL (ref 30.0–36.0)
MCV: 90.1 fl (ref 78.0–100.0)
MONOS PCT: 8.9 % (ref 3.0–12.0)
Monocytes Absolute: 0.6 10*3/uL (ref 0.1–1.0)
Neutro Abs: 2.9 10*3/uL (ref 1.4–7.7)
Neutrophils Relative %: 39.9 % — ABNORMAL LOW (ref 43.0–77.0)
PLATELETS: 195 10*3/uL (ref 150.0–400.0)
RBC: 5.05 Mil/uL (ref 4.22–5.81)
RDW: 12.9 % (ref 11.5–15.5)
WBC: 7.2 10*3/uL (ref 4.0–10.5)

## 2015-01-29 LAB — PSA: PSA: 2.81 ng/mL (ref 0.10–4.00)

## 2015-01-29 LAB — HEMOGLOBIN A1C: Hgb A1c MFr Bld: 5.1 % (ref 4.6–6.5)

## 2015-01-29 LAB — TSH: TSH: 3.6 u[IU]/mL (ref 0.35–4.50)

## 2015-01-29 MED ORDER — IBUPROFEN 400 MG PO TABS: ORAL_TABLET | ORAL | Status: DC

## 2015-01-29 NOTE — Assessment & Plan Note (Signed)
stable overall by history and exam, and pt to continue medical treatment as before,  to f/u any worsening symptoms or concerns, for motrin prn as above

## 2015-01-29 NOTE — Assessment & Plan Note (Addendum)
stable overall by history and exam, recent data reviewed with pt, and pt to continue medical treatment as before,  to f/u any worsening symptoms or concerns Lab Results  Component Value Date   [place] 91 01/23/2014   For f/u labs

## 2015-01-29 NOTE — Progress Notes (Signed)
Pre visit review using our clinic review tool, if applicable. No additional management support is needed unless otherwise documented below in the visit note. 

## 2015-01-29 NOTE — Assessment & Plan Note (Signed)
stable overall by history and exam, recent data reviewed with pt, and pt to continue medical treatment as before,  to f/u any worsening symptoms or concerns Lab Results  Component Value Date   WBC 12.1* 04/13/2014   HGB 12.6* 04/13/2014   HCT 37.8* 04/13/2014   PLT 178 04/13/2014   GLUCOSE 163* 04/13/2014   CHOL 229* 01/23/2014   TRIG 363.0* 01/23/2014   HDL 65.90 01/23/2014   LDLDIRECT 147.4 01/10/2013   LDLCALC 91 01/23/2014   ALT 18 04/13/2014   AST 21 04/13/2014   NA 140 04/13/2014   K 3.7 04/13/2014   CL 104 04/13/2014   CREATININE 1.52* 04/13/2014   BUN 15 04/13/2014   CO2 23 04/13/2014   TSH 2.94 01/23/2014   PSA 1.14 01/23/2014   HGBA1C 5.4 01/23/2014

## 2015-01-29 NOTE — Progress Notes (Signed)
Subjective:    Patient ID: Matthew Tucker, male    DOB: Aug 07, 1951, 64 y.o.   MRN: [id number]  HPI  Here for yearly f/u;  Overall doing ok;  Pt denies Chest pain, worsening SOB, DOE, wheezing, orthopnea, PND, worsening LE edema, palpitations, dizziness or syncope.  Pt denies neurological change such as new headache, facial or extremity weakness.  Pt denies polydipsia, polyuria, or low sugar symptoms. Pt states overall good compliance with treatment and medications, good tolerability, and has been trying to follow appropriate diet.  Pt denies worsening depressive symptoms, suicidal ideation or panic. No fever, night sweats, wt loss, loss of appetite, or other constitutional symptoms.  Pt states good ability with ADL's, has low fall risk, home safety reviewed and adequate, no other significant changes in hearing or vision, and only occasionally active with exercise. Retired since July 2015.  Has some mild ED worsening in past few months, but hesitates to try viagra.  Does have ongoing RLE numbness, o/w ,Pt continues to have recurring LBP without change in severity, bowel or bladder change, fever, wt loss,  worsening LE pain/numbness/weakness, gait change or falls  Has no evidence for vascular insuff by recent prio eval. Asks for increased motrin to help with recurring hand djd pain Past Medical History  Diagnosis Date  . Arthritis   . Impaired glucose tolerance 08/29/2011  . Anxiety state, unspecified 09/02/2007  . BACK PAIN, CHRONIC 10/31/2010  . HIP PAIN, RIGHT 12/02/2007  . HYPERLIPIDEMIA 09/02/2007  . Pain in joint, lower leg 12/02/2007  . Pain in Soft Tissues of Limb 12/02/2007  . PARESTHESIA 09/02/2007  . Toxic effect of chlorine gas(987.6) 10/31/2010  . WEAKNESS, RIGHT SIDE OF BODY 02/28/2010  . Family history of colon cancer Jan 29, 2013    Father died at 57yo   Past Surgical History  Procedure Laterality Date  . Mass removal      back, forehead; benign  . Mass removal  2011    head; benign    . S/p lipoma right scalp posteriorly  2011    reports that he has quit smoking. He has never used smokeless tobacco. He reports that he does not drink alcohol or use illicit drugs. family history includes Cancer in his mother; Colon cancer (age of onset: 36) in his father; Stroke (age of onset: 35) in his brother. There is no history of Esophageal cancer or Stomach cancer. Allergies  Allergen Reactions  . Levofloxacin Other (See Comments)    Irritates gums   Current Outpatient Prescriptions on File Prior to Visit  Medication Sig Dispense Refill  . aspirin EC 81 MG tablet Take 81 mg by mouth daily.    Marland Kitchen ibuprofen (ADVIL,MOTRIN) 200 MG tablet Take 400-600 mg by mouth every 6 (six) hours as needed for moderate pain.     No current facility-administered medications on file prior to visit.    Review of Systems Constitutional: Negative for increased diaphoresis, other activity, appetite or siginficant weight change other than noted HENT: Negative for worsening hearing loss, ear pain, facial swelling, mouth sores and neck stiffness.   Eyes: Negative for other worsening pain, redness or visual disturbance.  Respiratory: Negative for shortness of breath and wheezing  Cardiovascular: Negative for chest pain and palpitations.  Gastrointestinal: Negative for diarrhea, blood in stool, abdominal distention or other pain Genitourinary: Negative for hematuria, flank pain or change in urine volume.  Musculoskeletal: Negative for myalgias or other joint complaints.  Skin: Negative for color change and wound or drainage.  Neurological: Negative for syncope and numbness. other than noted Hematological: Negative for adenopathy. or other swelling Psychiatric/Behavioral: Negative for hallucinations, SI, self-injury, decreased concentration or other worsening agitation.      Objective:   Physical Exam BP 128/82 mmHg  Pulse 74  Temp(Src) 98.6 F (37 C) (Oral)  Wt 217 lb (98.431 kg)  SpO2 96% VS  noted,  Constitutional: Pt is oriented to person, place, and time. Appears well-developed and well-nourished, in no significant distress Head: Normocephalic and atraumatic.  Right Ear: External ear normal.  Left Ear: External ear normal.  Nose: Nose normal.  Mouth/Throat: Oropharynx is clear and moist.  Eyes: Conjunctivae and EOM are normal. Pupils are equal, round, and reactive to light.  Neck: Normal range of motion. Neck supple. No JVD present. No tracheal deviation present or significant neck LA or mass Cardiovascular: Normal rate, regular rhythm, normal heart sounds and intact distal pulses.   Pulmonary/Chest: Effort normal and breath sounds without rales or wheezing  Abdominal: Soft. Bowel sounds are normal. NT. No HSM  Musculoskeletal: Normal range of motion. Exhibits no edema.  Lymphadenopathy:  Has no cervical adenopathy.  Neurological: Pt is alert and oriented to person, place, and time. Pt has normal reflexes. No cranial nerve deficit. Motor grossly intact Skin: Skin is warm and dry. No rash noted.  Psychiatric:  Has mild depressed mood and affect, with some slowed psychomotor retardation it seems. Behavior is normal.     Assessment & Plan:

## 2015-01-29 NOTE — Addendum Note (Signed)
Addended by: Lyman Bishop on: 01/29/2015 09:48 AM   Modules accepted: Orders

## 2015-01-29 NOTE — Patient Instructions (Addendum)
You had the new Prevnar pneumonia shot today  Please take all new medication as prescribed  - the motrin 400's  Please continue all other medications as before, and refills have been done if requested.  Please have the pharmacy call with any other refills you may need.  Please continue your efforts at being more active, low cholesterol diet, and weight control.  You are otherwise up to date with prevention measures today.  Please keep your appointments with your specialists as you may have planned  You will be contacted regarding the referral for: neurology for the lower extremity nerve testing  Please go to the LAB in the Basement (turn left off the elevator) for the tests to be done today  You will be contacted by phone if any changes need to be made immediately.  Otherwise, you will receive a letter about your results with an explanation, but please check with MyChart first.  Please remember to sign up for MyChart if you have not done so, as this will be important to you in the future with finding out test results, communicating by private email, and scheduling acute appointments online when needed.  Please return in 6 months, or sooner if needed

## 2015-01-29 NOTE — Assessment & Plan Note (Addendum)
stable overall by history and exam, recent data reviewed with pt, and pt to continue medical treatment as before,  to f/u any worsening symptoms or concerns Lab Results  Component Value Date   HGBA1C 5.4 01/23/2014   For f/u a1c, cont to work on diet, wt control  Note:  Total time for pt hx, exam, review of record with pt in the room, determination of diagnoses and plan for further eval and tx is > 40 min, with over 50% spent in coordination and counseling of patient

## 2015-02-01 ENCOUNTER — Other Ambulatory Visit: Payer: Self-pay | Admitting: *Deleted

## 2015-02-01 DIAGNOSIS — R202 Paresthesia of skin: Secondary | ICD-10-CM

## 2015-02-17 ENCOUNTER — Encounter: Payer: Self-pay | Admitting: Internal Medicine

## 2015-03-02 ENCOUNTER — Ambulatory Visit (INDEPENDENT_AMBULATORY_CARE_PROVIDER_SITE_OTHER): Payer: Medicare Other | Admitting: Neurology

## 2015-03-02 DIAGNOSIS — R202 Paresthesia of skin: Secondary | ICD-10-CM | POA: Diagnosis not present

## 2015-03-02 DIAGNOSIS — M5417 Radiculopathy, lumbosacral region: Secondary | ICD-10-CM

## 2015-03-02 NOTE — Procedures (Signed)
[facility]  [address]  [place], [place] [address] Tel: [phone] Fax:  [phone] Test Date:  03/02/2015  Patient: Matthew Tucker DOB: 1950/12/22 Physician: Narda Amber, DO  Sex: Male Height: 5\' 10"  Ref Phys: Jeneen Rinks John,M.D.  ID#: [id number] Temp: 34.2C Technician: M. Dean   Patient Complaints: This is a 64 year old gentleman presenting for evaluation of right leg paresthesias.   NCV & EMG Findings: Extensive electrodiagnostic testing of the right lower extremity and additional studies of the left shows:  1. Bilateral sural and superficial peroneal sensory responses are within normal limits.  2. Bilateral peroneal and tibial motor responses are within normal limits. 3. Chronic motor axon loss changes are seen affecting the rectus femoris and abductor longus muscles, without accompanied active denervation. These findings are not present on the left side.  Impression: 1. Chronic L3-L4 radiculopathy affecting the right lower extremity, mild in degree electrically. 2. There is no evidence of a generalized sensorimotor polyneuropathy affecting the right lower extremity.   ___________________________ Narda Amber, DO    Nerve Conduction Studies Anti Sensory Summary Table   Site NR Peak (ms) Norm Peak (ms) P-T Amp (V) Norm P-T Amp  Left Sup Peroneal Anti Sensory (Ant Lat Mall)  12 cm    2.2 <4.6 5.2 >3  Right Sup Peroneal Anti Sensory (Ant Lat Mall)  12 cm    3.6 <4.6 4.3 >3  Left Sural Anti Sensory (Lat Mall)  Calf    4.2 <4.6 5.5 >3  Right Sural Anti Sensory (Lat Mall)  Calf    3.3 <4.6 4.5 >3   Motor Summary Table   Site NR Onset (ms) Norm Onset (ms) O-P Amp (mV) Norm O-P Amp Site1 Site2 Delta-0 (ms) Dist (cm) Vel (m/s) Norm Vel (m/s)  Left Peroneal Motor (Ext Dig Brev)  Ankle    4.4 <6.0 4.4 >2.5 B Fib Ankle 8.4 36.0 43 >40  B Fib    12.8  4.1  Poplt B Fib 2.0 10.0 50 >40  Poplt    14.8  4.1         Right Peroneal Motor (Ext Dig Brev)    Ankle    4.0 <6.0 6.9 >2.5 B Fib Ankle 7.7 33.0 43 >40  B Fib    11.7  6.6  Poplt B Fib 2.2 10.0 45 >40  Poplt    13.9  6.7         Left Tibial Motor (Abd Hall Brev)  Ankle    3.9 <6.0 6.5 >4 Knee Ankle 9.6 41.0 43 >40  Knee    13.5  6.2         Right Tibial Motor (Abd Hall Brev)  Ankle    4.8 <6.0 5.7 >4 Knee Ankle 9.3 43.0 46 >40  Knee    14.1  4.1          H Reflex Studies   NR H-Lat (ms) Lat Norm (ms) L-R H-Lat (ms)  Right Tibial (Gastroc)     36.19 <35 1.22   EMG   Side Muscle Ins Act Fibs Psw Fasc Number Recrt Dur Dur. Amp Amp. Poly Poly. Comment  Left AntTibialis Nml Nml Nml Nml Nml Nml Nml Nml Nml Nml Nml Nml N/A  Right AntTibialis Nml Nml Nml Nml Nml Nml Nml Nml Nml Nml Nml Nml N/A  Right Gastroc Nml Nml Nml Nml Nml Nml Nml Nml Nml Nml Nml Nml N/A  Right Flex Dig Long Nml Nml Nml Nml Nml Nml Nml Nml  Nml Nml Nml Nml N/A  Right RectFemoris Nml Nml Nml Nml 1- Rapid Some 1+ Nml Nml Nml Nml N/A  Right GluteusMed Nml Nml Nml Nml Nml Nml Nml Nml Nml Nml Nml Nml N/A  Right AdductorLong Nml Nml Nml Nml 1- Mod-R Some 1+ Nml Nml Nml Nml N/A  Left RectFemoris Nml Nml Nml Nml Nml Nml Nml Nml Nml Nml Nml Nml N/A      Waveforms:

## 2015-03-05 ENCOUNTER — Encounter: Payer: Self-pay | Admitting: Internal Medicine

## 2015-05-20 ENCOUNTER — Other Ambulatory Visit (INDEPENDENT_AMBULATORY_CARE_PROVIDER_SITE_OTHER): Payer: Medicare Other

## 2015-05-20 ENCOUNTER — Ambulatory Visit (INDEPENDENT_AMBULATORY_CARE_PROVIDER_SITE_OTHER): Payer: Medicare Other | Admitting: Internal Medicine

## 2015-05-20 ENCOUNTER — Encounter: Payer: Self-pay | Admitting: Internal Medicine

## 2015-05-20 VITALS — BP 124/88 | HR 80 | Temp 98.4°F | Ht 71.0 in | Wt 213.0 lb

## 2015-05-20 DIAGNOSIS — Z205 Contact with and (suspected) exposure to viral hepatitis: Secondary | ICD-10-CM

## 2015-05-20 DIAGNOSIS — Z23 Encounter for immunization: Secondary | ICD-10-CM | POA: Diagnosis not present

## 2015-05-20 DIAGNOSIS — M5416 Radiculopathy, lumbar region: Secondary | ICD-10-CM | POA: Diagnosis not present

## 2015-05-20 DIAGNOSIS — E785 Hyperlipidemia, unspecified: Secondary | ICD-10-CM

## 2015-05-20 DIAGNOSIS — R972 Elevated prostate specific antigen [PSA]: Secondary | ICD-10-CM | POA: Insufficient documentation

## 2015-05-20 DIAGNOSIS — R7302 Impaired glucose tolerance (oral): Secondary | ICD-10-CM

## 2015-05-20 DIAGNOSIS — Z20828 Contact with and (suspected) exposure to other viral communicable diseases: Secondary | ICD-10-CM | POA: Diagnosis not present

## 2015-05-20 HISTORY — DX: Radiculopathy, lumbar region: M54.16

## 2015-05-20 LAB — HEPATITIS C ANTIBODY: HCV AB: NEGATIVE

## 2015-05-20 LAB — PSA: PSA: 1.35 ng/mL (ref 0.10–4.00)

## 2015-05-20 MED ORDER — IBUPROFEN 400 MG PO TABS: ORAL_TABLET | ORAL | Status: DC

## 2015-05-20 NOTE — Progress Notes (Signed)
Pre visit review using our clinic review tool, if applicable. No additional management support is needed unless otherwise documented below in the visit note. 

## 2015-05-20 NOTE — Assessment & Plan Note (Signed)
stable overall by history and exam, recent data reviewed with pt, and pt to continue medical treatment as before,  to f/u any worsening symptoms or concerns Lab Results  Component Value Date   HGBA1C 5.1 01/29/2015

## 2015-05-20 NOTE — Patient Instructions (Addendum)
You had the flu shot today  Please continue all other medications as before, and refills have been done if requested - the ibuprofen  Please have the pharmacy call with any other refills you may need.  Please continue your efforts at being more active, low cholesterol diet, and weight control.  You are otherwise up to date with prevention measures today.  You will be contacted regarding the referral for: MRI lumbar spine  You will be contacted regarding the referral for: Neurology for the right leg nerve damage  Please keep your appointments with your specialists as you may have planned  Please go to the LAB in the Basement (turn left off the elevator) for the tests to be done today - the Hep C test, and repeat PSA only  If the PSA is elevated, you may need to also be referred to Urology  You will be contacted by phone if any changes need to be made immediately.  Otherwise, you will receive a letter about your results with an explanation, but please check with MyChart first.  Please remember to sign up for MyChart if you have not done so, as this will be important to you in the future with finding out test results, communicating by private email, and scheduling acute appointments online when needed.

## 2015-05-20 NOTE — Assessment & Plan Note (Signed)
Lab Results  Component Value Date   LDLCALC 164* 01/29/2015  d/w pt, was intolerant lipitor, declines other statin trial today, wants to work on diet, with fu at 6 mo

## 2015-05-20 NOTE — Progress Notes (Signed)
Subjective:    Patient ID: Matthew Tucker, male    DOB: Jul 16, 1951, 64 y.o.   MRN: [id number]  HPI  Here to f/u; overall doing ok,  Pt denies chest pain, increasing sob or doe, wheezing, orthopnea, PND, increased LE swelling, palpitations, dizziness or syncope.  Pt denies new neurological symptoms such as new headache, or facial or extremity weakness or numbness.  Pt denies polydipsia, polyuria, or low sugar episode.   Pt denies new neurological symptoms such as new headache, or facial or extremity weakness or numbness.   Pt states overall good compliance with meds, mostly trying to follow appropriate diet, with wt overall stable,  but little exercise however.  Plans to leave soon for [facility] for up to 6 months, but not sure when Wt Readings from Last 3 Encounters:  05/20/15 213 lb (96.616 kg)  01/29/15 217 lb (98.431 kg)  04/16/14 215 lb 8 oz (97.75 kg)   Past Medical History  Diagnosis Date  . Arthritis   . Impaired glucose tolerance 08/29/2011  . Anxiety state, unspecified 09/02/2007  . BACK PAIN, CHRONIC 10/31/2010  . HIP PAIN, RIGHT 12/02/2007  . HYPERLIPIDEMIA 09/02/2007  . Pain in joint, lower leg 12/02/2007  . Pain in Soft Tissues of Limb 12/02/2007  . PARESTHESIA 09/02/2007  . Toxic effect of chlorine gas(987.6) 10/31/2010  . WEAKNESS, RIGHT SIDE OF BODY 02/28/2010  . Family history of colon cancer 2013/02/10    Father died at 28yo   Past Surgical History  Procedure Laterality Date  . Mass removal      back, forehead; benign  . Mass removal  2011    head; benign  . S/p lipoma right scalp posteriorly  2011    reports that he has never smoked. He has never used smokeless tobacco. He reports that he does not drink alcohol or use illicit drugs. family history includes Cancer in his mother; Colon cancer (age of onset: 61) in his father; Stroke (age of onset: 44) in his brother. There is no history of Esophageal cancer or Stomach cancer. Allergies  Allergen Reactions  .  Levofloxacin Other (See Comments)    Irritates gums   Current Outpatient Prescriptions on File Prior to Visit  Medication Sig Dispense Refill  . aspirin EC 81 MG tablet Take 81 mg by mouth daily.    Marland Kitchen ibuprofen (ADVIL,MOTRIN) 400 MG tablet 1-2 tab by mouth twice per day as needed 120 tablet 2   No current facility-administered medications on file prior to visit.     Review of Systems  Constitutional: Negative for unusual diaphoresis or night sweats HENT: Negative for ringing in ear or discharge Eyes: Negative for double vision or worsening visual disturbance.  Respiratory: Negative for choking and stridor.   Gastrointestinal: Negative for vomiting or other signifcant bowel change Genitourinary: Negative for hematuria or change in urine volume.  Musculoskeletal: Negative for other MSK pain or swelling Skin: Negative for color change and worsening wound.  Neurological: Negative for tremors and numbness other than noted  Psychiatric/Behavioral: Negative for decreased concentration or agitation other than above       Objective:   Physical Exam BP 124/88 mmHg  Pulse 80  Temp(Src) 98.4 F (36.9 C) (Oral)  Ht 5\' 11"  (1.803 m)  Wt 213 lb (96.616 kg)  BMI 29.72 kg/m2  SpO2 96% VS noted,  Constitutional: Pt appears in no significant distress HENT: Head: NCAT.  Right Ear: External ear normal.  Left Ear: External ear normal.  Eyes: .  Pupils are equal, round, and reactive to light. Conjunctivae and EOM are normal Neck: Normal range of motion. Neck supple.  Cardiovascular: Normal rate and regular rhythm.   Pulmonary/Chest: Effort normal and breath sounds without rales or wheezing.  Abd:  Soft, NT, ND, + BS Neurological: Pt is alert. Not confused , motor grossly intact Skin: Skin is warm. No rash, no LE edema Psychiatric: Pt behavior is normal. No agitation.     Assessment & Plan:

## 2015-05-20 NOTE — Assessment & Plan Note (Signed)
Right side b y NCS, for neurology referral per pt request, also for MRI LS spine though little back pain

## 2015-05-20 NOTE — Assessment & Plan Note (Addendum)
Also for f/u psa Lab Results  Component Value Date   PSA 2.81 01/29/2015   PSA 1.14 01/23/2014   PSA 0.75 01/10/2013   Also for Hep C routine screen as well, since he is going to lab for blood draw, and is due

## 2015-05-30 ENCOUNTER — Ambulatory Visit
Admission: RE | Admit: 2015-05-30 | Discharge: 2015-05-30 | Disposition: A | Discharge status: Home / Self Care | Payer: Medicare Other | Source: Ambulatory Visit | Attending: Internal Medicine | Admitting: Internal Medicine

## 2015-05-30 ENCOUNTER — Other Ambulatory Visit: Payer: Self-pay | Admitting: Internal Medicine

## 2015-05-30 DIAGNOSIS — M5416 Radiculopathy, lumbar region: Secondary | ICD-10-CM

## 2015-05-30 DIAGNOSIS — M5126 Other intervertebral disc displacement, lumbar region: Secondary | ICD-10-CM | POA: Diagnosis not present

## 2015-06-03 DIAGNOSIS — M4806 Spinal stenosis, lumbar region: Secondary | ICD-10-CM | POA: Diagnosis not present

## 2015-06-03 DIAGNOSIS — M549 Dorsalgia, unspecified: Secondary | ICD-10-CM | POA: Diagnosis not present

## 2016-03-03 ENCOUNTER — Telehealth: Payer: Self-pay | Admitting: Internal Medicine

## 2016-03-03 DIAGNOSIS — L989 Disorder of the skin and subcutaneous tissue, unspecified: Secondary | ICD-10-CM

## 2016-03-03 NOTE — Telephone Encounter (Signed)
Ok, this has been done, thanks

## 2016-03-03 NOTE — Telephone Encounter (Signed)
Please advise 

## 2016-03-03 NOTE — Telephone Encounter (Signed)
Patient aware.

## 2016-03-03 NOTE — Telephone Encounter (Signed)
Pt wife called in said that pt has a couple of place on his face that needs to be looked at. He would like a referral to dermatologist

## 2016-03-20 ENCOUNTER — Encounter: Payer: Self-pay | Admitting: Internal Medicine

## 2016-04-06 DIAGNOSIS — L720 Epidermal cyst: Secondary | ICD-10-CM | POA: Diagnosis not present

## 2016-04-28 ENCOUNTER — Other Ambulatory Visit: Payer: Self-pay | Admitting: Internal Medicine

## 2016-04-28 ENCOUNTER — Ambulatory Visit (INDEPENDENT_AMBULATORY_CARE_PROVIDER_SITE_OTHER): Payer: Medicare Other | Admitting: Internal Medicine

## 2016-04-28 ENCOUNTER — Encounter: Payer: Self-pay | Admitting: Internal Medicine

## 2016-04-28 ENCOUNTER — Other Ambulatory Visit (INDEPENDENT_AMBULATORY_CARE_PROVIDER_SITE_OTHER): Payer: Medicare Other

## 2016-04-28 VITALS — BP 130/70 | HR 83 | Temp 98.0°F | Resp 20 | Wt 214.0 lb

## 2016-04-28 DIAGNOSIS — E785 Hyperlipidemia, unspecified: Secondary | ICD-10-CM | POA: Diagnosis not present

## 2016-04-28 DIAGNOSIS — N320 Bladder-neck obstruction: Secondary | ICD-10-CM

## 2016-04-28 DIAGNOSIS — R7302 Impaired glucose tolerance (oral): Secondary | ICD-10-CM | POA: Diagnosis not present

## 2016-04-28 DIAGNOSIS — N529 Male erectile dysfunction, unspecified: Secondary | ICD-10-CM | POA: Diagnosis not present

## 2016-04-28 DIAGNOSIS — R351 Nocturia: Secondary | ICD-10-CM | POA: Diagnosis not present

## 2016-04-28 LAB — CBC WITH DIFFERENTIAL/PLATELET
BASOS ABS: 0 10*3/uL (ref 0.0–0.1)
Basophils Relative: 0.5 % (ref 0.0–3.0)
EOS ABS: 0.5 10*3/uL (ref 0.0–0.7)
Eosinophils Relative: 8.3 % — ABNORMAL HIGH (ref 0.0–5.0)
HCT: 45.7 % (ref 39.0–52.0)
Hemoglobin: 15.5 g/dL (ref 13.0–17.0)
LYMPHS ABS: 2.5 10*3/uL (ref 0.7–4.0)
Lymphocytes Relative: 40.9 % (ref 12.0–46.0)
MCHC: 34 g/dL (ref 30.0–36.0)
MCV: 90.9 fl (ref 78.0–100.0)
MONO ABS: 0.5 10*3/uL (ref 0.1–1.0)
Monocytes Relative: 8 % (ref 3.0–12.0)
NEUTROS ABS: 2.6 10*3/uL (ref 1.4–7.7)
NEUTROS PCT: 42.3 % — AB (ref 43.0–77.0)
PLATELETS: 226 10*3/uL (ref 150.0–400.0)
RBC: 5.03 Mil/uL (ref 4.22–5.81)
RDW: 13.1 % (ref 11.5–15.5)
WBC: 6.1 10*3/uL (ref 4.0–10.5)

## 2016-04-28 LAB — LIPID PANEL
CHOL/HDL RATIO: 4
CHOLESTEROL: 296 mg/dL — AB (ref 0–200)
HDL: 76.6 mg/dL (ref >39.00)
LDL CALC: 205 mg/dL — AB (ref 0–99)
NonHDL: 219.32
Triglycerides: 70 mg/dL (ref 0.0–149.0)
VLDL: 14 mg/dL (ref 0.0–40.0)

## 2016-04-28 LAB — BASIC METABOLIC PANEL
BUN: 14 mg/dL (ref 6–23)
CHLORIDE: 100 meq/L (ref 96–112)
CO2: 30 meq/L (ref 19–32)
Calcium: 9.8 mg/dL (ref 8.4–10.5)
Creatinine, Ser: 1.4 mg/dL (ref 0.40–1.50)
GFR: 64.99 mL/min (ref >60.00)
GLUCOSE: 100 mg/dL — AB (ref 70–99)
Potassium: 3.6 mEq/L (ref 3.5–5.1)
SODIUM: 139 meq/L (ref 135–145)

## 2016-04-28 LAB — URINALYSIS, ROUTINE W REFLEX MICROSCOPIC
BILIRUBIN URINE: NEGATIVE
KETONES UR: NEGATIVE
Leukocytes, UA: NEGATIVE
Nitrite: NEGATIVE
PH: 6 (ref 5.0–8.0)
SPECIFIC GRAVITY, URINE: 1.01 (ref 1.000–1.030)
TOTAL PROTEIN, URINE-UPE24: NEGATIVE
UROBILINOGEN UA: 0.2 (ref 0.0–1.0)
Urine Glucose: NEGATIVE

## 2016-04-28 LAB — HEPATIC FUNCTION PANEL
ALBUMIN: 4.3 g/dL (ref 3.5–5.2)
ALK PHOS: 73 U/L (ref 39–117)
ALT: 22 U/L (ref 0–53)
AST: 26 U/L (ref 0–37)
BILIRUBIN DIRECT: 0.2 mg/dL (ref 0.0–0.3)
TOTAL PROTEIN: 7.5 g/dL (ref 6.0–8.3)
Total Bilirubin: 1.2 mg/dL (ref 0.2–1.2)

## 2016-04-28 LAB — PSA: PSA: 1.65 ng/mL (ref 0.10–4.00)

## 2016-04-28 LAB — TSH: TSH: 3.86 u[IU]/mL (ref 0.35–4.50)

## 2016-04-28 MED ORDER — ROSUVASTATIN CALCIUM 20 MG PO TABS: 20.0000 mg | ORAL_TABLET | Freq: Every day | ORAL | 3 refills | Status: DC

## 2016-04-28 MED ORDER — SILDENAFIL CITRATE 100 MG PO TABS: 50.0000 mg | ORAL_TABLET | Freq: Every day | ORAL | 11 refills | Status: DC | PRN

## 2016-04-28 MED ORDER — IBUPROFEN 400 MG PO TABS: ORAL_TABLET | ORAL | 2 refills | Status: DC

## 2016-04-28 NOTE — Patient Instructions (Signed)
Please take all new medication as prescribed - the viagra as needed  Please continue all other medications as before, and refills have been done if requested.  Please have the pharmacy call with any other refills you may need.  Please continue your efforts at being more active, low cholesterol diet, and weight control.  You are otherwise up to date with prevention measures today.  Please keep your appointments with your specialists as you may have planned  Please go to the LAB in the Basement (turn left off the elevator) for the tests to be done today  You will be contacted by phone if any changes need to be made immediately.  Otherwise, you will receive a letter about your results with an explanation, but please check with MyChart first.  Please remember to sign up for MyChart if you have not done so, as this will be important to you in the future with finding out test results, communicating by private email, and scheduling acute appointments online when needed.  Please return in 1 year for your yearly visit, or sooner if needed

## 2016-04-28 NOTE — Progress Notes (Signed)
Subjective:    Patient ID: Matthew Tucker, male    DOB: 07/02/1951, 65 y.o.   MRN: [id number]  HPI  Here for yearly f/u;  Overall doing ok;  Pt denies Chest pain, worsening SOB, DOE, wheezing, orthopnea, PND, worsening LE edema, palpitations, dizziness or syncope.  Pt denies neurological change such as new headache, facial or extremity weakness.  Pt denies polydipsia, polyuria, or low sugar symptoms. Pt states overall good compliance with treatment and medications, good tolerability, and has been trying to follow appropriate diet.  Pt denies worsening depressive symptoms, suicidal ideation or panic. No fever, night sweats, wt loss, loss of appetite, or other constitutional symptoms.  Pt states good ability with ADL's, has low fall risk, home safety reviewed and adequate, no other significant changes in hearing or vision, and only occasionally active with exercise.  Has chronic numbness and intermittent weakness and balance issue assoc with known chronic l3-4 radiculopathy, non surgical per NS. Wt Readings from Last 3 Encounters:  04/28/16 214 lb (97.1 kg)  05/20/15 213 lb (96.6 kg)  01/29/15 217 lb (98.4 kg)  Also has had some worsening ED symptoms over the past 6 mo. Past Medical History:  Diagnosis Date  . Anxiety state, unspecified 09/02/2007  . Arthritis   . BACK PAIN, CHRONIC 10/31/2010  . Chronic lumbar radiculopathy 05/20/2015  . Family history of colon cancer 11-Feb-2013   Father died at 33yo  . HIP PAIN, RIGHT 12/02/2007  . HYPERLIPIDEMIA 09/02/2007  . Impaired glucose tolerance 08/29/2011  . Pain in joint, lower leg 12/02/2007  . Pain in Soft Tissues of Limb 12/02/2007  . PARESTHESIA 09/02/2007  . Toxic effect of chlorine gas(987.6) 10/31/2010  . WEAKNESS, RIGHT SIDE OF BODY 02/28/2010   Past Surgical History:  Procedure Laterality Date  . mass removal     back, forehead; benign  . mass removal  2011   head; benign  . s/p lipoma right scalp posteriorly  2011    reports that he  has never smoked. He has never used smokeless tobacco. He reports that he does not drink alcohol or use drugs. family history includes Cancer in his mother; Colon cancer (age of onset: 73) in his father; Stroke (age of onset: 24) in his brother. Allergies  Allergen Reactions  . Levofloxacin Other (See Comments)    Irritates gums   Current Outpatient Prescriptions on File Prior to Visit  Medication Sig Dispense Refill  . aspirin EC 81 MG tablet Take 81 mg by mouth daily.     No current facility-administered medications on file prior to visit.   Review of Systems  Constitutional: Negative for unusual diaphoresis or night sweats HENT: Negative for ear swelling or discharge Eyes: Negative for worsening visual haziness  Respiratory: Negative for choking and stridor.   Gastrointestinal: Negative for distension or worsening eructation Genitourinary: Negative for retention or change in urine volume.  Musculoskeletal: Negative for other MSK pain or swelling Skin: Negative for color change and worsening wound Neurological: Negative for tremors and numbness other than noted  Psychiatric/Behavioral: Negative for decreased concentration or agitation other than above       Objective:   Physical Exam BP 130/70   Pulse 83   Temp 98 F (36.7 C) (Oral)   Resp 20   Wt 214 lb (97.1 kg)   SpO2 98%   BMI 29.85 kg/m  VS noted,  Constitutional: Pt appears in no apparent distress HENT: Head: NCAT.  Right Ear: External ear normal.  Left Ear:  External ear normal.  Eyes: . Pupils are equal, round, and reactive to light. Conjunctivae and EOM are normal Neck: Normal range of motion. Neck supple.  Cardiovascular: Normal rate and regular rhythm.   Pulmonary/Chest: Effort normal and breath sounds without rales or wheezing.  Abd:  Soft, NT, ND, + BS Neurological: Pt is alert. Not confused , motor grossly intact Skin: Skin is warm. No rash, no LE edema Psychiatric: Pt behavior is normal. No agitation.     Most recent NS - Impression: June 2016 1. Chronic L3-L4 radiculopathy affecting the right lower  extremity, mild in degree electrically. 2. There is no evidence of a generalized sensorimotor  polyneuropathy affecting the right lower extremity.  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 This is exacerbated by prominent dorsal epidural fat. Disc degeneration with disc bulges at each of the lumbar regions further encroaches upon the spinal canal the disc levels. The subarachnoid space is effaced in the nerve roots are crowded. This could be symptomatic. Focally prominent bulging/ protrusion of the discs are noted on the right at L2-3 and on the right at L4-5, leading to additional potential for right-sided neural compression at those levels.    Assessment & Plan:

## 2016-04-28 NOTE — Progress Notes (Signed)
Pre visit review using our clinic review tool, if applicable. No additional management support is needed unless otherwise documented below in the visit note. 

## 2016-04-29 NOTE — Assessment & Plan Note (Signed)
Has been increasing LDL last few yrs, for lower chol diet, o/w stable overall by history and exam, recent data reviewed with pt, and pt to continue medical treatment as before,  to f/u any worsening symptoms or concerns

## 2016-04-29 NOTE — Assessment & Plan Note (Signed)
stable overall by history and exam, recent data reviewed with pt, and pt to continue medical treatment as before,  to f/u any worsening symptoms or concerns Lab Results  Component Value Date   HGBA1C 5.1 01/29/2015

## 2016-04-29 NOTE — Assessment & Plan Note (Signed)
D/w pt, for viagra prn,  to f/u any worsening symptoms or concerns

## 2016-04-29 NOTE — Assessment & Plan Note (Signed)
Possible prostate issue, decline any med trial or urology referral

## 2016-06-30 DIAGNOSIS — L720 Epidermal cyst: Secondary | ICD-10-CM | POA: Diagnosis not present

## 2016-06-30 DIAGNOSIS — R208 Other disturbances of skin sensation: Secondary | ICD-10-CM | POA: Diagnosis not present

## 2016-07-06 ENCOUNTER — Telehealth: Payer: Self-pay | Admitting: Internal Medicine

## 2016-07-06 NOTE — Telephone Encounter (Signed)
Rec'd from [facility] forward 2 pages to [place]

## 2016-09-07 ENCOUNTER — Telehealth: Payer: Self-pay | Admitting: Internal Medicine

## 2016-09-07 NOTE — Telephone Encounter (Signed)
Called pt to schedule awv appt. Left msg for pt to call office to schedule appt.

## 2016-09-15 NOTE — Telephone Encounter (Signed)
Pt called back. Please give him a call [phone].

## 2016-09-18 NOTE — Telephone Encounter (Signed)
Called patient back. Explained what an annual wellness visit is. Patient stated that he is not interested in scheduling appt at this time.

## 2017-01-02 ENCOUNTER — Other Ambulatory Visit (INDEPENDENT_AMBULATORY_CARE_PROVIDER_SITE_OTHER): Payer: Medicare Other

## 2017-01-02 ENCOUNTER — Ambulatory Visit (INDEPENDENT_AMBULATORY_CARE_PROVIDER_SITE_OTHER): Payer: Medicare Other | Admitting: Internal Medicine

## 2017-01-02 ENCOUNTER — Encounter: Payer: Self-pay | Admitting: Internal Medicine

## 2017-01-02 VITALS — BP 130/84 | HR 85 | Ht 70.0 in | Wt 227.0 lb

## 2017-01-02 DIAGNOSIS — F4321 Adjustment disorder with depressed mood: Secondary | ICD-10-CM | POA: Insufficient documentation

## 2017-01-02 DIAGNOSIS — E785 Hyperlipidemia, unspecified: Secondary | ICD-10-CM | POA: Diagnosis not present

## 2017-01-02 DIAGNOSIS — F4320 Adjustment disorder, unspecified: Secondary | ICD-10-CM

## 2017-01-02 DIAGNOSIS — R7302 Impaired glucose tolerance (oral): Secondary | ICD-10-CM | POA: Diagnosis not present

## 2017-01-02 DIAGNOSIS — R3129 Other microscopic hematuria: Secondary | ICD-10-CM

## 2017-01-02 LAB — URINALYSIS, ROUTINE W REFLEX MICROSCOPIC
Bilirubin Urine: NEGATIVE
Ketones, ur: NEGATIVE
Leukocytes, UA: NEGATIVE
NITRITE: NEGATIVE
SPECIFIC GRAVITY, URINE: 1.025 (ref 1.000–1.030)
Total Protein, Urine: NEGATIVE
Urine Glucose: NEGATIVE
Urobilinogen, UA: 0.2 (ref 0.0–1.0)
WBC UA: NONE SEEN (ref 0–?)
pH: 6 (ref 5.0–8.0)

## 2017-01-02 LAB — BASIC METABOLIC PANEL
BUN: 20 mg/dL (ref 6–23)
CHLORIDE: 105 meq/L (ref 96–112)
CO2: 28 mEq/L (ref 19–32)
Calcium: 9.1 mg/dL (ref 8.4–10.5)
Creatinine, Ser: 1.43 mg/dL (ref 0.40–1.50)
GFR: 63.28 mL/min (ref >60.00)
Glucose, Bld: 107 mg/dL — ABNORMAL HIGH (ref 70–99)
POTASSIUM: 3.5 meq/L (ref 3.5–5.1)
Sodium: 139 mEq/L (ref 135–145)

## 2017-01-02 LAB — HEPATIC FUNCTION PANEL
ALT: 18 U/L (ref 0–53)
AST: 24 U/L (ref 0–37)
Albumin: 4.1 g/dL (ref 3.5–5.2)
Alkaline Phosphatase: 77 U/L (ref 39–117)
BILIRUBIN TOTAL: 0.7 mg/dL (ref 0.2–1.2)
Bilirubin, Direct: 0.1 mg/dL (ref 0.0–0.3)
Total Protein: 7.3 g/dL (ref 6.0–8.3)

## 2017-01-02 LAB — LIPID PANEL
CHOL/HDL RATIO: 3
Cholesterol: 179 mg/dL (ref 0–200)
HDL: 65.4 mg/dL (ref >39.00)
LDL CALC: 99 mg/dL (ref 0–99)
NONHDL: 113.61
Triglycerides: 74 mg/dL (ref 0.0–149.0)
VLDL: 14.8 mg/dL (ref 0.0–40.0)

## 2017-01-02 LAB — HEMOGLOBIN A1C: Hgb A1c MFr Bld: 5.5 % (ref 4.6–6.5)

## 2017-01-02 MED ORDER — MELOXICAM 15 MG PO TABS: 15.0000 mg | ORAL_TABLET | Freq: Every day | ORAL | 3 refills | Status: DC | PRN

## 2017-01-02 MED ORDER — ROSUVASTATIN CALCIUM 20 MG PO TABS: 20.0000 mg | ORAL_TABLET | Freq: Every day | ORAL | 3 refills | Status: DC

## 2017-01-02 NOTE — Progress Notes (Signed)
Pre visit review using our clinic review tool, if applicable. No additional management support is needed unless otherwise documented below in the visit note. 

## 2017-01-02 NOTE — Assessment & Plan Note (Signed)
D/w pt, declines depressoin or SI or HI, declines counseling ore psychiatry referral

## 2017-01-02 NOTE — Assessment & Plan Note (Signed)
Minor but recently persistnet, for repeat UA today, also refer urology r/o malignancy

## 2017-01-02 NOTE — Patient Instructions (Addendum)
Please continue all other medications as before, and refills have been done if requested.  Please have the pharmacy call with any other refills you may need.  Please continue your efforts at being more active, low cholesterol diet, and weight control.  You are otherwise up to date with prevention measures today.  Please keep your appointments with your specialists as you may have planned  You will be contacted regarding the referral for: urology  Please go to the LAB in the Basement (turn left off the elevator) for the tests to be done today  You will be contacted by phone if any changes need to be made immediately.  Otherwise, you will receive a letter about your results with an explanation, but please check with MyChart first.  Please remember to sign up for MyChart if you have not done so, as this will be important to you in the future with finding out test results, communicating by private email, and scheduling acute appointments online when needed.  If you have Medicare related insurance (such as traditional Medicare, [employer] or [employer], or similar), Please make an appointment at the [facility] with Sharee Pimple, the [employer], for your Wellness Visit in this office, which is a benefit with your insurance.  Please return in 1 year for your yearly visit, or sooner if needed

## 2017-01-02 NOTE — Assessment & Plan Note (Signed)
On new statin crestor 20 qd, for f/u lab today, low chol idet o/w stable overall by history and exam, recent data reviewed with pt, and pt to continue medical treatment as before,  to f/u any worsening symptoms or concerns Lab Results  Component Value Date   CHOL 296 (H) 04/28/2016   HDL 76.60 04/28/2016   LDLCALC 205 (H) 04/28/2016   LDLDIRECT 147.4 01/10/2013   TRIG 70.0 04/28/2016   CHOLHDL 4 04/28/2016
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
## 2017-01-02 NOTE — Assessment & Plan Note (Signed)
stable overall by history and exam, recent data reviewed with pt, and pt to continue medical treatment as before,  to f/u any worsening symptoms or concerns Lab Results  Component Value Date   HGBA1C 5.1 01/29/2015   For f/u lab today

## 2017-03-09 DIAGNOSIS — N5201 Erectile dysfunction due to arterial insufficiency: Secondary | ICD-10-CM | POA: Diagnosis not present

## 2017-03-09 DIAGNOSIS — R3121 Asymptomatic microscopic hematuria: Secondary | ICD-10-CM | POA: Diagnosis not present

## 2017-03-20 DIAGNOSIS — R3121 Asymptomatic microscopic hematuria: Secondary | ICD-10-CM | POA: Diagnosis not present

## 2017-03-20 DIAGNOSIS — R3129 Other microscopic hematuria: Secondary | ICD-10-CM | POA: Diagnosis not present

## 2017-03-21 ENCOUNTER — Ambulatory Visit (INDEPENDENT_AMBULATORY_CARE_PROVIDER_SITE_OTHER): Payer: Medicare Other | Admitting: Internal Medicine

## 2017-03-21 VITALS — BP 150/100 | HR 73 | Ht 70.0 in | Wt 220.0 lb

## 2017-03-21 DIAGNOSIS — M79642 Pain in left hand: Secondary | ICD-10-CM | POA: Diagnosis not present

## 2017-03-21 DIAGNOSIS — M79641 Pain in right hand: Secondary | ICD-10-CM

## 2017-03-21 DIAGNOSIS — R7302 Impaired glucose tolerance (oral): Secondary | ICD-10-CM | POA: Diagnosis not present

## 2017-03-21 DIAGNOSIS — E785 Hyperlipidemia, unspecified: Secondary | ICD-10-CM | POA: Diagnosis not present

## 2017-03-21 DIAGNOSIS — R030 Elevated blood-pressure reading, without diagnosis of hypertension: Secondary | ICD-10-CM

## 2017-03-21 NOTE — Progress Notes (Signed)
Subjective:    Patient ID: Matthew Tucker, male    DOB: [birth year], 66 y.o.   MRN: [id number]  HPI  Here to f/u; overall doing ok, Pt denies chest pain, increased sob or doe, wheezing, orthopnea, PND, increased LE swelling, palpitations, dizziness or syncope, but  BP x 4 times in the past wk has been mild elevated (actually the first was severe on initial visit to urology last wk)  Admits to significant stress at the urology visit.  Pt denies new neurological symptoms such as new headache, or facial or extremity weakness or numbness.  Pt denies polydipsia, polyuria,  Pt states overall good compliance with meds, mostly trying to follow appropriate diet, though wt has been somewhat variable., Wt Readings from Last 3 Encounters:  03/21/17 220 lb (99.8 kg)  01/02/17 227 lb (103 kg)  04/28/16 214 lb (97.1 kg)   BP Readings from Last 3 Encounters:  03/21/17 (!) 150/100  01/02/17 130/84  04/28/16 130/70   Past Medical History:  Diagnosis Date  . Anxiety state, unspecified 09/02/2007  . Arthritis   . BACK PAIN, CHRONIC 10/31/2010  . Chronic lumbar radiculopathy 05/20/2015  . Family history of colon cancer 02-11-13   Father died at 76yo  . HIP PAIN, RIGHT 12/02/2007  . HYPERLIPIDEMIA 09/02/2007  . Impaired glucose tolerance 08/29/2011  . Pain in joint, lower leg 12/02/2007  . Pain in Soft Tissues of Limb 12/02/2007  . PARESTHESIA 09/02/2007  . Toxic effect of chlorine gas(987.6) 10/31/2010  . WEAKNESS, RIGHT SIDE OF BODY 02/28/2010   Past Surgical History:  Procedure Laterality Date  . mass removal     back, forehead; benign  . mass removal  2011   head; benign  . s/p lipoma right scalp posteriorly  2011    reports that he has never smoked. He has never used smokeless tobacco. He reports that he does not drink alcohol or use drugs. family history includes Cancer in his mother; Colon cancer (age of onset: 50) in his father; Stroke (age of onset: 70) in his brother. Allergies  Allergen  Reactions  . Levofloxacin Other (See Comments)    Irritates gums   Current Outpatient Prescriptions on File Prior to Visit  Medication Sig Dispense Refill  . aspirin EC 81 MG tablet Take 81 mg by mouth daily.    . rosuvastatin (CRESTOR) 20 MG tablet Take 1 tablet (20 mg total) by mouth daily. 90 tablet 3  . sildenafil (VIAGRA) 100 MG tablet Take 0.5-1 tablets (50-100 mg total) by mouth daily as needed for erectile dysfunction. 5 tablet 11   No current facility-administered medications on file prior to visit.    Review of Systems  Constitutional: Negative for other unusual diaphoresis or sweats HENT: Negative for ear discharge or swelling Eyes: Negative for other worsening visual disturbances Respiratory: Negative for stridor or other swelling  Gastrointestinal: Negative for worsening distension or other blood Genitourinary: Negative for retention or other urinary change Musculoskeletal: Negative for other MSK pain or swelling Skin: Negative for color change or other new lesions Neurological: Negative for worsening tremors and other numbness  Psychiatric/Behavioral: Negative for worsening agitation or other fatigue All other system neg per pt    Objective:   Physical Exam BP (!) 150/100   Pulse 73   Ht 5\' 10"  (1.778 m)   Wt 220 lb (99.8 kg)   SpO2 98%   BMI 31.57 kg/m  VS noted,  Constitutional: Pt appears in NAD HENT: Head: NCAT.  Right Ear:  External ear normal.  Left Ear: External ear normal.  Eyes: . Pupils are equal, round, and reactive to light. Conjunctivae and EOM are normal Nose: without d/c or deformity Neck: Neck supple. Gross normal ROM Cardiovascular: Normal rate and regular rhythm.   Pulmonary/Chest: Effort normal and breath sounds without rales or wheezing.  Bilat hands with mod arthritic changes to DIP and PIP Neurological: Pt is alert. At baseline orientation, motor grossly intact Skin: Skin is warm. No rashes, other new lesions, no LE edema Psychiatric:  Pt behavior is normal without agitation  No other exam findings     Assessment & Plan:

## 2017-03-24 DIAGNOSIS — M79642 Pain in left hand: Secondary | ICD-10-CM

## 2017-03-24 DIAGNOSIS — R030 Elevated blood-pressure reading, without diagnosis of hypertension: Secondary | ICD-10-CM | POA: Insufficient documentation

## 2017-03-24 DIAGNOSIS — M79641 Pain in right hand: Secondary | ICD-10-CM | POA: Insufficient documentation

## 2017-03-24 NOTE — Assessment & Plan Note (Signed)
stable overall by history and exam, recent data reviewed with pt, and pt to continue medical treatment as before,  to f/u any worsening symptoms or concerns Lab Results  Component Value Date   LDLCALC 99 01/02/2017

## 2017-03-24 NOTE — Patient Instructions (Signed)
Please take all new medication as prescribed  - the voltaren gel prn  Please call if you change your mind about starting losartan 50 mg  Please continue all other medications as before, and refills have been done if requested.  Please have the pharmacy call with any other refills you may need.  Please continue your efforts at being more active, low cholesterol diet, and weight control.  Please keep your appointments with your specialists as you may have planned

## 2017-03-24 NOTE — Assessment & Plan Note (Addendum)
I suspect he has new onset HTN, and I asked him to try losartan, but he has convinced himself that his BP has been reactive such as white coat HTN, and does not want to take more pills, to cont to monitor at home, I asked him to call for tx if he changes his mind

## 2017-03-24 NOTE — Assessment & Plan Note (Signed)
C/w DJD pain, for trial volt gel prn,  to f/u any worsening symptoms or concerns

## 2017-03-24 NOTE — Assessment & Plan Note (Signed)
stable overall by history and exam, recent data reviewed with pt, and pt to continue medical treatment as before,  to f/u any worsening symptoms or concerns Lab Results  Component Value Date   HGBA1C 5.5 01/02/2017

## 2017-04-25 DIAGNOSIS — R3121 Asymptomatic microscopic hematuria: Secondary | ICD-10-CM | POA: Diagnosis not present

## 2017-06-29 ENCOUNTER — Other Ambulatory Visit: Payer: Self-pay | Admitting: Internal Medicine

## 2017-08-16 ENCOUNTER — Telehealth: Payer: Self-pay | Admitting: Internal Medicine

## 2017-08-16 NOTE — Telephone Encounter (Signed)
Copied from [place] [id number]. Topic: Referral - Request >> Aug 16, 2017  9:30 AM Yvette Rack wrote: Reason for CRM: patient would like for Dr Jenny Reichmann to put in a referrall for him for a MRI about his spine like he had two years ago to compare please do referral to [facility] pt would like for to know if insurance would pay for this  Please advise if you would like for patient to set up an appointment. He was last seen in July 2018.

## 2017-08-16 NOTE — Telephone Encounter (Signed)
Called pt, LVM.  Ok to discuss follow up. CRM created.

## 2017-08-16 NOTE — Telephone Encounter (Deleted)
Medicare does not require prior approval, but his [employer] secondary insurance likely would require authorization due to the high cost of the test;  Please consider ROV to document need for this

## 2017-08-16 NOTE — Telephone Encounter (Signed)
Medicare does not require prior approval, but his [employer] secondary insurance likely would require authorization due to the high cost of the test;  Please consider ROV to document need for this

## 2017-08-22 ENCOUNTER — Other Ambulatory Visit: Payer: Self-pay | Admitting: Internal Medicine

## 2017-08-22 ENCOUNTER — Other Ambulatory Visit (INDEPENDENT_AMBULATORY_CARE_PROVIDER_SITE_OTHER): Payer: Medicare Other

## 2017-08-22 ENCOUNTER — Encounter: Payer: Self-pay | Admitting: Internal Medicine

## 2017-08-22 ENCOUNTER — Ambulatory Visit (INDEPENDENT_AMBULATORY_CARE_PROVIDER_SITE_OTHER): Payer: Medicare Other | Admitting: Internal Medicine

## 2017-08-22 VITALS — BP 128/86 | HR 79 | Temp 97.9°F | Ht 70.0 in | Wt 221.0 lb

## 2017-08-22 DIAGNOSIS — M4807 Spinal stenosis, lumbosacral region: Secondary | ICD-10-CM

## 2017-08-22 DIAGNOSIS — R7302 Impaired glucose tolerance (oral): Secondary | ICD-10-CM

## 2017-08-22 DIAGNOSIS — Q7649 Other congenital malformations of spine, not associated with scoliosis: Secondary | ICD-10-CM | POA: Insufficient documentation

## 2017-08-22 DIAGNOSIS — Z23 Encounter for immunization: Secondary | ICD-10-CM

## 2017-08-22 DIAGNOSIS — E785 Hyperlipidemia, unspecified: Secondary | ICD-10-CM

## 2017-08-22 LAB — URINALYSIS, ROUTINE W REFLEX MICROSCOPIC
BILIRUBIN URINE: NEGATIVE
Leukocytes, UA: NEGATIVE
NITRITE: NEGATIVE
Specific Gravity, Urine: 1.025 (ref 1.000–1.030)
Total Protein, Urine: NEGATIVE
Urine Glucose: NEGATIVE
Urobilinogen, UA: 0.2 (ref 0.0–1.0)
pH: 6 (ref 5.0–8.0)

## 2017-08-22 LAB — CBC WITH DIFFERENTIAL/PLATELET
BASOS ABS: 0.1 10*3/uL (ref 0.0–0.1)
Basophils Relative: 1 % (ref 0.0–3.0)
EOS ABS: 0.1 10*3/uL (ref 0.0–0.7)
Eosinophils Relative: 1.4 % (ref 0.0–5.0)
HCT: 41.5 % (ref 39.0–52.0)
Hemoglobin: 13.9 g/dL (ref 13.0–17.0)
LYMPHS PCT: 37.9 % (ref 12.0–46.0)
Lymphs Abs: 2.4 10*3/uL (ref 0.7–4.0)
MCHC: 33.6 g/dL (ref 30.0–36.0)
MCV: 90.8 fl (ref 78.0–100.0)
MONO ABS: 0.6 10*3/uL (ref 0.1–1.0)
Monocytes Relative: 9.6 % (ref 3.0–12.0)
NEUTROS PCT: 50.1 % (ref 43.0–77.0)
Neutro Abs: 3.2 10*3/uL (ref 1.4–7.7)
PLATELETS: 207 10*3/uL (ref 150.0–400.0)
RBC: 4.56 Mil/uL (ref 4.22–5.81)
RDW: 12.9 % (ref 11.5–15.5)
WBC: 6.4 10*3/uL (ref 4.0–10.5)

## 2017-08-22 LAB — BASIC METABOLIC PANEL
BUN: 9 mg/dL (ref 6–23)
CO2: 28 meq/L (ref 19–32)
Calcium: 9.3 mg/dL (ref 8.4–10.5)
Chloride: 102 mEq/L (ref 96–112)
Creatinine, Ser: 1.22 mg/dL (ref 0.40–1.50)
GFR: 75.87 mL/min (ref >60.00)
GLUCOSE: 95 mg/dL (ref 70–99)
POTASSIUM: 3.2 meq/L — AB (ref 3.5–5.1)
SODIUM: 139 meq/L (ref 135–145)

## 2017-08-22 LAB — LIPID PANEL
CHOL/HDL RATIO: 2
CHOLESTEROL: 124 mg/dL (ref 0–200)
HDL: 56 mg/dL (ref >39.00)
LDL CALC: 57 mg/dL (ref 0–99)
NonHDL: 68.43
TRIGLYCERIDES: 58 mg/dL (ref 0.0–149.0)
VLDL: 11.6 mg/dL (ref 0.0–40.0)

## 2017-08-22 LAB — TSH: TSH: 2.32 u[IU]/mL (ref 0.35–4.50)

## 2017-08-22 LAB — HEPATIC FUNCTION PANEL
ALBUMIN: 4.2 g/dL (ref 3.5–5.2)
ALT: 20 U/L (ref 0–53)
AST: 23 U/L (ref 0–37)
Alkaline Phosphatase: 74 U/L (ref 39–117)
BILIRUBIN TOTAL: 0.9 mg/dL (ref 0.2–1.2)
Bilirubin, Direct: 0.1 mg/dL (ref 0.0–0.3)
Total Protein: 7.4 g/dL (ref 6.0–8.3)

## 2017-08-22 LAB — HEMOGLOBIN A1C: Hgb A1c MFr Bld: 5.5 % (ref 4.6–6.5)

## 2017-08-22 MED ORDER — SILDENAFIL CITRATE 100 MG PO TABS: 50.0000 mg | ORAL_TABLET | Freq: Every day | ORAL | 11 refills | Status: AC | PRN

## 2017-08-22 MED ORDER — ZOSTER VAC RECOMB ADJUVANTED 50 MCG/0.5ML IM SUSR: 0.5000 mL | Freq: Once | INTRAMUSCULAR | 1 refills | Status: AC

## 2017-08-22 MED ORDER — SILDENAFIL CITRATE 100 MG PO TABS: 50.0000 mg | ORAL_TABLET | Freq: Every day | ORAL | 11 refills | Status: DC | PRN

## 2017-08-22 MED ORDER — IBUPROFEN 400 MG PO TABS: 400.0000 mg | ORAL_TABLET | Freq: Every day | ORAL | 1 refills | Status: DC | PRN

## 2017-08-22 MED ORDER — POTASSIUM CHLORIDE CRYS ER 20 MEQ PO TBCR: 20.0000 meq | EXTENDED_RELEASE_TABLET | Freq: Every day | ORAL | 0 refills | Status: DC

## 2017-08-22 NOTE — Assessment & Plan Note (Signed)
stable overall by history and exam, recent data reviewed with pt, and pt to continue medical treatment as before,  to f/u any worsening symptoms or concerns Lab Results  Component Value Date   HGBA1C 5.5 08/22/2017

## 2017-08-22 NOTE — Assessment & Plan Note (Signed)
With ongoing pain and right lumbar radiculopathy neuro changes -for MRI, and NS referral, pain control

## 2017-08-22 NOTE — Assessment & Plan Note (Signed)
Lab Results  Component Value Date   LDLCALC 57 08/22/2017  stable overall by history and exam, recent data reviewed with pt, and pt to continue medical treatment as before,  to f/u any worsening symptoms or concerns

## 2017-08-22 NOTE — Patient Instructions (Addendum)
You had the flu shot today, and the pneumonia Pneumovax shot today  Your shingles shot was sent to the pharmacy, but remember to ask about the cost before you get it  Please continue all other medications as before, and refills have been done if requested.  Please have the pharmacy call with any other refills you may need.  Please continue your efforts at being more active, low cholesterol diet, and weight control.  You are otherwise up to date with prevention measures today  Please keep your appointments with your specialists as you may have planned  You will be contacted regarding the referral for: MRI and neurosurgury   Please go to the LAB in the Basement (turn left off the elevator) for the tests to be done today  You will be contacted by phone if any changes need to be made immediately.  Otherwise, you will receive a letter about your results with an explanation, but please check with MyChart first.  Please remember to sign up for MyChart if you have not done so, as this will be important to you in the future with finding out test results, communicating by private email, and scheduling acute appointments online when needed.  Please return in 6 months, or sooner if needed

## 2017-08-22 NOTE — Progress Notes (Signed)
Subjective:    Patient ID: Matthew Tucker, male    DOB: [birth year], 66 y.o.   MRN: [id number]  HPI  Here to f/u; overall doing ok,  Pt denies chest pain, increasing sob or doe, wheezing, orthopnea, PND, increased LE swelling, palpitations, dizziness or syncope.  Pt denies new neurological symptoms such as new headache, or facial or extremity weakness or numbness.  Pt denies polydipsia, polyuria, or low sugar episode.  Pt states overall good compliance with meds, mostly trying to follow appropriate diet, with wt overall stable,  but little exercise however, due to chronic pain. Pt continues to have recurring LBP without change in severity, bowel or bladder change, fever, wt loss,  worsening LE pain/numbness/weakness, gait change or falls.  Pt is asking for chiropracter referral [facility], chiropractic) as chronic LBP will likely become worse per surgeon, but Dr Joya Salm NS has left practice in the area.  Pt was told he should ask for f/u MRI and re-referral to the [place] [facility] [place]. Last MRI 2016 with congenital lumbar spinal stenosis with chronic RLE numbness and weakness..  Asks for ibuprofen refill.  Jsut had recent PSA with urology about 2 mo ago.  Past Medical History:  Diagnosis Date  . Anxiety state, unspecified 09/02/2007  . Arthritis   . BACK PAIN, CHRONIC 10/31/2010  . Chronic lumbar radiculopathy 05/20/2015  . Family history of colon cancer 02-02-2013   Father died at 91yo  . HIP PAIN, RIGHT 12/02/2007  . HYPERLIPIDEMIA 09/02/2007  . Impaired glucose tolerance 08/29/2011  . Pain in joint, lower leg 12/02/2007  . Pain in Soft Tissues of Limb 12/02/2007  . PARESTHESIA 09/02/2007  . Toxic effect of chlorine gas(987.6) 10/31/2010  . WEAKNESS, RIGHT SIDE OF BODY 02/28/2010   Past Surgical History:  Procedure Laterality Date  . mass removal     back, forehead; benign  . mass removal  2011   head; benign  . s/p lipoma right scalp posteriorly  2011    reports that  has never  smoked. he has never used smokeless tobacco. He reports that he does not drink alcohol or use drugs. family history includes Cancer in his mother; Colon cancer (age of onset: 41) in his father; Stroke (age of onset: 44) in his brother. Allergies  Allergen Reactions  . Levofloxacin Other (See Comments)    Irritates gums   Current Outpatient Medications on File Prior to Visit  Medication Sig Dispense Refill  . aspirin EC 81 MG tablet Take 81 mg by mouth daily.    . rosuvastatin (CRESTOR) 20 MG tablet Take 1 tablet (20 mg total) by mouth daily. 90 tablet 3  . rosuvastatin (CRESTOR) 20 MG tablet TAKE 1 TABLET(20 MG) BY MOUTH DAILY 90 tablet 1   No current facility-administered medications on file prior to visit.    Review of Systems  Constitutional: Negative for other unusual diaphoresis or sweats HENT: Negative for ear discharge or swelling Eyes: Negative for other worsening visual disturbances Respiratory: Negative for stridor or other swelling  Gastrointestinal: Negative for worsening distension or other blood Genitourinary: Negative for retention or other urinary change Musculoskeletal: Negative for other MSK pain or swelling Skin: Negative for color change or other new lesions Neurological: Negative for worsening tremors and other numbness  Psychiatric/Behavioral: Negative for worsening agitation or other fatigue All other system neg per pt    Objective:   Physical Exam BP 128/86   Pulse 79   Temp 97.9 F (36.6 C) (Oral)  Ht 5\' 10"  (1.778 m)   Wt 221 lb (100.2 kg)   SpO2 100%   BMI 31.71 kg/m  VS noted,  Constitutional: Pt appears in NAD HENT: Head: NCAT.  Right Ear: External ear normal.  Left Ear: External ear normal.  Eyes: . Pupils are equal, round, and reactive to light. Conjunctivae and EOM are normal Nose: without d/c or deformity Neck: Neck supple. Gross normal ROM Cardiovascular: Normal rate and regular rhythm.   Pulmonary/Chest: Effort normal and breath  sounds without rales or wheezing.  Abd:  Soft, NT, ND, + BS, no organomegaly Neurological: Pt is alert. At baseline orientation, motor intact except 4+/5 RLE and decreased sensation to LT Skin: Skin is warm. No rashes, other new lesions, no LE edema Psychiatric: Pt behavior is normal without agitation  No other exam findings Lab Results  Component Value Date   WBC 6.4 08/22/2017   HGB 13.9 08/22/2017   HCT 41.5 08/22/2017   PLT 207.0 08/22/2017   GLUCOSE 95 08/22/2017   CHOL 124 08/22/2017   TRIG 58.0 08/22/2017   HDL 56.00 08/22/2017   LDLDIRECT 147.4 01/10/2013   LDLCALC 57 08/22/2017   ALT 20 08/22/2017   AST 23 08/22/2017   NA 139 08/22/2017   K 3.2 (L) 08/22/2017   CL 102 08/22/2017   CREATININE 1.22 08/22/2017   BUN 9 08/22/2017   CO2 28 08/22/2017   TSH 2.32 08/22/2017   PSA 1.65 04/28/2016   HGBA1C 5.5 08/22/2017       Assessment & Plan:

## 2017-08-23 ENCOUNTER — Telehealth: Payer: Self-pay | Admitting: Internal Medicine

## 2017-08-23 MED ORDER — POTASSIUM CHLORIDE CRYS ER 20 MEQ PO TBCR: EXTENDED_RELEASE_TABLET | ORAL | 0 refills | Status: DC

## 2017-08-23 NOTE — Telephone Encounter (Signed)
Only needs 3 days, so I would not want to change the quantity.  Not sure what to say, except he may do ok without it, but we could consider a follow up blood test next wk if he wants, to check

## 2017-08-23 NOTE — Progress Notes (Signed)
Not sure about the confusion, since the correct rx was sent dec 13  He does not need more than 3 pills  He does not need the 90 pills, and I would not prescribe this even though he states his insurance will pay for 90

## 2017-08-23 NOTE — Telephone Encounter (Signed)
Pt called and does not want to pay for 90 if he is only supposed to take  potassium chloride SA (K-DUR,KLOR-CON) 20 MEQ tablet  For 3 days  Please advise and change QT if ok

## 2017-09-02 ENCOUNTER — Ambulatory Visit
Admission: RE | Admit: 2017-09-02 | Discharge: 2017-09-02 | Disposition: A | Discharge status: Home / Self Care | Payer: Medicare Other | Source: Ambulatory Visit | Attending: Internal Medicine | Admitting: Internal Medicine

## 2017-09-02 DIAGNOSIS — M48061 Spinal stenosis, lumbar region without neurogenic claudication: Secondary | ICD-10-CM | POA: Diagnosis not present

## 2017-09-02 DIAGNOSIS — M4807 Spinal stenosis, lumbosacral region: Secondary | ICD-10-CM

## 2017-09-12 ENCOUNTER — Telehealth: Payer: Self-pay | Admitting: Internal Medicine

## 2017-09-12 NOTE — Telephone Encounter (Signed)
I believe he means a referral to neurosurgury  This was done on Dec 12  If pt is "ready" with his prior MRI, please ask him to speak to [facility] regarding next step

## 2017-09-12 NOTE — Telephone Encounter (Signed)
Referral faxed to [facility] They will contact pt

## 2017-09-12 NOTE — Telephone Encounter (Signed)
Copied from [place]. Topic: Referral - Status >> Sep 12, 2017  3:01 PM Scherrie Gerlach wrote: Reason for CRM: pt would like to proceed to the neurologist as Dr Jenny Reichmann was going to send him after mri. Pt had the MRI they were waiting on.

## 2017-10-02 DIAGNOSIS — Z6829 Body mass index (BMI) 29.0-29.9, adult: Secondary | ICD-10-CM | POA: Diagnosis not present

## 2017-10-02 DIAGNOSIS — R269 Unspecified abnormalities of gait and mobility: Secondary | ICD-10-CM | POA: Diagnosis not present

## 2017-10-02 DIAGNOSIS — I10 Essential (primary) hypertension: Secondary | ICD-10-CM | POA: Diagnosis not present

## 2017-10-10 DIAGNOSIS — M4804 Spinal stenosis, thoracic region: Secondary | ICD-10-CM | POA: Diagnosis not present

## 2017-10-10 DIAGNOSIS — R269 Unspecified abnormalities of gait and mobility: Secondary | ICD-10-CM | POA: Diagnosis not present

## 2017-11-06 ENCOUNTER — Other Ambulatory Visit: Payer: Self-pay | Admitting: Student

## 2017-11-06 DIAGNOSIS — Z6829 Body mass index (BMI) 29.0-29.9, adult: Secondary | ICD-10-CM | POA: Diagnosis not present

## 2017-11-06 DIAGNOSIS — R269 Unspecified abnormalities of gait and mobility: Secondary | ICD-10-CM | POA: Diagnosis not present

## 2017-11-06 DIAGNOSIS — R030 Elevated blood-pressure reading, without diagnosis of hypertension: Secondary | ICD-10-CM | POA: Diagnosis not present

## 2017-11-23 ENCOUNTER — Ambulatory Visit
Admission: RE | Admit: 2017-11-23 | Discharge: 2017-11-23 | Disposition: A | Discharge status: Home / Self Care | Payer: Medicare Other | Source: Ambulatory Visit | Attending: Student | Admitting: Student

## 2017-11-23 DIAGNOSIS — M4802 Spinal stenosis, cervical region: Secondary | ICD-10-CM | POA: Diagnosis not present

## 2017-11-23 DIAGNOSIS — R269 Unspecified abnormalities of gait and mobility: Secondary | ICD-10-CM

## 2017-11-23 DIAGNOSIS — M4804 Spinal stenosis, thoracic region: Secondary | ICD-10-CM | POA: Diagnosis not present

## 2017-11-23 MED ORDER — DIAZEPAM 5 MG PO TABS
5.0000 mg | ORAL_TABLET | Freq: Once | ORAL | Status: AC
  Administered 2017-11-23: 5 mg via ORAL

## 2017-11-23 MED ORDER — ONDANSETRON HCL 4 MG/2ML IJ SOLN
4.0000 mg | Freq: Once | INTRAMUSCULAR | Status: AC
  Administered 2017-11-23: 4 mg via INTRAMUSCULAR

## 2017-11-23 MED ORDER — IOPAMIDOL (ISOVUE-M 300) INJECTION 61%
10.0000 mL | Freq: Once | INTRAMUSCULAR | Status: AC | PRN
  Administered 2017-11-23: 10 mL via INTRATHECAL

## 2017-11-23 MED ORDER — MEPERIDINE HCL 100 MG/ML IJ SOLN
75.0000 mg | Freq: Once | INTRAMUSCULAR | Status: AC
  Administered 2017-11-23: 75 mg via INTRAMUSCULAR

## 2017-11-23 NOTE — Discharge Instructions (Signed)

## 2017-12-04 ENCOUNTER — Other Ambulatory Visit: Payer: Self-pay | Admitting: Neurological Surgery

## 2017-12-04 DIAGNOSIS — M4802 Spinal stenosis, cervical region: Secondary | ICD-10-CM | POA: Diagnosis not present

## 2017-12-04 DIAGNOSIS — Z6828 Body mass index (BMI) 28.0-28.9, adult: Secondary | ICD-10-CM | POA: Diagnosis not present

## 2017-12-04 DIAGNOSIS — R030 Elevated blood-pressure reading, without diagnosis of hypertension: Secondary | ICD-10-CM | POA: Diagnosis not present

## 2017-12-25 NOTE — Pre-Procedure Instructions (Signed)
Matthew Tucker  12/25/2017      [facility] - [address], [address] AT [place] [address] Phone: [phone] Fax: [phone]    Your procedure is scheduled on 01/03/2018.  Report to [facility] Admitting at 0900 A.M.  Call this number if you have problems the morning of surgery:  [phone]   Remember:  Do not eat food or drink liquids after midnight.   Continue all medications as directed by your physician except follow these medication instructions before surgery below   Take these medicines the morning of surgery with A SIP OF WATER: NONE   Do not wear jewelry.  Do not wear lotions, powders, or colognes, or deodorant.  Men may shave face and neck.  Do not bring valuables to the hospital.  [facility] is not responsible for any belongings or valuables.  Hearing aids, eyeglasses, contacts, dentures or bridgework may not be worn into surgery.  Leave your suitcase in the car.  After surgery it may be brought to your room.  For patients admitted to the hospital, discharge time will be determined by your treatment team.  Patients discharged the day of surgery will not be allowed to drive home.   Name and phone number of your driver:    Special instructions:   [place]- Preparing For Surgery  Before surgery, you can play an important role. Because skin is not sterile, your skin needs to be as free of germs as possible. You can reduce the number of germs on your skin by washing with CHG (chlorahexidine gluconate) Soap before surgery.  CHG is an antiseptic cleaner which kills germs and bonds with the skin to continue killing germs even after washing.  Please do not use if you have an allergy to CHG or antibacterial soaps. If your skin becomes reddened/irritated stop using the CHG.  Do not shave (including legs and underarms) for at least 48 hours prior to first CHG shower. It  is OK to shave your face.  Please follow these instructions carefully.   1. Shower the NIGHT BEFORE SURGERY and the MORNING OF SURGERY with CHG.   2. If you chose to wash your hair, wash your hair first as usual with your normal shampoo.  3. After you shampoo, rinse your hair and body thoroughly to remove the shampoo.  4. Use CHG as you would any other liquid soap. You can apply CHG directly to the skin and wash gently with a scrungie or a clean washcloth.   5. Apply the CHG Soap to your body ONLY FROM THE NECK DOWN.  Do not use on open wounds or open sores. Avoid contact with your eyes, ears, mouth and genitals (private parts). Wash Face and genitals (private parts)  with your normal soap.  6. Wash thoroughly, paying special attention to the area where your surgery will be performed.  7. Thoroughly rinse your body with warm water from the neck down.  8. DO NOT shower/wash with your normal soap after using and rinsing off the CHG Soap.  9. Pat yourself dry with a CLEAN TOWEL.  10. Wear CLEAN PAJAMAS to bed the night before surgery, wear comfortable clothes the morning of surgery  11. Place CLEAN SHEETS on your bed the night of your first shower and DO NOT SLEEP WITH PETS.    Day of Surgery: Shower as stated above. Do not apply  any deodorants/lotions.  Please wear clean clothes to the hospital/surgery center.      Please read over the following fact sheets that you were given.

## 2017-12-25 NOTE — Pre-Procedure Instructions (Signed)
Matthew Tucker  12/25/2017      [facility] - [address], [address] AT [address] [place] [address] Phone: [phone] Fax: [phone]    Your procedure is scheduled on Thursday April 25.  Report to [facility] Admitting at 9:00 A.M.  Call this number if you have problems the morning of surgery:  [phone]   Remember:  Do not eat food or drink liquids after midnight.  Take these medicines the morning of surgery with A SIP OF WATER: NONE  7 days prior to surgery STOP taking any Aleve, Naproxen, Ibuprofen, Motrin, Advil, Goody's, BC's, all herbal medications, fish oil, and all vitamins  **FOLLOW your surgeon's instructions on stopping Aspirin. If no instructions were given, please call your surgeon's office**    Do not wear jewelry, make-up or nail polish.  Do not wear lotions, powders, or perfumes, or deodorant.  Do not shave 48 hours prior to surgery.  Men may shave face and neck.  Do not bring valuables to the hospital.  [facility] is not responsible for any belongings or valuables.  Contacts, dentures or bridgework may not be worn into surgery.  Leave your suitcase in the car.  After surgery it may be brought to your room.  For patients admitted to the hospital, discharge time will be determined by your treatment team.  Patients discharged the day of surgery will not be allowed to drive home.   Special instructions:    [place]- Preparing For Surgery  Before surgery, you can play an important role. Because skin is not sterile, your skin needs to be as free of germs as possible. You can reduce the number of germs on your skin by washing with CHG (chlorahexidine gluconate) Soap before surgery.  CHG is an antiseptic cleaner which kills germs and bonds with the skin to continue killing germs even after washing.  Please do not use if you have an allergy to CHG or antibacterial  soaps. If your skin becomes reddened/irritated stop using the CHG.  Do not shave (including legs and underarms) for at least 48 hours prior to first CHG shower. It is OK to shave your face.  Please follow these instructions carefully.   1. Shower the NIGHT BEFORE SURGERY and the MORNING OF SURGERY with CHG.   2. If you chose to wash your hair, wash your hair first as usual with your normal shampoo.  3. After you shampoo, rinse your hair and body thoroughly to remove the shampoo.  4. Use CHG as you would any other liquid soap. You can apply CHG directly to the skin and wash gently with a scrungie or a clean washcloth.   5. Apply the CHG Soap to your body ONLY FROM THE NECK DOWN.  Do not use on open wounds or open sores. Avoid contact with your eyes, ears, mouth and genitals (private parts). Wash Face and genitals (private parts)  with your normal soap.  6. Wash thoroughly, paying special attention to the area where your surgery will be performed.  7. Thoroughly rinse your body with warm water from the neck down.  8. DO NOT shower/wash with your normal soap after using and rinsing off the CHG Soap.  9. Pat yourself dry with a CLEAN TOWEL.  10. Wear CLEAN PAJAMAS to bed the night before surgery, wear comfortable clothes the morning of surgery  11. Place CLEAN SHEETS on  your bed the night of your first shower and DO NOT SLEEP WITH PETS.    Day of Surgery: Do not apply any deodorants/lotions. Please wear clean clothes to the hospital/surgery center.      Please read over the following fact sheets that you were given. Coughing and Deep Breathing, MRSA Information and Surgical Site Infection Prevention

## 2017-12-26 ENCOUNTER — Ambulatory Visit (HOSPITAL_COMMUNITY)
Admission: RE | Admit: 2017-12-26 | Discharge: 2017-12-26 | Disposition: A | Discharge status: Home / Self Care | Payer: Medicare Other | Source: Ambulatory Visit | Attending: Neurological Surgery | Admitting: Neurological Surgery

## 2017-12-26 ENCOUNTER — Other Ambulatory Visit: Payer: Self-pay

## 2017-12-26 ENCOUNTER — Encounter (HOSPITAL_COMMUNITY)
Admission: RE | Admit: 2017-12-26 | Discharge: 2017-12-26 | Disposition: A | Discharge status: Home / Self Care | Payer: Medicare Other | Source: Ambulatory Visit | Attending: Neurological Surgery | Admitting: Neurological Surgery

## 2017-12-26 ENCOUNTER — Encounter (HOSPITAL_COMMUNITY): Payer: Self-pay

## 2017-12-26 DIAGNOSIS — Z01812 Encounter for preprocedural laboratory examination: Secondary | ICD-10-CM | POA: Insufficient documentation

## 2017-12-26 DIAGNOSIS — Z800 Family history of malignant neoplasm of digestive organs: Secondary | ICD-10-CM | POA: Insufficient documentation

## 2017-12-26 DIAGNOSIS — R7302 Impaired glucose tolerance (oral): Secondary | ICD-10-CM | POA: Insufficient documentation

## 2017-12-26 DIAGNOSIS — Z0183 Encounter for blood typing: Secondary | ICD-10-CM | POA: Diagnosis not present

## 2017-12-26 DIAGNOSIS — Z7982 Long term (current) use of aspirin: Secondary | ICD-10-CM | POA: Insufficient documentation

## 2017-12-26 DIAGNOSIS — F419 Anxiety disorder, unspecified: Secondary | ICD-10-CM | POA: Diagnosis not present

## 2017-12-26 DIAGNOSIS — M1990 Unspecified osteoarthritis, unspecified site: Secondary | ICD-10-CM | POA: Diagnosis not present

## 2017-12-26 DIAGNOSIS — E785 Hyperlipidemia, unspecified: Secondary | ICD-10-CM | POA: Insufficient documentation

## 2017-12-26 DIAGNOSIS — I498 Other specified cardiac arrhythmias: Secondary | ICD-10-CM | POA: Diagnosis not present

## 2017-12-26 DIAGNOSIS — M4802 Spinal stenosis, cervical region: Secondary | ICD-10-CM

## 2017-12-26 DIAGNOSIS — Z79899 Other long term (current) drug therapy: Secondary | ICD-10-CM | POA: Diagnosis not present

## 2017-12-26 DIAGNOSIS — M4800 Spinal stenosis, site unspecified: Secondary | ICD-10-CM | POA: Insufficient documentation

## 2017-12-26 DIAGNOSIS — Z01818 Encounter for other preprocedural examination: Secondary | ICD-10-CM | POA: Insufficient documentation

## 2017-12-26 LAB — BASIC METABOLIC PANEL
ANION GAP: 11 (ref 5–15)
BUN: 12 mg/dL (ref 6–20)
CALCIUM: 9 mg/dL (ref 8.9–10.3)
CO2: 22 mmol/L (ref 22–32)
CREATININE: 1.32 mg/dL — AB (ref 0.61–1.24)
Chloride: 106 mmol/L (ref 101–111)
GFR, EST NON AFRICAN AMERICAN: 54 mL/min — AB (ref >60)
Glucose, Bld: 101 mg/dL — ABNORMAL HIGH (ref 65–99)
Potassium: 3.6 mmol/L (ref 3.5–5.1)
SODIUM: 139 mmol/L (ref 135–145)

## 2017-12-26 LAB — CBC WITH DIFFERENTIAL/PLATELET
BASOS ABS: 0 10*3/uL (ref 0.0–0.1)
BASOS PCT: 0 %
Eosinophils Absolute: 0.2 10*3/uL (ref 0.0–0.7)
Eosinophils Relative: 4 %
HCT: 41.1 % (ref 39.0–52.0)
HEMOGLOBIN: 13.7 g/dL (ref 13.0–17.0)
Lymphocytes Relative: 47 %
Lymphs Abs: 2.8 10*3/uL (ref 0.7–4.0)
MCH: 30.4 pg (ref 26.0–34.0)
MCHC: 33.3 g/dL (ref 30.0–36.0)
MCV: 91.1 fL (ref 78.0–100.0)
MONOS PCT: 7 %
Monocytes Absolute: 0.4 10*3/uL (ref 0.1–1.0)
NEUTROS PCT: 42 %
Neutro Abs: 2.5 10*3/uL (ref 1.7–7.7)
Platelets: 166 10*3/uL (ref 150–400)
RBC: 4.51 MIL/uL (ref 4.22–5.81)
RDW: 12.5 % (ref 11.5–15.5)
WBC: 6 10*3/uL (ref 4.0–10.5)

## 2017-12-26 LAB — TYPE AND SCREEN
ABO/RH(D): O POS
Antibody Screen: NEGATIVE

## 2017-12-26 LAB — ABO/RH: ABO/RH(D): O POS

## 2017-12-26 LAB — PROTIME-INR
INR: 1.05
Prothrombin Time: 13.6 seconds (ref 11.4–15.2)

## 2017-12-26 LAB — SURGICAL PCR SCREEN
MRSA, PCR: NEGATIVE
STAPHYLOCOCCUS AUREUS: NEGATIVE

## 2017-12-26 NOTE — Progress Notes (Signed)
PCP - Cathlean Cower Cardiologist - denies  Chest x-ray - 12/26/2017  EKG - 12/26/2017  ECHO - 2007  Aspirin Instructions: Patient has already stopped Aspirin.    Patient denies shortness of breath, fever, cough and chest pain at PAT appointment   Patient verbalized understanding of instructions that were given to them at the PAT appointment. Patient was also instructed that they will need to review over the PAT instructions again at home before surgery.

## 2017-12-27 ENCOUNTER — Encounter (HOSPITAL_COMMUNITY): Payer: Self-pay

## 2017-12-27 NOTE — Progress Notes (Signed)
Anesthesia Chart Review:  Case:  [id number] Date/Time:  01/03/18 1053   Procedure:  Posterior Cervical Fusion with lateral mass fixation - C3 - C7, cervical laminectomy C3-7 (N/A )   Anesthesia type:  General   Pre-op diagnosis:  Stenosis   Location:  [facility] OR ROOM 20 / [facility]   Surgeon:  Eustace Moore, MD     DISCUSSION: Patient is a 67 year old male scheduled for the above procedure. History includes never smoker, HLD, chronic back pain, impaired glucose tolerance, syncope 04/13/14 (in the setting of orthostatic hypotension, dehydration, overheating while looking in a junk yard for parts). Reports ASA on hold.   Patient with known abnormal EKG with cardiology evaluation dating back to 2008, although first viewable EKG in [place] is from 2010. He has had chronic inferior and anterolateral T wave changes since at least 2010 and possibly even dating back to 2007/2008. He has had ongoing primary care follow-up with Dr. Jenny Reichmann, who last saw him in 08/2017 and referred him to neurosurgery this year. He denied chest pain, SOB, cough, fever at PAT. Reviewed above with anesthesiologist Dr. Albertha Ghee. If patient remains asymptomatic from a CV standpoint and otherwise no acute changes then it is anticipated that he can proceed as planned.   VS: BP 134/82   Pulse 83   Temp 37.1 C   Resp 20   Ht 5\' 10"  (1.778 m)   Wt 207 lb 8 oz (94.1 kg)   SpO2 97%   BMI 29.77 kg/m   PROVIDERS: - PCP Biagio Borg, MD. He referred patient to neurosurgery following recent 08/2017 MRI. - He is not followed by cardiology, but was referred to Dr. Dola Argyle (now retired) and was seen on 02/22/07 for abnormal EKG with mild voltage criteria for LVH and non-specific ST/T wave changes. 2007 Echo showed mildly dilated LV, normal LVF and wall motion. He considered an treadmill stress test, but didn't think it was absolutely necessary given his activity tolerance and lack of any significant CV symptoms. (I do not have access to EKG  tracings prior to 01/04/09.)  LABS: Labs reviewed: Acceptable for surgery. Cr 1.32, previously 1.22-1.42 since 01/02/17. (all labs ordered are listed, but only abnormal results are displayed)  Labs Reviewed  BASIC METABOLIC PANEL - Abnormal; Notable for the following components:      Result Value   Glucose, Bld 101 (*)    Creatinine, Ser 1.32 (*)    GFR calc non Af Amer 54 (*)    All other components within normal limits  SURGICAL PCR SCREEN  CBC WITH DIFFERENTIAL/PLATELET  PROTIME-INR  TYPE AND SCREEN  ABO/RH   IMAGES: CXR 12/26/17: IMPRESSION: No active cardiopulmonary disease.  CT T-spine/LP for cervical/thoracic myelogram 11/24/07: IMPRESSION: 1. Moderate to severe multilevel spinal stenosis in the cervical region, C3 through C7. These changes are most severe at C5-6 and C6-7 related to a combination of loss of interspace height, bony overgrowth, short pedicles, and disc material. Reversal of the normal cervical lordotic curve. 2. Cord compression at multiple levels, with abnormal penetration of contrast into the cord suggesting central canal accumulation or contrast within cystic myelomalacia. Consider correlation with cervical spine MRI. 3. Multiple thoracic disc protrusions without significant cord compression. Mild congenital and acquired stenosis due to short pedicles and posterior element hypertrophy. No correlate is seen for the area of abnormal signal on thoracic MRI.  EKG: 12/26/17 NSR with sinus arrhythmia. Minimal voltage criteria for LVH, may be normal variant. T wave  abnormality, consider inferolateral inferolateral ischemia. No significant change since 04/13/14 tracing.  CV: Echo 08/17/06: SUMMARY - The left ventricle was mildly dilated. Overall left ventricular systolic function was normal. Left ventricular ejection fraction was estimated , range being 55 % to 65 %. There were no left ventricular regional wall motion abnormalities. - The aortic root was at the upper  limits of normal in size.  Past Medical History:  Diagnosis Date  . Anxiety state, unspecified 09/02/2007  . Arthritis   . BACK PAIN, CHRONIC 10/31/2010  . Chronic lumbar radiculopathy 05/20/2015  . Family history of colon cancer Feb 07, 2013   Father died at 43yo  . HIP PAIN, RIGHT 12/02/2007  . HYPERLIPIDEMIA 09/02/2007  . Impaired glucose tolerance 08/29/2011  . Pain in joint, lower leg 12/02/2007  . Pain in Soft Tissues of Limb 12/02/2007  . PARESTHESIA 09/02/2007  . Toxic effect of chlorine gas(987.6) 10/31/2010  . WEAKNESS, RIGHT SIDE OF BODY 02/28/2010   Past Surgical History:  Procedure Laterality Date  . BUNIONECTOMY    . FOOT SURGERY Right   . mass removal     back, forehead; benign  . mass removal  2011   head; benign  . s/p lipoma right scalp posteriorly  2011   MEDICATIONS: . aspirin EC 81 MG tablet  . ibuprofen (ADVIL,MOTRIN) 400 MG tablet  . potassium chloride SA (K-DUR,KLOR-CON) 20 MEQ tablet  . rosuvastatin (CRESTOR) 20 MG tablet  . sildenafil (VIAGRA) 100 MG tablet   No current facility-administered medications for this encounter.    George Hugh [facility] Short Stay Center/Anesthesiology Phone [phone] 12/27/2017 5:21 PM

## 2018-01-03 ENCOUNTER — Encounter (HOSPITAL_COMMUNITY): Payer: Self-pay

## 2018-01-03 ENCOUNTER — Inpatient Hospital Stay (HOSPITAL_COMMUNITY): Payer: Medicare Other | Admitting: Vascular Surgery

## 2018-01-03 ENCOUNTER — Inpatient Hospital Stay (HOSPITAL_COMMUNITY)
Admission: RE | Disposition: A | Discharge status: Home / Self Care | Payer: Self-pay | Source: Ambulatory Visit | Attending: Neurological Surgery

## 2018-01-03 ENCOUNTER — Inpatient Hospital Stay (HOSPITAL_COMMUNITY)
Admission: RE | Admit: 2018-01-03 | Discharge: 2018-01-06 | DRG: 473 | Disposition: A | Discharge status: Home / Self Care | Payer: Medicare Other | Attending: Neurological Surgery | Admitting: Neurological Surgery

## 2018-01-03 ENCOUNTER — Inpatient Hospital Stay (HOSPITAL_COMMUNITY): Payer: Medicare Other | Admitting: Certified Registered Nurse Anesthetist

## 2018-01-03 ENCOUNTER — Inpatient Hospital Stay (HOSPITAL_COMMUNITY): Payer: Medicare Other

## 2018-01-03 ENCOUNTER — Other Ambulatory Visit: Payer: Self-pay

## 2018-01-03 DIAGNOSIS — E785 Hyperlipidemia, unspecified: Secondary | ICD-10-CM | POA: Diagnosis present

## 2018-01-03 DIAGNOSIS — Z881 Allergy status to other antibiotic agents status: Secondary | ICD-10-CM | POA: Diagnosis not present

## 2018-01-03 DIAGNOSIS — Z7982 Long term (current) use of aspirin: Secondary | ICD-10-CM | POA: Diagnosis not present

## 2018-01-03 DIAGNOSIS — F411 Generalized anxiety disorder: Secondary | ICD-10-CM | POA: Diagnosis present

## 2018-01-03 DIAGNOSIS — M47812 Spondylosis without myelopathy or radiculopathy, cervical region: Secondary | ICD-10-CM | POA: Diagnosis present

## 2018-01-03 DIAGNOSIS — Z419 Encounter for procedure for purposes other than remedying health state, unspecified: Secondary | ICD-10-CM

## 2018-01-03 DIAGNOSIS — Z823 Family history of stroke: Secondary | ICD-10-CM | POA: Diagnosis not present

## 2018-01-03 DIAGNOSIS — Z800 Family history of malignant neoplasm of digestive organs: Secondary | ICD-10-CM

## 2018-01-03 DIAGNOSIS — M1990 Unspecified osteoarthritis, unspecified site: Secondary | ICD-10-CM | POA: Diagnosis present

## 2018-01-03 DIAGNOSIS — R030 Elevated blood-pressure reading, without diagnosis of hypertension: Secondary | ICD-10-CM | POA: Diagnosis not present

## 2018-01-03 DIAGNOSIS — M4322 Fusion of spine, cervical region: Secondary | ICD-10-CM | POA: Diagnosis not present

## 2018-01-03 DIAGNOSIS — M4302 Spondylolysis, cervical region: Secondary | ICD-10-CM | POA: Diagnosis not present

## 2018-01-03 DIAGNOSIS — M4802 Spinal stenosis, cervical region: Secondary | ICD-10-CM | POA: Diagnosis present

## 2018-01-03 DIAGNOSIS — Z888 Allergy status to other drugs, medicaments and biological substances status: Secondary | ICD-10-CM

## 2018-01-03 HISTORY — DX: Spinal stenosis, cervical region: M48.02

## 2018-01-03 HISTORY — PX: POSTERIOR CERVICAL FUSION/FORAMINOTOMY: SHX5038

## 2018-01-03 SURGERY — POSTERIOR CERVICAL FUSION/FORAMINOTOMY LEVEL 4
Anesthesia: General

## 2018-01-03 MED ORDER — VANCOMYCIN HCL 1000 MG IV SOLR
INTRAVENOUS | Status: DC | PRN
  Administered 2018-01-03: 1000 mg

## 2018-01-03 MED ORDER — ROCURONIUM BROMIDE 10 MG/ML (PF) SYRINGE
PREFILLED_SYRINGE | INTRAVENOUS | Status: DC | PRN
  Administered 2018-01-03 (×2): 20 mg via INTRAVENOUS
  Administered 2018-01-03: 50 mg via INTRAVENOUS

## 2018-01-03 MED ORDER — DEXTROSE 5 % IV SOLN
INTRAVENOUS | Status: DC | PRN
  Administered 2018-01-03: 15 ug/min via INTRAVENOUS

## 2018-01-03 MED ORDER — METHOCARBAMOL 500 MG PO TABS
ORAL_TABLET | ORAL | Status: AC
  Filled 2018-01-03: qty 1

## 2018-01-03 MED ORDER — VANCOMYCIN HCL 1000 MG IV SOLR
INTRAVENOUS | Status: AC
  Filled 2018-01-03: qty 1000

## 2018-01-03 MED ORDER — SODIUM CHLORIDE 0.9 % IV SOLN
INTRAVENOUS | Status: DC | PRN
  Administered 2018-01-03: 12:00:00

## 2018-01-03 MED ORDER — HYDRALAZINE HCL 20 MG/ML IJ SOLN: 5.0000 mg | INTRAMUSCULAR | Status: DC | PRN

## 2018-01-03 MED ORDER — SODIUM CHLORIDE 0.9% FLUSH: 3.0000 mL | INTRAVENOUS | Status: DC | PRN

## 2018-01-03 MED ORDER — OXYCODONE HCL 5 MG/5ML PO SOLN: 5.0000 mg | Freq: Once | ORAL | Status: AC | PRN

## 2018-01-03 MED ORDER — THROMBIN (RECOMBINANT) 20000 UNITS EX SOLR
CUTANEOUS | Status: DC | PRN
  Administered 2018-01-03: 12:00:00 via TOPICAL

## 2018-01-03 MED ORDER — METHOCARBAMOL 1000 MG/10ML IJ SOLN
500.0000 mg | Freq: Four times a day (QID) | INTRAVENOUS | Status: DC | PRN
  Filled 2018-01-03: qty 5

## 2018-01-03 MED ORDER — CEFAZOLIN SODIUM-DEXTROSE 2-4 GM/100ML-% IV SOLN
2.0000 g | INTRAVENOUS | Status: AC
  Administered 2018-01-03: 2 g via INTRAVENOUS

## 2018-01-03 MED ORDER — ONDANSETRON HCL 4 MG/2ML IJ SOLN
INTRAMUSCULAR | Status: DC | PRN
  Administered 2018-01-03: 4 mg via INTRAVENOUS

## 2018-01-03 MED ORDER — MEPERIDINE HCL 50 MG/ML IJ SOLN
6.2500 mg | INTRAMUSCULAR | Status: DC | PRN
  Administered 2018-01-03: 6.25 mg via INTRAVENOUS

## 2018-01-03 MED ORDER — CEFAZOLIN SODIUM-DEXTROSE 2-4 GM/100ML-% IV SOLN
2.0000 g | Freq: Three times a day (TID) | INTRAVENOUS | Status: AC
  Administered 2018-01-03 – 2018-01-04 (×2): 2 g via INTRAVENOUS
  Filled 2018-01-03 (×2): qty 100

## 2018-01-03 MED ORDER — ACETAMINOPHEN 650 MG RE SUPP: 650.0000 mg | RECTAL | Status: DC | PRN

## 2018-01-03 MED ORDER — HYDROMORPHONE HCL 1 MG/ML IJ SOLN
0.5000 mg | INTRAMUSCULAR | Status: DC | PRN
  Administered 2018-01-03 – 2018-01-05 (×2): 0.5 mg via INTRAVENOUS
  Filled 2018-01-03 (×2): qty 1

## 2018-01-03 MED ORDER — OXYCODONE HCL 5 MG PO TABS
ORAL_TABLET | ORAL | Status: AC
  Filled 2018-01-03: qty 1

## 2018-01-03 MED ORDER — THROMBIN (RECOMBINANT) 5000 UNITS EX SOLR
OROMUCOSAL | Status: DC | PRN
  Administered 2018-01-03: 12:00:00 via TOPICAL

## 2018-01-03 MED ORDER — BACITRACIN ZINC 500 UNIT/GM EX OINT
TOPICAL_OINTMENT | CUTANEOUS | Status: AC
  Filled 2018-01-03: qty 28.35

## 2018-01-03 MED ORDER — PROMETHAZINE HCL 25 MG/ML IJ SOLN: 6.2500 mg | INTRAMUSCULAR | Status: DC | PRN

## 2018-01-03 MED ORDER — THROMBIN 5000 UNITS EX SOLR
CUTANEOUS | Status: AC
  Filled 2018-01-03: qty 5000

## 2018-01-03 MED ORDER — PROPOFOL 10 MG/ML IV BOLUS
INTRAVENOUS | Status: DC | PRN
  Administered 2018-01-03: 20 mg via INTRAVENOUS
  Administered 2018-01-03: 100 mg via INTRAVENOUS
  Administered 2018-01-03: 180 mg via INTRAVENOUS

## 2018-01-03 MED ORDER — ARTIFICIAL TEARS OPHTHALMIC OINT
TOPICAL_OINTMENT | OPHTHALMIC | Status: DC | PRN
  Administered 2018-01-03: 1 via OPHTHALMIC

## 2018-01-03 MED ORDER — CHLORHEXIDINE GLUCONATE CLOTH 2 % EX PADS: 6.0000 | MEDICATED_PAD | Freq: Once | CUTANEOUS | Status: DC

## 2018-01-03 MED ORDER — MIDAZOLAM HCL 5 MG/5ML IJ SOLN
INTRAMUSCULAR | Status: DC | PRN
  Administered 2018-01-03: 2 mg via INTRAVENOUS

## 2018-01-03 MED ORDER — DEXAMETHASONE SODIUM PHOSPHATE 10 MG/ML IJ SOLN
INTRAMUSCULAR | Status: AC
  Filled 2018-01-03: qty 1

## 2018-01-03 MED ORDER — BUPIVACAINE HCL (PF) 0.25 % IJ SOLN
INTRAMUSCULAR | Status: DC | PRN
  Administered 2018-01-03: 7 mL

## 2018-01-03 MED ORDER — FENTANYL CITRATE (PF) 250 MCG/5ML IJ SOLN
INTRAMUSCULAR | Status: AC
  Filled 2018-01-03: qty 5

## 2018-01-03 MED ORDER — SUGAMMADEX SODIUM 200 MG/2ML IV SOLN
INTRAVENOUS | Status: DC | PRN
  Administered 2018-01-03: 200 mg via INTRAVENOUS

## 2018-01-03 MED ORDER — ROCURONIUM BROMIDE 10 MG/ML (PF) SYRINGE
PREFILLED_SYRINGE | INTRAVENOUS | Status: AC
  Filled 2018-01-03: qty 5

## 2018-01-03 MED ORDER — FENTANYL CITRATE (PF) 100 MCG/2ML IJ SOLN
INTRAMUSCULAR | Status: DC | PRN
  Administered 2018-01-03 (×2): 50 ug via INTRAVENOUS
  Administered 2018-01-03: 100 ug via INTRAVENOUS

## 2018-01-03 MED ORDER — CEFAZOLIN SODIUM-DEXTROSE 2-4 GM/100ML-% IV SOLN
INTRAVENOUS | Status: AC
  Filled 2018-01-03: qty 100

## 2018-01-03 MED ORDER — LACTATED RINGERS IV SOLN
INTRAVENOUS | Status: DC
  Administered 2018-01-03 (×2): via INTRAVENOUS

## 2018-01-03 MED ORDER — POTASSIUM CHLORIDE IN NACL 20-0.9 MEQ/L-% IV SOLN: INTRAVENOUS | Status: DC

## 2018-01-03 MED ORDER — ONDANSETRON HCL 4 MG PO TABS: 4.0000 mg | ORAL_TABLET | Freq: Four times a day (QID) | ORAL | Status: DC | PRN

## 2018-01-03 MED ORDER — METHOCARBAMOL 500 MG PO TABS
500.0000 mg | ORAL_TABLET | Freq: Four times a day (QID) | ORAL | Status: DC | PRN
  Administered 2018-01-03 – 2018-01-06 (×6): 500 mg via ORAL
  Filled 2018-01-03 (×5): qty 1

## 2018-01-03 MED ORDER — SUGAMMADEX SODIUM 200 MG/2ML IV SOLN
INTRAVENOUS | Status: AC
  Filled 2018-01-03: qty 2

## 2018-01-03 MED ORDER — SODIUM CHLORIDE 0.9 % IV SOLN: 250.0000 mL | INTRAVENOUS | Status: DC

## 2018-01-03 MED ORDER — OXYCODONE HCL 5 MG PO TABS
5.0000 mg | ORAL_TABLET | Freq: Once | ORAL | Status: AC | PRN
  Administered 2018-01-03: 5 mg via ORAL

## 2018-01-03 MED ORDER — SENNA 8.6 MG PO TABS
1.0000 | ORAL_TABLET | Freq: Two times a day (BID) | ORAL | Status: DC
  Administered 2018-01-03 – 2018-01-06 (×6): 8.6 mg via ORAL
  Filled 2018-01-03 (×6): qty 1

## 2018-01-03 MED ORDER — ONDANSETRON HCL 4 MG/2ML IJ SOLN: 4.0000 mg | Freq: Four times a day (QID) | INTRAMUSCULAR | Status: DC | PRN

## 2018-01-03 MED ORDER — MENTHOL 3 MG MT LOZG: 1.0000 | LOZENGE | OROMUCOSAL | Status: DC | PRN

## 2018-01-03 MED ORDER — OXYCODONE HCL 5 MG PO TABS
10.0000 mg | ORAL_TABLET | ORAL | Status: DC | PRN
  Administered 2018-01-03 – 2018-01-06 (×12): 10 mg via ORAL
  Filled 2018-01-03 (×12): qty 2

## 2018-01-03 MED ORDER — 0.9 % SODIUM CHLORIDE (POUR BTL) OPTIME
TOPICAL | Status: DC | PRN
  Administered 2018-01-03: 1000 mL

## 2018-01-03 MED ORDER — BUPIVACAINE HCL (PF) 0.25 % IJ SOLN
INTRAMUSCULAR | Status: AC
Start: 2018-01-03 — End: 2018-01-03
  Filled 2018-01-03: qty 30

## 2018-01-03 MED ORDER — DEXAMETHASONE 4 MG PO TABS
4.0000 mg | ORAL_TABLET | Freq: Four times a day (QID) | ORAL | Status: DC
  Administered 2018-01-03 – 2018-01-06 (×10): 4 mg via ORAL
  Filled 2018-01-03 (×9): qty 1

## 2018-01-03 MED ORDER — LIDOCAINE 2% (20 MG/ML) 5 ML SYRINGE
INTRAMUSCULAR | Status: AC
  Filled 2018-01-03: qty 5

## 2018-01-03 MED ORDER — LIDOCAINE 2% (20 MG/ML) 5 ML SYRINGE
INTRAMUSCULAR | Status: DC | PRN
  Administered 2018-01-03: 100 mg via INTRAVENOUS

## 2018-01-03 MED ORDER — ONDANSETRON HCL 4 MG/2ML IJ SOLN
INTRAMUSCULAR | Status: AC
  Filled 2018-01-03: qty 2

## 2018-01-03 MED ORDER — PHENOL 1.4 % MT LIQD: 1.0000 | OROMUCOSAL | Status: DC | PRN

## 2018-01-03 MED ORDER — DEXAMETHASONE SODIUM PHOSPHATE 4 MG/ML IJ SOLN
4.0000 mg | Freq: Four times a day (QID) | INTRAMUSCULAR | Status: DC
  Administered 2018-01-05 (×2): 4 mg via INTRAVENOUS
  Filled 2018-01-03 (×2): qty 1

## 2018-01-03 MED ORDER — HYDROMORPHONE HCL 2 MG/ML IJ SOLN
0.2500 mg | INTRAMUSCULAR | Status: DC | PRN
  Administered 2018-01-03 (×4): 0.5 mg via INTRAVENOUS

## 2018-01-03 MED ORDER — PROPOFOL 10 MG/ML IV BOLUS
INTRAVENOUS | Status: AC
  Filled 2018-01-03: qty 20

## 2018-01-03 MED ORDER — THROMBIN 20000 UNITS EX SOLR
CUTANEOUS | Status: AC
  Filled 2018-01-03: qty 20000

## 2018-01-03 MED ORDER — MEPERIDINE HCL 50 MG/ML IJ SOLN
INTRAMUSCULAR | Status: AC
  Filled 2018-01-03: qty 1

## 2018-01-03 MED ORDER — MIDAZOLAM HCL 2 MG/2ML IJ SOLN
INTRAMUSCULAR | Status: AC
  Filled 2018-01-03: qty 2

## 2018-01-03 MED ORDER — SODIUM CHLORIDE 0.9% FLUSH
3.0000 mL | Freq: Two times a day (BID) | INTRAVENOUS | Status: DC
  Administered 2018-01-03 – 2018-01-05 (×5): 3 mL via INTRAVENOUS

## 2018-01-03 MED ORDER — DEXAMETHASONE SODIUM PHOSPHATE 10 MG/ML IJ SOLN
10.0000 mg | INTRAMUSCULAR | Status: AC
Start: 2018-01-03 — End: 2018-01-03
  Administered 2018-01-03: 10 mg via INTRAVENOUS

## 2018-01-03 MED ORDER — HYDROMORPHONE HCL 2 MG/ML IJ SOLN
INTRAMUSCULAR | Status: AC
  Filled 2018-01-03: qty 1

## 2018-01-03 MED ORDER — ACETAMINOPHEN 325 MG PO TABS: 650.0000 mg | ORAL_TABLET | ORAL | Status: DC | PRN

## 2018-01-03 SURGICAL SUPPLY — 61 items
BAG DECANTER FOR FLEXI CONT (MISCELLANEOUS) ×3 IMPLANT
BENZOIN TINCTURE PRP APPL 2/3 (GAUZE/BANDAGES/DRESSINGS) ×3 IMPLANT
BIT DRILL SCRW 3.5 (BIT) ×3 IMPLANT
BLADE CLIPPER SURG (BLADE) IMPLANT
BUR MATCHSTICK NEURO 3.0 LAGG (BURR) ×3 IMPLANT
CANISTER SUCT 3000ML PPV (MISCELLANEOUS) ×3 IMPLANT
CAP LOCKING (Cap) ×10 IMPLANT
CARTRIDGE OIL MAESTRO DRILL (MISCELLANEOUS) ×1 IMPLANT
CLOSURE WOUND 1/2 X4 (GAUZE/BANDAGES/DRESSINGS) ×1
DIFFUSER DRILL AIR PNEUMATIC (MISCELLANEOUS) ×3 IMPLANT
DRAPE C-ARM 42X72 X-RAY (DRAPES) ×6 IMPLANT
DRAPE LAPAROTOMY 100X72 PEDS (DRAPES) ×3 IMPLANT
DRAPE POUCH INSTRU U-SHP 10X18 (DRAPES) ×3 IMPLANT
DRSG OPSITE POSTOP 4X6 (GAUZE/BANDAGES/DRESSINGS) ×3 IMPLANT
DURAPREP 6ML APPLICATOR 50/CS (WOUND CARE) ×3 IMPLANT
ELECT REM PT RETURN 9FT ADLT (ELECTROSURGICAL) ×3
ELECTRODE REM PT RTRN 9FT ADLT (ELECTROSURGICAL) ×1 IMPLANT
EVACUATOR 1/8 PVC DRAIN (DRAIN) ×3 IMPLANT
FIBER BOAT ALLOFUSE 7.5CC (Bone Implant) ×3 IMPLANT
GAUZE SPONGE 4X4 12PLY STRL (GAUZE/BANDAGES/DRESSINGS) ×3 IMPLANT
GAUZE SPONGE 4X4 16PLY XRAY LF (GAUZE/BANDAGES/DRESSINGS) IMPLANT
GLOVE BIO SURGEON STRL SZ7 (GLOVE) IMPLANT
GLOVE BIO SURGEON STRL SZ8 (GLOVE) ×3 IMPLANT
GLOVE BIOGEL PI IND STRL 7.0 (GLOVE) IMPLANT
GLOVE BIOGEL PI INDICATOR 7.0 (GLOVE)
GLOVE EXAM NITRILE LRG STRL (GLOVE) IMPLANT
GLOVE EXAM NITRILE XL STR (GLOVE) IMPLANT
GLOVE EXAM NITRILE XS STR PU (GLOVE) IMPLANT
GOWN STRL REUS W/ TWL LRG LVL3 (GOWN DISPOSABLE) IMPLANT
GOWN STRL REUS W/ TWL XL LVL3 (GOWN DISPOSABLE) ×1 IMPLANT
GOWN STRL REUS W/TWL 2XL LVL3 (GOWN DISPOSABLE) IMPLANT
GOWN STRL REUS W/TWL LRG LVL3 (GOWN DISPOSABLE)
GOWN STRL REUS W/TWL XL LVL3 (GOWN DISPOSABLE) ×2
HEMOSTAT POWDER KIT SURGIFOAM (HEMOSTASIS) IMPLANT
IMPL QUARTEX 3.5X12MM (Neuro Prosthesis/Implant) ×3 IMPLANT
IMPL QUARTEX 3.5X14MM (Neuro Prosthesis/Implant) ×7 IMPLANT
IMPLANT QUARTEX 3.5X12MM (Neuro Prosthesis/Implant) ×9 IMPLANT
IMPLANT QUARTEX 3.5X14MM (Neuro Prosthesis/Implant) ×21 IMPLANT
KIT BASIN OR (CUSTOM PROCEDURE TRAY) ×3 IMPLANT
KIT TURNOVER KIT B (KITS) ×3 IMPLANT
LOCKING CAP (Cap) ×30 IMPLANT
MARKER SKIN DUAL TIP RULER LAB (MISCELLANEOUS) ×3 IMPLANT
NEEDLE HYPO 18GX1.5 BLUNT FILL (NEEDLE) IMPLANT
NEEDLE HYPO 25X1 1.5 SAFETY (NEEDLE) ×3 IMPLANT
NEEDLE SPNL 20GX3.5 QUINCKE YW (NEEDLE) ×3 IMPLANT
NS IRRIG 1000ML POUR BTL (IV SOLUTION) ×3 IMPLANT
OIL CARTRIDGE MAESTRO DRILL (MISCELLANEOUS) ×3
PACK LAMINECTOMY NEURO (CUSTOM PROCEDURE TRAY) ×3 IMPLANT
PIN MAYFIELD SKULL DISP (PIN) ×3 IMPLANT
ROD CVD 4.0X60 (Rod) ×6 IMPLANT
SPONGE SURGIFOAM ABS GEL 100 (HEMOSTASIS) ×3 IMPLANT
STRIP CLOSURE SKIN 1/2X4 (GAUZE/BANDAGES/DRESSINGS) ×2 IMPLANT
SUT VIC AB 0 CT1 18XCR BRD8 (SUTURE) ×1 IMPLANT
SUT VIC AB 0 CT1 8-18 (SUTURE) ×2
SUT VIC AB 2-0 CP2 18 (SUTURE) ×6 IMPLANT
SUT VIC AB 3-0 SH 8-18 (SUTURE) IMPLANT
TOWEL GREEN STERILE (TOWEL DISPOSABLE) ×3 IMPLANT
TOWEL GREEN STERILE FF (TOWEL DISPOSABLE) ×3 IMPLANT
TRAY FOLEY W/METER SILVER 16FR (SET/KITS/TRAYS/PACK) IMPLANT
UNDERPAD 30X30 (UNDERPADS AND DIAPERS) ×3 IMPLANT
WATER STERILE IRR 1000ML POUR (IV SOLUTION) ×3 IMPLANT

## 2018-01-03 NOTE — H&P (Signed)
Subjective:   Patient is a 67 y.o. male admitted for posterior cervical decompression and fusion. The patient first presented to me with complaints of neck pain and numbness of the arm(s) with change in gait. Onset of symptoms was several months ago. The pain is described as aching and occurs intermittently. The pain is rated moderate, and is located in the neck. The symptoms have been progressive. Symptoms are exacerbated by extending head backwards, and are relieved by none.  Previous work up includes MRI of cervical spine, results: spinal stenosis.  Past Medical History:  Diagnosis Date  . Anxiety state, unspecified 09/02/2007  . Arthritis   . BACK PAIN, CHRONIC 10/31/2010  . Cervical stenosis of spine   . Chronic lumbar radiculopathy 05/20/2015  . Family history of colon cancer 01-19-2013   Father died at 63yo  . HIP PAIN, RIGHT 12/02/2007  . HYPERLIPIDEMIA 09/02/2007  . Impaired glucose tolerance 08/29/2011  . Pain in joint, lower leg 12/02/2007  . Pain in Soft Tissues of Limb 12/02/2007  . PARESTHESIA 09/02/2007  . Toxic effect of chlorine gas(987.6) 10/31/2010  . WEAKNESS, RIGHT SIDE OF BODY 02/28/2010    Past Surgical History:  Procedure Laterality Date  . BUNIONECTOMY    . FOOT SURGERY Right   . mass removal     back, forehead; benign (lipoma)   . mass removal  2011   head; benign  . s/p lipoma right scalp posteriorly  2011    Allergies  Allergen Reactions  . Levofloxacin Other (See Comments)    Irritates gums    Social History   Tobacco Use  . Smoking status: Never Smoker  . Smokeless tobacco: Never Used  Substance Use Topics  . Alcohol use: Yes    Alcohol/week: 0.0 oz    Comment: occasional    Family History  Problem Relation Age of Onset  . Cancer Mother        colon cancer  . Colon cancer Father 64  . Stroke Brother 11  . Esophageal cancer Neg Hx   . Stomach cancer Neg Hx    Prior to Admission medications   Medication Sig Start Date End Date Taking?  Authorizing Provider  aspirin EC 81 MG tablet Take 81 mg by mouth daily.   Yes [provider]  ibuprofen (ADVIL,MOTRIN) 400 MG tablet Take 1 tablet (400 mg total) by mouth daily as needed. Patient taking differently: Take 400 mg by mouth daily as needed (for arthritis).  08/22/17   Biagio Borg, MD  potassium chloride SA (K-DUR,KLOR-CON) 20 MEQ tablet TAKE 1 TABLET BY MOUTH ONCE DAILY FOR 3 DAYS Patient not taking: Reported on 12/20/2017 08/23/17   Biagio Borg, MD  rosuvastatin (CRESTOR) 20 MG tablet TAKE 1 TABLET(20 MG) BY MOUTH DAILY 06/29/17   Biagio Borg, MD  sildenafil (VIAGRA) 100 MG tablet Take 0.5-1 tablets (50-100 mg total) by mouth daily as needed for erectile dysfunction. 08/22/17   Biagio Borg, MD     Review of Systems  Positive ROS: neg  All other systems have been reviewed and were otherwise negative with the exception of those mentioned in the HPI and as above.  Objective: Vital signs in last 24 hours: Temp:  [98.3 F (36.8 C)] 98.3 F (36.8 C) (04/25 0912) Pulse Rate:  [65] 65 (04/25 0912) Resp:  [20] 20 (04/25 0912) BP: (157)/(92) 157/92 (04/25 0912) SpO2:  [100 %] 100 % (04/25 0912) Weight:  [93.9 kg (207 lb)] 93.9 kg (207 lb) (04/25 0912)  General Appearance: Alert, cooperative, no distress, appears stated age Head: Normocephalic, without obvious abnormality, atraumatic Eyes: PERRL, conjunctiva/corneas clear, EOM's intact      Neck: Supple, symmetrical, trachea midline, Back: Symmetric, no curvature, ROM normal, no CVA tenderness Lungs:  respirations unlabored Heart: Regular rate and rhythm Abdomen: Soft, non-tender Extremities: Extremities normal, atraumatic, no cyanosis or edema Pulses: 2+ and symmetric all extremities Skin: Skin color, texture, turgor normal, no rashes or lesions  NEUROLOGIC:  Mental status: Alert and oriented x4, no aphasia, good attention span, fund of knowledge and memory  Motor Exam - grossly normal Sensory Exam -  grossly normal Reflexes: 2+ Coordination - grossly normal Gait - grossly normal Balance - grossly normal Cranial Nerves: I: smell Not tested  II: visual acuity  OS: nl    OD: nl  II: visual fields Full to confrontation  II: pupils Equal, round, reactive to light  III,VII: ptosis None  III,IV,VI: extraocular muscles  Full ROM  V: mastication Normal  V: facial light touch sensation  Normal  V,VII: corneal reflex  Present  VII: facial muscle function - upper  Normal  VII: facial muscle function - lower Normal  VIII: hearing Not tested  IX: soft palate elevation  Normal  IX,X: gag reflex Present  XI: trapezius strength  5/5  XI: sternocleidomastoid strength 5/5  XI: neck flexion strength  5/5  XII: tongue strength  Normal    Data Review Lab Results  Component Value Date   WBC 6.0 12/26/2017   HGB 13.7 12/26/2017   HCT 41.1 12/26/2017   MCV 91.1 12/26/2017   PLT 166 12/26/2017   Lab Results  Component Value Date   NA 139 12/26/2017   K 3.6 12/26/2017   CL 106 12/26/2017   CO2 22 12/26/2017   BUN 12 12/26/2017   CREATININE 1.32 (H) 12/26/2017   GLUCOSE 101 (H) 12/26/2017   Lab Results  Component Value Date   INR 1.05 12/26/2017    Assessment:   Cervical neck pain with herniated nucleus pulposus/ spondylosis/ stenosis at C3 C7. Estimated body mass index is 29.7 kg/m as calculated from the following:   Height as of this encounter: 5\' 10"  (1.778 m).   Weight as of this encounter: 93.9 kg (207 lb).  Patient has failed conservative therapy. Planned surgery : C3-C7 posterior cervical decompression and fusion with instrumentation  Plan:   I explained the condition and procedure to the patient and answered any questions.  Patient wishes to proceed with procedure as planned. Understands risks/ benefits/ and expected or typical outcomes.  Leor Whyte S 01/03/2018 11:06 AM

## 2018-01-03 NOTE — Transfer of Care (Signed)
Immediate Anesthesia Transfer of Care Note  Patient: Matthew Tucker  Procedure(s) Performed: Posterior Cervical Fusion with lateral mass fixation - Cervical three - Cervical seven, cervical laminectomy Cervical three-cervical seven (N/A )  Patient Location: PACU  Anesthesia Type:General  Level of Consciousness: awake, alert , oriented and patient cooperative  Airway & Oxygen Therapy: Patient Spontanous Breathing and Patient connected to face mask oxygen  Post-op Assessment: Report given to RN, Post -op Vital signs reviewed and stable and Patient moving all extremities X 4  Post vital signs: Reviewed and stable  Last Vitals:  Vitals Value Taken Time  BP 141/89 01/03/2018  2:20 PM  Temp    Pulse 68 01/03/2018  2:22 PM  Resp 17 01/03/2018  2:22 PM  SpO2 100 % 01/03/2018  2:22 PM  Vitals shown include unvalidated device data.  Last Pain:  Vitals:   01/03/18 0940  TempSrc:   PainSc: 0-No pain         Complications: No apparent anesthesia complications

## 2018-01-03 NOTE — Op Note (Signed)
01/03/2018  2:10 PM  PATIENT:  Matthew Tucker  67 y.o. male  PRE-OPERATIVE DIAGNOSIS:  Cervical spondylosis with cervical spinal stenosis C3-4 C4-5 C5-6 and C6-7  POST-OPERATIVE DIAGNOSIS:  same  PROCEDURE:  1. Decompressive cervical laminectomy medial facetectomy C3-4 C4-5 C5-6 C6-7, 2. Posterior cervical fusion C3-C7 utilizing locally harvested morcellized autograft and morcellized allograft, 3. Segmental fixation C3-C7 utilizing globus lateral mass screws  SURGEON:  Sherley Bounds, MD  ASSISTANTS: Dr Sherwood Gambler  ANESTHESIA:   General  EBL: 50 ml  Total I/O In: 1000 [I.V.:1000] Out: 50 [Blood:50]  BLOOD ADMINISTERED: none  DRAINS: Medium Hemovac  SPECIMEN:  none  INDICATION FOR PROCEDURE: This patient presented with gait disturbance. Imaging showed severe spinal stenosis C3-C7. The patient tried conservative measures without relief. Pain was debilitating. Recommended decompressive cervical laminectomy with lateral mass fusion. Patient understood the risks, benefits, and alternatives and potential outcomes and wished to proceed.  PROCEDURE DETAILS: The patient was brought to the operating room. Generalized endotracheal anesthesia was induced. The patient was affixed a 3 point [place] headrest and rolled into the prone position on chest rolls. All pressure points were padded. The posterior cervical region was cleaned and prepped with DuraPrep and then draped in the usual sterile fashion. 7 cc of local anesthesia was injected and a dorsal midline incision made in the posterior cervical region and carried down to the cervical fascia. The fascia was opened and the paraspinous musculature was taken down to expose C3-C7. Intraoperative fluoroscopy confirmed my level and then the dissection was carried out over the lateral facets. I localized the midpoint of each lateral mass and marked a region 1 mm medial to the midpoint of the lateral mass, and then drilled in an upward and outward  direction into the safe zone of each lateral mass. I drilled to a depth of 14 mm and then checked my drill hole with a ball probe. I then placed a 14 mm lateral mass screws into the safe zone of each lateral mass of C3-C7 inclusive until they were 2 fingers tight. I then gently decompressed the central canal with the 1 and 2 mm Kerrison punch from C3-4 to the top half of C7. Medial facetectomies were performed, and foraminotomies were performed at each level. Once the decompression was complete the dura was full and capacious and I could see the spinal cord pulsatile through the dura. I then decorticated the lateral masses and the facet joints and packed them with local autograft and morcellized allograft to perform arthrodesis from C3-C7. I then placed rods into the multiaxial screw heads of the screws and locked these into position with the locking caps and anti-torque device. I then checked the final construct with AP/Lat fluoroscopy. I irrigated with saline solution containing bacitracin. I placed a medium Hemovac drain through separate stab incision, and lined the dura with Gelfoam. After hemostasis was achieved I closed the muscle and the fascia with 0 Vicryl, subcutaneous tissue with 2-0 Vicryl, and the subcuticular tissue with 3-0 Vicryl. The skin was closed with benzoin and Steri-Strips. A sterile dressing was applied, the patient was turned to the supine position and taken out of the headrest, awakened from general anesthesia and transferred to the recovery room in stable condition. At the end of the procedure all sponge, needle and instrument counts were correct.    PLAN OF CARE: Admit for overnight observation  PATIENT DISPOSITION:  PACU - hemodynamically stable.   Delay start of Pharmacological VTE agent (>24hrs) due to surgical blood  loss or risk of bleeding:  yes

## 2018-01-03 NOTE — Anesthesia Postprocedure Evaluation (Signed)
Anesthesia Post Note  Patient: Matthew Tucker  Procedure(s) Performed: Posterior Cervical Fusion with lateral mass fixation - Cervical three - Cervical seven, cervical laminectomy Cervical three-cervical seven (N/A )     Patient location during evaluation: PACU Anesthesia Type: General Level of consciousness: awake and alert Pain management: pain level controlled Vital Signs Assessment: post-procedure vital signs reviewed and stable Respiratory status: spontaneous breathing, nonlabored ventilation and respiratory function stable Cardiovascular status: blood pressure returned to baseline and stable Postop Assessment: no apparent nausea or vomiting Anesthetic complications: no    Last Vitals:  Vitals:   01/03/18 1553 01/03/18 1600  BP: (!) 152/85   Pulse: 65 66  Resp: 20 14  Temp:    SpO2: 94% 98%    Last Pain:  Vitals:   01/03/18 1600  TempSrc:   PainSc: [place]

## 2018-01-03 NOTE — Anesthesia Procedure Notes (Signed)
Procedure Name: Intubation Date/Time: 01/03/2018 11:58 AM Performed by: Colin Benton, CRNA Pre-anesthesia Checklist: Patient identified, Emergency Drugs available, Suction available and Patient being monitored Patient Re-evaluated:Patient Re-evaluated prior to induction Oxygen Delivery Method: Circle system utilized Preoxygenation: Pre-oxygenation with 100% oxygen Induction Type: IV induction Ventilation: Mask ventilation without difficulty Laryngoscope Size: [name] and 4 Grade View: Grade III Tube type: Oral Tube size: 8.0 mm Number of attempts: 1 Airway Equipment and Method: Stylet Placement Confirmation: ETT inserted through vocal cords under direct vision,  positive ETCO2 and breath sounds checked- equal and bilateral Secured at: 25 cm Tube secured with: Tape Dental Injury: Teeth and Oropharynx as per pre-operative assessment

## 2018-01-03 NOTE — Progress Notes (Signed)
Patient ID: Matthew Tucker, male   DOB: December 16, 1950, 67 y.o.   MRN: [id number] Patient looked good postop. Had good strength and had well-controlled pain. Moved all extremities well. Seemed pleased.

## 2018-01-03 NOTE — Anesthesia Preprocedure Evaluation (Signed)
Anesthesia Evaluation  Patient identified by MRN, date of birth, ID band Patient awake    Reviewed: Allergy & Precautions, NPO status , Patient's Chart, lab work & pertinent test results  Airway Mallampati: II  TM Distance: >3 FB Neck ROM: Full    Dental no notable dental hx.    Pulmonary neg pulmonary ROS,    Pulmonary exam normal breath sounds clear to auscultation       Cardiovascular negative cardio ROS Normal cardiovascular exam Rhythm:Regular Rate:Normal     Neuro/Psych Anxiety negative neurological ROS  negative psych ROS   GI/Hepatic negative GI ROS, Neg liver ROS,   Endo/Other  negative endocrine ROS  Renal/GU negative Renal ROS  negative genitourinary   Musculoskeletal negative musculoskeletal ROS (+) Arthritis , Osteoarthritis,    Abdominal   Peds negative pediatric ROS (+)  Hematology negative hematology ROS (+)   Anesthesia Other Findings   Reproductive/Obstetrics negative OB ROS                             Anesthesia Physical Anesthesia Plan  ASA: II  Anesthesia Plan: General   Post-op Pain Management:    Induction: Intravenous  PONV Risk Score and Plan: 2 and Ondansetron and Midazolam  Airway Management Planned: Oral ETT  Additional Equipment:   Intra-op Plan:   Post-operative Plan: Extubation in OR  Informed Consent: I have reviewed the patients History and Physical, chart, labs and discussed the procedure including the risks, benefits and alternatives for the proposed anesthesia with the patient or authorized representative who has indicated his/her understanding and acceptance.   Dental advisory given  Plan Discussed with: CRNA  Anesthesia Plan Comments:         Anesthesia Quick Evaluation

## 2018-01-04 ENCOUNTER — Encounter (HOSPITAL_COMMUNITY): Payer: Self-pay | Admitting: Neurological Surgery

## 2018-01-04 MED FILL — Thrombin For Soln 5000 Unit: CUTANEOUS | Qty: 5000 | Status: AC

## 2018-01-04 MED FILL — Thrombin For Soln 20000 Unit: CUTANEOUS | Qty: 1 | Status: AC

## 2018-01-04 NOTE — Progress Notes (Signed)
Patient ID: Matthew Tucker, male   DOB: 1951/03/29, 67 y.o.   MRN: [id number] Subjective: Patient reports mild soreness in neck ,no arm pain or NTW  Objective: Vital signs in last 24 hours: Temp:  [97.2 F (36.2 C)-99.2 F (37.3 C)] 99.2 F (37.3 C) (04/26 0300) Pulse Rate:  [59-73] 72 (04/26 0300) Resp:  [8-20] 18 (04/26 0300) BP: (136-190)/(70-99) 136/80 (04/26 0300) SpO2:  [94 %-100 %] 96 % (04/26 0300) Weight:  [93.6 kg (206 lb 5.6 oz)-93.9 kg (207 lb)] 93.6 kg (206 lb 5.6 oz) (04/25 1905)  Intake/Output from previous day: 04/25 0701 - 04/26 0700 In: 1300 [I.V.:1200; IV Piggyback:100] Out: 50 [Blood:50] Intake/Output this shift: No intake/output data recorded.  Neurologic: Grossly normal  Lab Results: Lab Results  Component Value Date   WBC 6.0 12/26/2017   HGB 13.7 12/26/2017   HCT 41.1 12/26/2017   MCV 91.1 12/26/2017   PLT 166 12/26/2017   Lab Results  Component Value Date   INR 1.05 12/26/2017   BMET Lab Results  Component Value Date   NA 139 12/26/2017   K 3.6 12/26/2017   CL 106 12/26/2017   CO2 22 12/26/2017   GLUCOSE 101 (H) 12/26/2017   BUN 12 12/26/2017   CREATININE 1.32 (H) 12/26/2017   CALCIUM 9.0 12/26/2017    Studies/Results: Dg Cervical Spine 2 Or 3 Views  Result Date: 01/03/2018 CLINICAL DATA:  Posterior cervical fusion EXAM: CERVICAL SPINE - 2-3 VIEW COMPARISON:  None. FLUOROSCOPY TIME:  Radiation Exposure Index (as provided by the fluoroscopic device): Not available If the device does not provide the exposure index: Fluoroscopy Time:  32 seconds Number of Acquired Images:  3 FINDINGS: Posterior cervical fusion is noted from C3 to C7 with posterior fixation. Normal alignment is noted. Mild osteophytic changes are noted anteriorly. IMPRESSION: Wide posterior cervical fusion from C3-C7. Electronically Signed   By: Inez Catalina M.D.   On: 01/03/2018 15:34   Dg C-arm 1-60 Min  Result Date: 01/03/2018 CLINICAL DATA:  Posterior cervical fusion  EXAM: CERVICAL SPINE - 2-3 VIEW COMPARISON:  None. FLUOROSCOPY TIME:  Radiation Exposure Index (as provided by the fluoroscopic device): Not available If the device does not provide the exposure index: Fluoroscopy Time:  32 seconds Number of Acquired Images:  3 FINDINGS: Posterior cervical fusion is noted from C3 to C7 with posterior fixation. Normal alignment is noted. Mild osteophytic changes are noted anteriorly. IMPRESSION: Wide posterior cervical fusion from C3-C7. Electronically Signed   By: Inez Catalina M.D.   On: 01/03/2018 15:34    Assessment/Plan: Doing really well, mobilize today, home tomorrow  Estimated body mass index is 29.61 kg/m as calculated from the following:   Height as of this encounter: 5\' 10"  (1.778 m).   Weight as of this encounter: 93.6 kg (206 lb 5.6 oz).    LOS: 1 day    Eriverto Byrnes S 01/04/2018, 7:21 AM

## 2018-01-04 NOTE — Evaluation (Signed)
Physical Therapy Evaluation Patient Details Name: Matthew Tucker MRN: [id number] DOB: 1951-02-18 Today's Date: 01/04/2018   History of Present Illness  Pt admit for C3-C7 posterior cervical fusion.  Hx of anxiety and back pain.   Clinical Impression  Pt admitted with above diagnosis. Pt currently with functional limitations due to the deficits listed below (see PT Problem List). Pt was able to ambulate with RW with good stability.  Needed RW for stability as walking without device, pt was reaching for furniture in room.  Pt agrees he can get one if needed.  Pt limited by pain today.  Should progress well.  VSS with therapy today.  Feel that given that he has limitations and needs RW, will benefit from HHPT f/u.   Pt will benefit from skilled PT to increase their independence and safety with mobility to allow discharge to the venue listed below.      Follow Up Recommendations Home health PT;Supervision/Assistance - 24 hour    Equipment Recommendations  Rolling walker with 5" wheels;3in1 (PT)    Recommendations for Other Services       Precautions / Restrictions Precautions Precautions: Fall;Cervical;Back Precaution Booklet Issued: Yes (comment) Precaution Comments: Verbal instruction of cervical precautions and discussion regarding handout Restrictions Weight Bearing Restrictions: No      Mobility  Bed Mobility Overal bed mobility: Needs Assistance Bed Mobility: Rolling;Sidelying to Sit Rolling: Min guard Sidelying to sit: Min guard       General bed mobility comments: cues for technique  Transfers Overall transfer level: Needs assistance Equipment used: Rolling walker (2 wheeled);None Transfers: Sit to/from Stand Sit to Stand: Min guard;Supervision         General transfer comment: Pt needed steadying assist only to stand to RW.  Initially needing RW for support for safety.   Ambulation/Gait Ambulation/Gait assistance: Min guard;Min assist Ambulation Distance  (Feet): 110 Feet Assistive device: Rolling walker (2 wheeled);None Gait Pattern/deviations: Step-through pattern;Decreased stride length;Antalgic;Drifts right/left;Staggering right;Staggering left   Gait velocity interpretation: 1.31 - 2.62 ft/sec, indicative of limited community ambulator General Gait Details: Pt ambulating better with the RW.  Pt needed to use the RW for stability. When pt had no device did lose balance and need physical assist to recover balance.   Stairs Stairs: (declined practice today)          Wheelchair Mobility    Modified Rankin (Stroke Patients Only)       Balance Overall balance assessment: Needs assistance Sitting-balance support: No upper extremity supported;Feet supported Sitting balance-Leahy Scale: Good     Standing balance support: Bilateral upper extremity supported;During functional activity Standing balance-Leahy Scale: Poor Standing balance comment: relies on RW for support and stabiltiy.                              Pertinent Vitals/Pain Pain Assessment: Faces Faces Pain Scale: Hurts whole lot Pain Location: neck and back per pt Pain Descriptors / Indicators: Aching;Grimacing;Guarding Pain Intervention(s): Limited activity within patient's tolerance;Monitored during session;Premedicated before session;Repositioned    Home Living Family/patient expects to be discharged to:: Private residence Living Arrangements: Spouse/significant other Available Help at Discharge: Family;Available 24 hours/day(can have son take off per pt if needed) Type of Home: House Home Access: Stairs to enter   [employer] of Steps: 5 Home Layout: Two level;Able to live on main level with bedroom/bathroom Home Equipment: None      Prior Function Level of Independence: Independent with assistive device(s)  Comments: used cane at times per pt     Hand Dominance        Extremity/Trunk Assessment   Upper Extremity  Assessment Upper Extremity Assessment: Defer to OT evaluation    Lower Extremity Assessment Lower Extremity Assessment: RLE deficits/detail;LLE deficits/detail RLE Deficits / Details: grossly 3/5 LLE Deficits / Details: grossly 3/5    Cervical / Trunk Assessment Cervical / Trunk Assessment: Normal  Communication      Cognition Arousal/Alertness: Awake/alert Behavior During Therapy: WFL for tasks assessed/performed Overall Cognitive Status: Within Functional Limits for tasks assessed                                        General Comments General comments (skin integrity, edema, etc.): Drain in place cervical area    Exercises     Assessment/Plan    PT Assessment Patient needs continued PT services  PT Problem List Decreased activity tolerance;Decreased balance;Decreased mobility;Decreased knowledge of use of DME;Decreased safety awareness;Decreased knowledge of precautions;Pain       PT Treatment Interventions DME instruction;Gait training;Stair training;Functional mobility training;Therapeutic activities;Therapeutic exercise;Balance training;Patient/family education    PT Goals (Current goals can be found in the Care Plan section)  Acute Rehab PT Goals Patient Stated Goal: to go home PT Goal Formulation: With patient Time For Goal Achievement: 01/18/18 Potential to Achieve Goals: Good    Frequency Min 6X/week   Barriers to discharge        Co-evaluation               AM-PAC PT "6 Clicks" Daily Activity  Outcome Measure Difficulty turning over in bed (including adjusting bedclothes, sheets and blankets)?: None Difficulty moving from lying on back to sitting on the side of the bed? : None Difficulty sitting down on and standing up from a chair with arms (e.g., wheelchair, bedside commode, etc,.)?: A Little Help needed moving to and from a bed to chair (including a wheelchair)?: A Little Help needed walking in hospital room?: A Little Help  needed climbing 3-5 steps with a railing? : A Lot 6 Click Score: 19    End of Session Equipment Utilized During Treatment: Gait belt Activity Tolerance: Patient limited by fatigue;Patient limited by pain Patient left: in chair;with call bell/phone within reach;with chair alarm set Nurse Communication: Mobility status PT Visit Diagnosis: Muscle weakness (generalized) (M62.81);Pain Pain - part of body: (neck and back)    Time: 4270-6237 PT Time Calculation (min) (ACUTE ONLY): 30 min   Charges:   PT Evaluation $PT Eval Moderate Complexity: 1 Mod PT Treatments $Gait Training: 8-22 mins   PT G Codes:        Amarrah Meinhart,PT Acute Rehabilitation [phone] (pager)   Denice Paradise 01/04/2018, 10:33 AM

## 2018-01-04 NOTE — Care Management Note (Signed)
Case Management Note  Patient Details  Name: Matthew Tucker MRN: [id number] Date of Birth: [birth year]  Subjective/Objective:   Pt admit for C3-C7 posterior cervical fusion on 01/03/18.  PTA, pt independent with assistive devices; lives at home with spouse.                  Action/Plan: PT recommending HH follow up.  Will follow for orders for HH/DME.    Expected Discharge Date:                  Expected Discharge Plan:  [name]  In-House Referral:     Discharge planning Services  CM Consult  Post Acute Care Choice:    Choice offered to:     DME Arranged:    DME Agency:     HH Arranged:    [place] Agency:     Status of Service:  In process, will continue to follow  If discussed at Long Length of Stay Meetings, dates discussed:    Additional Comments:  Reinaldo Raddle, RN, BSN  Trauma/Neuro ICU Case Manager [phone]

## 2018-01-05 NOTE — Progress Notes (Signed)
Patient ID: Matthew Tucker, male   DOB: [birth year], 67 y.o.   MRN: [id number] Subjective: Patient reports a sharp pain in the left shoulder, no arm pain or numbness tingling or weakness.  Objective: Vital signs in last 24 hours: Temp:  [97.9 F (36.6 C)-99 F (37.2 C)] 98.4 F (36.9 C) (04/27 0758) Pulse Rate:  [53-66] 61 (04/27 0758) Resp:  [18-19] 19 (04/27 0351) BP: (132-146)/(80-98) 146/87 (04/27 0758) SpO2:  [96 %-99 %] 99 % (04/27 0758)  Intake/Output from previous day: 04/26 0701 - 04/27 0700 In: 960 [P.O.:960] Out: 1460 [Urine:1300; Drains:160] Intake/Output this shift: No intake/output data recorded.  Neurologic: Grossly normal, Manuela Schwartz clean dry and intact, awake and alert, moving all extremities well  Lab Results: Lab Results  Component Value Date   WBC 6.0 12/26/2017   HGB 13.7 12/26/2017   HCT 41.1 12/26/2017   MCV 91.1 12/26/2017   PLT 166 12/26/2017   Lab Results  Component Value Date   INR 1.05 12/26/2017   BMET Lab Results  Component Value Date   NA 139 12/26/2017   K 3.6 12/26/2017   CL 106 12/26/2017   CO2 22 12/26/2017   GLUCOSE 101 (H) 12/26/2017   BUN 12 12/26/2017   CREATININE 1.32 (H) 12/26/2017   CALCIUM 9.0 12/26/2017    Studies/Results: Dg Cervical Spine 2 Or 3 Views  Result Date: 01/03/2018 CLINICAL DATA:  Posterior cervical fusion EXAM: CERVICAL SPINE - 2-3 VIEW COMPARISON:  None. FLUOROSCOPY TIME:  Radiation Exposure Index (as provided by the fluoroscopic device): Not available If the device does not provide the exposure index: Fluoroscopy Time:  32 seconds Number of Acquired Images:  3 FINDINGS: Posterior cervical fusion is noted from C3 to C7 with posterior fixation. Normal alignment is noted. Mild osteophytic changes are noted anteriorly. IMPRESSION: Wide posterior cervical fusion from C3-C7. Electronically Signed   By: Inez Catalina M.D.   On: 01/03/2018 15:34   Dg C-arm 1-60 Min  Result Date: 01/03/2018 CLINICAL DATA:  Posterior  cervical fusion EXAM: CERVICAL SPINE - 2-3 VIEW COMPARISON:  None. FLUOROSCOPY TIME:  Radiation Exposure Index (as provided by the fluoroscopic device): Not available If the device does not provide the exposure index: Fluoroscopy Time:  32 seconds Number of Acquired Images:  3 FINDINGS: Posterior cervical fusion is noted from C3 to C7 with posterior fixation. Normal alignment is noted. Mild osteophytic changes are noted anteriorly. IMPRESSION: Wide posterior cervical fusion from C3-C7. Electronically Signed   By: Inez Catalina M.D.   On: 01/03/2018 15:34    Assessment/Plan: For all doing well. Neurological exam normal. Has appropriate postoperative soreness. We'll continue pain control and likely discharge tomorrow  Estimated body mass index is 29.61 kg/m as calculated from the following:   Height as of this encounter: 5\' 10"  (1.778 m).   Weight as of this encounter: 93.6 kg (206 lb 5.6 oz).    LOS: 2 days    Corby Vandenberghe S 01/05/2018, 9:48 AM

## 2018-01-05 NOTE — Progress Notes (Signed)
Physical Therapy Treatment Patient Details Name: Matthew Tucker MRN: [id number] DOB: 1951-08-27 Today's Date: 01/05/2018    History of Present Illness Pt admit for C3-C7 posterior cervical fusion.  Hx of anxiety and back pain.     PT Comments    Pt pleasant and reports no pain at rest in bed or in chair. However, with any movement he has a stabbing shooting pain in left neck/shoulder area by hemovac of 6in area causing 10/10 pain and limiting all mobility due to pain. Pt educated for sequence of transfers, gait and progression with pt agreeable to current need for RW with gait. Will continue to follow.     Follow Up Recommendations  Home health PT;Supervision/Assistance - 24 hour     Equipment Recommendations  Rolling walker with 5" wheels;3in1 (PT)    Recommendations for Other Services       Precautions / Restrictions Precautions Precautions: Fall;Cervical;Back Precaution Comments: Verbal instruction of cervical precautions  Restrictions Weight Bearing Restrictions: No    Mobility  Bed Mobility Overal bed mobility: Needs Assistance Bed Mobility: Rolling;Sidelying to Sit Rolling: Min guard Sidelying to sit: Min guard       General bed mobility comments: cues for technique, precautions and sequence. pt initiated with supine to long sitting and return to supine for proper sequence  Transfers Overall transfer level: Needs assistance   Transfers: Sit to/from Stand Sit to Stand: Min assist         General transfer comment: pt with min assist to stand and increased time as pt states neck/shoulder pain is intense with all mobility making transition to standing difficult  Ambulation/Gait Ambulation/Gait assistance: Min guard Ambulation Distance (Feet): 350 Feet Assistive device: Rolling walker (2 wheeled) Gait Pattern/deviations: Step-through pattern;Decreased stride length   Gait velocity interpretation: <1.8 ft/sec, indicate of risk for recurrent falls General  Gait Details: pt with slow cautious gait, reliance on RW and needing rW for stability and safety   Stairs Stairs: Yes Stairs assistance: Supervision Stair Management: Alternating pattern;Forwards;One rail Right Number of Stairs: 11 General stair comments: pt able to complete stairs with use of rail, supervision for safety   Wheelchair Mobility    Modified Rankin (Stroke Patients Only)       Balance Overall balance assessment: Needs assistance   Sitting balance-Leahy Scale: Good       Standing balance-Leahy Scale: Poor                              Cognition Arousal/Alertness: Awake/alert Behavior During Therapy: WFL for tasks assessed/performed Overall Cognitive Status: Within Functional Limits for tasks assessed                                        Exercises      General Comments        Pertinent Vitals/Pain Faces Pain Scale: Hurts whole lot Pain Location: left shoulder/neck Pain Descriptors / Indicators: Shooting;Stabbing Pain Intervention(s): Limited activity within patient's tolerance;Repositioned;Monitored during session;Patient requesting pain meds-RN notified    Home Living                      Prior Function            PT Goals (current goals can now be found in the care plan section) Progress towards PT goals: Progressing toward goals  Frequency    Min 5X/week      PT Plan Current plan remains appropriate;Frequency needs to be updated    Co-evaluation              AM-PAC PT "6 Clicks" Daily Activity  Outcome Measure  Difficulty turning over in bed (including adjusting bedclothes, sheets and blankets)?: A Little Difficulty moving from lying on back to sitting on the side of the bed? : A Lot Difficulty sitting down on and standing up from a chair with arms (e.g., wheelchair, bedside commode, etc,.)?: Unable Help needed moving to and from a bed to chair (including a wheelchair)?: A  Little Help needed walking in hospital room?: A Little Help needed climbing 3-5 steps with a railing? : A Little 6 Click Score: 15    End of Session Equipment Utilized During Treatment: Gait belt Activity Tolerance: Patient limited by pain Patient left: in chair;with call bell/phone within reach Nurse Communication: Mobility status;Precautions PT Visit Diagnosis: Muscle weakness (generalized) (M62.81);Other abnormalities of gait and mobility (R26.89)     Time: 1751-0258 PT Time Calculation (min) (ACUTE ONLY): 24 min  Charges:  $Gait Training: 8-22 mins $Therapeutic Activity: 8-22 mins                    G Codes:       Elwyn Reach, PT [phone]    [place] 01/05/2018, 10:27 AM

## 2018-01-06 MED ORDER — METHOCARBAMOL 500 MG PO TABS: 500.0000 mg | ORAL_TABLET | Freq: Four times a day (QID) | ORAL | 1 refills | Status: DC | PRN

## 2018-01-06 MED ORDER — OXYCODONE HCL 10 MG PO TABS: 10.0000 mg | ORAL_TABLET | ORAL | 0 refills | Status: DC | PRN

## 2018-01-06 NOTE — Progress Notes (Signed)
Physical Therapy Treatment Patient Details Name: Matthew Tucker MRN: [id number] DOB: December 02, 1950 Today's Date: 01/06/2018    History of Present Illness Pt admit for C3-C7 posterior cervical fusion.  Hx of anxiety and back pain.     PT Comments    Continuing work on functional mobility and activity tolerance;  Able to walk hallways with RW well; Questions solicited and answered; OK for dc home from PT standpoint    Follow Up Recommendations  Home health PT;Supervision/Assistance - 24 hour     Equipment Recommendations  Rolling walker with 5" wheels;3in1 (PT)    Recommendations for Other Services       Precautions / Restrictions Precautions Precautions: Fall;Cervical;Back Precaution Booklet Issued: Yes (comment) Precaution Comments: Verbal instruction of cervical precautions     Mobility  Bed Mobility                  Transfers Overall transfer level: Needs assistance Equipment used: Rolling walker (2 wheeled) Transfers: Sit to/from Stand Sit to Stand: Supervision         General transfer comment: Suprevision for safety  Ambulation/Gait Ambulation/Gait assistance: Min guard(with and without physical contact) Ambulation Distance (Feet): 400 Feet Assistive device: Rolling walker (2 wheeled) Gait Pattern/deviations: Step-through pattern;Decreased dorsiflexion - right     General Gait Details: Noted occasional decr clearance with swing RLE; able to correct with cues; Cues to self-monitor for activity tolerance   Stairs   Stairs assistance: Supervision Stair Management: Alternating pattern;Forwards;One rail Right Number of Stairs: 11 General stair comments: pt able to complete stairs with use of rail, supervision for safety; we took time to work through options for going up and down steps without rails as pt does not have rails in his home   Wheelchair Mobility    Modified Rankin (Stroke Patients Only)       Balance     Sitting balance-Leahy  Scale: Good       Standing balance-Leahy Scale: Poor                              Cognition Arousal/Alertness: Awake/alert Behavior During Therapy: WFL for tasks assessed/performed Overall Cognitive Status: Within Functional Limits for tasks assessed                                 General Comments: slightly slow to answer questions; likely related to pain meds      Exercises      General Comments General comments (skin integrity, edema, etc.): We discussed what to expect at home; Mr. Brissett is very motivated ot get better, and tells me he will follow an exercise program, he began describing the old exercise program Dr. Joya Salm gave him; I advised him to get out that exercise program to go over with HHPT, who can make any needed adjustments      Pertinent Vitals/Pain Pain Assessment: 0-10 Pain Score: 7  Pain Location: left shoulder/neck Pain Descriptors / Indicators: Shooting;Stabbing Pain Intervention(s): Monitored during session    Home Living                      Prior Function            PT Goals (current goals can now be found in the care plan section) Acute Rehab PT Goals Patient Stated Goal: to go home PT Goal Formulation: With patient Time For  Goal Achievement: 01/18/18 Potential to Achieve Goals: Good Progress towards PT goals: Progressing toward goals    Frequency    Min 5X/week      PT Plan Current plan remains appropriate;Frequency needs to be updated    Co-evaluation              AM-PAC PT "6 Clicks" Daily Activity  Outcome Measure  Difficulty turning over in bed (including adjusting bedclothes, sheets and blankets)?: A Little Difficulty moving from lying on back to sitting on the side of the bed? : A Little Difficulty sitting down on and standing up from a chair with arms (e.g., wheelchair, bedside commode, etc,.)?: A Little Help needed moving to and from a bed to chair (including a wheelchair)?: A  Little Help needed walking in hospital room?: A Little Help needed climbing 3-5 steps with a railing? : A Little 6 Click Score: 18    End of Session Equipment Utilized During Treatment: Gait belt Activity Tolerance: Patient tolerated treatment well Patient left: in chair;with call bell/phone within reach Nurse Communication: Mobility status;Precautions PT Visit Diagnosis: Muscle weakness (generalized) (M62.81);Other abnormalities of gait and mobility (R26.89) Pain - part of body: (neck and back)     Time: 1751-0258 PT Time Calculation (min) (ACUTE ONLY): 36 min  Charges:  $Gait Training: 23-37 mins                    G Codes:       Roney Marion, [place] Pager [phone] Office [phone]    [place] 01/06/2018, 1:38 PM

## 2018-01-06 NOTE — Discharge Summary (Signed)
Physician Discharge Summary  Patient ID: Matthew Tucker MRN: [id number] DOB/AGE: 05/30/1951 67 y.o.  Admit date: 01/03/2018 Discharge date: 01/06/2018  Admission Diagnoses: Cervical stenosis, cervical spondylosis  Discharge Diagnoses: The same Active Problems:   Cervical vertebral fusion   Discharged Condition: good  Hospital Course: Dr. Ronnald Ramp performed a C3-4, C4-5, C5-6 and C6-7 posterior instrumentation and fusion on the patient on 01/03/2018.  The patient's postoperative course was unremarkable.  On postoperative day #3 the patient requested discharge home.  The patient, and his wife, were given written and oral discharge instructions.  All their questions were answered.  Consults: Physical therapy Significant Diagnostic Studies: None Treatments: C3-4, C4-5, C5-6 and C6-7 posterior cervical decompression and fusion. Discharge Exam: Blood pressure (!) 147/90, pulse (!) 57, temperature 98.4 F (36.9 C), temperature source Oral, resp. rate 20, height 5\' 10"  (1.778 m), weight 93.6 kg (206 lb 5.6 oz), SpO2 97 %. The patient is alert and pleasant.  He is moving all 4 extremities well.  His wound is healing well.  Disposition: Home  Discharge Instructions    Call MD for:  difficulty breathing, headache or visual disturbances   Complete by:  As directed    Call MD for:  extreme fatigue   Complete by:  As directed    Call MD for:  hives   Complete by:  As directed    Call MD for:  persistant dizziness or light-headedness   Complete by:  As directed    Call MD for:  persistant nausea and vomiting   Complete by:  As directed    Call MD for:  redness, tenderness, or signs of infection (pain, swelling, redness, odor or green/yellow discharge around incision site)   Complete by:  As directed    Call MD for:  severe uncontrolled pain   Complete by:  As directed    Call MD for:  temperature >100.4   Complete by:  As directed    Diet - low sodium heart healthy   Complete by:  As  directed    Discharge instructions   Complete by:  As directed    Call [phone] for a followup appointment. Take a stool softener while you are using pain medications.   Driving Restrictions   Complete by:  As directed    Do not drive for 2 weeks.   Increase activity slowly   Complete by:  As directed    Lifting restrictions   Complete by:  As directed    Do not lift more than 5 pounds. No excessive bending or twisting.   May shower / Bathe   Complete by:  As directed    Remove the dressing for 3 days after surgery.  You may shower, but leave the incision alone.   No dressing needed   Complete by:  As directed      Allergies as of 01/06/2018      Reactions   Other Other (See Comments)   Nivaquine. Found in [place] and [place] for Malaria treatment   Levofloxacin Other (See Comments)   Irritates gums      Medication List    STOP taking these medications   aspirin EC 81 MG tablet   ibuprofen 400 MG tablet Commonly known as:  ADVIL,MOTRIN     TAKE these medications   methocarbamol 500 MG tablet Commonly known as:  ROBAXIN Take 1 tablet (500 mg total) by mouth every 6 (six) hours as needed for muscle spasms.   Oxycodone HCl 10 MG  Tabs Take 1 tablet (10 mg total) by mouth every 4 (four) hours as needed for severe pain ((score 7 to 10)).   potassium chloride SA 20 MEQ tablet Commonly known as:  K-DUR,KLOR-CON TAKE 1 TABLET BY MOUTH ONCE DAILY FOR 3 DAYS   rosuvastatin 20 MG tablet Commonly known as:  CRESTOR TAKE 1 TABLET(20 MG) BY MOUTH DAILY   sildenafil 100 MG tablet Commonly known as:  VIAGRA Take 0.5-1 tablets (50-100 mg total) by mouth daily as needed for erectile dysfunction.        Signed: Ophelia Charter 01/06/2018, 9:26 AM

## 2018-01-07 ENCOUNTER — Telehealth: Payer: Self-pay | Admitting: *Deleted

## 2018-01-07 NOTE — Telephone Encounter (Signed)
Pt was on TCM report admitted 01/03/18 for Cervical stenosis, cervical spondylosis. Dr. Ronnald Ramp performed a C3-4, C4-5, C5-6 and C6-7 posterior instrumentation and fusion on the patient. Was D/C 01/06/18, and will f/u w/neurosurgeon.Marland KitchenJohny Chess

## 2018-01-10 ENCOUNTER — Ambulatory Visit: Payer: Medicare Other | Admitting: Internal Medicine

## 2018-02-12 ENCOUNTER — Other Ambulatory Visit: Payer: Self-pay | Admitting: Internal Medicine

## 2018-02-12 DIAGNOSIS — M4802 Spinal stenosis, cervical region: Secondary | ICD-10-CM | POA: Diagnosis not present

## 2018-02-15 ENCOUNTER — Ambulatory Visit (INDEPENDENT_AMBULATORY_CARE_PROVIDER_SITE_OTHER): Payer: Medicare Other | Admitting: Internal Medicine

## 2018-02-15 ENCOUNTER — Other Ambulatory Visit (INDEPENDENT_AMBULATORY_CARE_PROVIDER_SITE_OTHER): Payer: Medicare Other

## 2018-02-15 ENCOUNTER — Encounter: Payer: Self-pay | Admitting: Internal Medicine

## 2018-02-15 VITALS — BP 130/8 | HR 64 | Ht 70.0 in | Wt 206.0 lb

## 2018-02-15 DIAGNOSIS — E785 Hyperlipidemia, unspecified: Secondary | ICD-10-CM

## 2018-02-15 DIAGNOSIS — R7302 Impaired glucose tolerance (oral): Secondary | ICD-10-CM

## 2018-02-15 DIAGNOSIS — F411 Generalized anxiety disorder: Secondary | ICD-10-CM | POA: Diagnosis not present

## 2018-02-15 DIAGNOSIS — N320 Bladder-neck obstruction: Secondary | ICD-10-CM | POA: Diagnosis not present

## 2018-02-15 LAB — CBC WITH DIFFERENTIAL/PLATELET
Basophils Absolute: 0.1 10*3/uL (ref 0.0–0.1)
Basophils Relative: 1.3 % (ref 0.0–3.0)
Eosinophils Absolute: 0.6 10*3/uL (ref 0.0–0.7)
Eosinophils Relative: 8.7 % — ABNORMAL HIGH (ref 0.0–5.0)
HEMATOCRIT: 40.3 % (ref 39.0–52.0)
Hemoglobin: 13.5 g/dL (ref 13.0–17.0)
LYMPHS PCT: 43.6 % (ref 12.0–46.0)
Lymphs Abs: 2.9 10*3/uL (ref 0.7–4.0)
MCHC: 33.5 g/dL (ref 30.0–36.0)
MCV: 90.6 fl (ref 78.0–100.0)
MONOS PCT: 9.7 % (ref 3.0–12.0)
Monocytes Absolute: 0.7 10*3/uL (ref 0.1–1.0)
NEUTROS ABS: 2.5 10*3/uL (ref 1.4–7.7)
Neutrophils Relative %: 36.7 % — ABNORMAL LOW (ref 43.0–77.0)
PLATELETS: 187 10*3/uL (ref 150.0–400.0)
RBC: 4.44 Mil/uL (ref 4.22–5.81)
RDW: 13.4 % (ref 11.5–15.5)
WBC: 6.7 10*3/uL (ref 4.0–10.5)

## 2018-02-15 LAB — BASIC METABOLIC PANEL
BUN: 13 mg/dL (ref 6–23)
CHLORIDE: 103 meq/L (ref 96–112)
CO2: 27 mEq/L (ref 19–32)
CREATININE: 1.15 mg/dL (ref 0.40–1.50)
Calcium: 9.4 mg/dL (ref 8.4–10.5)
GFR: 81.11 mL/min (ref >60.00)
Glucose, Bld: 95 mg/dL (ref 70–99)
POTASSIUM: 3.7 meq/L (ref 3.5–5.1)
Sodium: 141 mEq/L (ref 135–145)

## 2018-02-15 LAB — LIPID PANEL
CHOLESTEROL: 189 mg/dL (ref 0–200)
HDL: 60.9 mg/dL (ref >39.00)
LDL CALC: 114 mg/dL — AB (ref 0–99)
NonHDL: 128.08
TRIGLYCERIDES: 69 mg/dL (ref 0.0–149.0)
Total CHOL/HDL Ratio: 3
VLDL: 13.8 mg/dL (ref 0.0–40.0)

## 2018-02-15 LAB — URINALYSIS, ROUTINE W REFLEX MICROSCOPIC
Bilirubin Urine: NEGATIVE
Ketones, ur: NEGATIVE
Leukocytes, UA: NEGATIVE
Nitrite: NEGATIVE
PH: 6.5 (ref 5.0–8.0)
Specific Gravity, Urine: 1.015 (ref 1.000–1.030)
TOTAL PROTEIN, URINE-UPE24: NEGATIVE
Urine Glucose: NEGATIVE
Urobilinogen, UA: 0.2 (ref 0.0–1.0)

## 2018-02-15 LAB — HEPATIC FUNCTION PANEL
ALBUMIN: 4.2 g/dL (ref 3.5–5.2)
ALK PHOS: 86 U/L (ref 39–117)
ALT: 15 U/L (ref 0–53)
AST: 18 U/L (ref 0–37)
Bilirubin, Direct: 0.2 mg/dL (ref 0.0–0.3)
TOTAL PROTEIN: 6.9 g/dL (ref 6.0–8.3)
Total Bilirubin: 1 mg/dL (ref 0.2–1.2)

## 2018-02-15 LAB — PSA: PSA: 2.1 ng/mL (ref 0.10–4.00)

## 2018-02-15 LAB — TSH: TSH: 2.88 u[IU]/mL (ref 0.35–4.50)

## 2018-02-15 LAB — HEMOGLOBIN A1C: Hgb A1c MFr Bld: 5.4 % (ref 4.6–6.5)

## 2018-02-15 MED ORDER — HYDROCODONE-ACETAMINOPHEN 10-325 MG PO TABS: 1.0000 | ORAL_TABLET | Freq: Four times a day (QID) | ORAL | 0 refills | Status: DC | PRN

## 2018-02-15 MED ORDER — ROSUVASTATIN CALCIUM 20 MG PO TABS: ORAL_TABLET | ORAL | 3 refills | Status: DC

## 2018-02-15 NOTE — Progress Notes (Signed)
Subjective:    Patient ID: Matthew Tucker, male    DOB: 12-24-1950, 67 y.o.   MRN: [id number]  HPI   Here for yearly f/u;  Overall doing ok;  Pt denies Chest pain, worsening SOB, DOE, wheezing, orthopnea, PND, worsening LE edema, palpitations, dizziness or syncope.  Pt denies neurological change such as new headache, facial or extremity weakness.  Pt denies polydipsia, polyuria, or low sugar symptoms. Pt states overall good compliance with treatment and medications, good tolerability, and has been trying to follow appropriate diet.  Pt denies worsening depressive symptoms, suicidal ideation or panic. No fever, night sweats, wt loss, loss of appetite, or other constitutional symptoms.  Pt states good ability with ADL's, has low fall risk, home safety reviewed and adequate, no other significant changes in hearing or vision, and only occasionally active with exercise.  Did f/u with Dr Ronnald Ramp, with neck xray stable, considering lumbar surgury if not improved later this year.  Pt continues to have recurring LBP without change in severity, bowel or bladder change, fever, wt loss,  worsening LE pain/numbness/weakness, gait change or falls.  No other new complaints or interval hx Past Medical History:  Diagnosis Date  . Anxiety state, unspecified 09/02/2007  . Arthritis   . BACK PAIN, CHRONIC 10/31/2010  . Cervical stenosis of spine   . Chronic lumbar radiculopathy 05/20/2015  . Family history of colon cancer 19-Jan-2013   Father died at 75yo  . HIP PAIN, RIGHT 12/02/2007  . HYPERLIPIDEMIA 09/02/2007  . Impaired glucose tolerance 08/29/2011  . Pain in joint, lower leg 12/02/2007  . Pain in Soft Tissues of Limb 12/02/2007  . PARESTHESIA 09/02/2007  . Toxic effect of chlorine gas(987.6) 10/31/2010  . WEAKNESS, RIGHT SIDE OF BODY 02/28/2010   Past Surgical History:  Procedure Laterality Date  . BUNIONECTOMY    . FOOT SURGERY Right   . mass removal     back, forehead; benign (lipoma)   . mass removal  2011     head; benign  . POSTERIOR CERVICAL FUSION/FORAMINOTOMY N/A 01/03/2018   Procedure: Posterior Cervical Fusion with lateral mass fixation - Cervical three - Cervical seven, cervical laminectomy Cervical three-cervical seven;  Surgeon: Eustace Moore, MD;  Location: [facility];  Service: Neurosurgery;  Laterality: N/A;  . s/p lipoma right scalp posteriorly  2011    reports that he has never smoked. He has never used smokeless tobacco. He reports that he drinks alcohol. He reports that he does not use drugs. family history includes Cancer in his mother; Colon cancer (age of onset: 96) in his father; Stroke (age of onset: 86) in his brother. Allergies  Allergen Reactions  . Other Other (See Comments)    Nivaquine. Found in [place] and [place] for Malaria treatment  . Levofloxacin Other (See Comments)    Irritates gums   Current Outpatient Medications on File Prior to Visit  Medication Sig Dispense Refill  . methocarbamol (ROBAXIN) 500 MG tablet Take 1 tablet (500 mg total) by mouth every 6 (six) hours as needed for muscle spasms. 50 tablet 1  . methylPREDNISolone (MEDROL DOSEPAK) 4 MG TBPK tablet     . sildenafil (VIAGRA) 100 MG tablet Take 0.5-1 tablets (50-100 mg total) by mouth daily as needed for erectile dysfunction. 10 tablet 11   No current facility-administered medications on file prior to visit.    Review of Systems Constitutional: Negative for other unusual diaphoresis, sweats, appetite or weight changes HENT: Negative for other worsening hearing loss, ear pain,  facial swelling, mouth sores or neck stiffness.   Eyes: Negative for other worsening pain, redness or other visual disturbance.  Respiratory: Negative for other stridor or swelling Cardiovascular: Negative for other palpitations or other chest pain  Gastrointestinal: Negative for worsening diarrhea or loose stools, blood in stool, distention or other pain Genitourinary: Negative for hematuria, flank pain or other change in urine  volume.  Musculoskeletal: Negative for myalgias or other joint swelling.  Skin: Negative for other color change, or other wound or worsening drainage.  Neurological: Negative for other syncope or numbness. Hematological: Negative for other adenopathy or swelling Psychiatric/Behavioral: Negative for hallucinations, other worsening agitation, SI, self-injury, or new decreased concentration All other system neg per pt    Objective:   Physical Exam BP (!) 130/8 (BP Location: Left Arm, Patient Position: Sitting, Cuff Size: Normal)   Pulse 64   Ht 5\' 10"  (1.778 m)   Wt 206 lb (93.4 kg)   SpO2 97%   BMI 29.56 kg/m  VS noted,  Constitutional: Pt is oriented to person, place, and time. Appears well-developed and well-nourished, in no significant distress and comfortable Head: Normocephalic and atraumatic  Eyes: Conjunctivae and EOM are normal. Pupils are equal, round, and reactive to light Right Ear: External ear normal without discharge Left Ear: External ear normal without discharge Nose: Nose without discharge or deformity Mouth/Throat: Oropharynx is without other ulcerations and moist  Neck: Normal range of motion. Neck supple. No JVD present. No tracheal deviation present or significant neck LA or mass Cardiovascular: Normal rate, regular rhythm, normal heart sounds and intact distal pulses.   Pulmonary/Chest: WOB normal and breath sounds without rales or wheezing  Abdominal: Soft. Bowel sounds are normal. NT. No HSM  Musculoskeletal: Normal range of motion. Exhibits no edema Lymphadenopathy: Has no other cervical adenopathy.  Neurological: Pt is alert and oriented to person, place, and time. Pt has normal reflexes. No cranial nerve deficit. Motor grossly intact, Gait intact Skin: Skin is warm and dry. No rash noted or new ulcerations Psychiatric:  Has normal mood and affect. Behavior is normal without agitation, mild nervous No other exam findings    Assessment & Plan:

## 2018-02-15 NOTE — Patient Instructions (Addendum)

## 2018-02-17 NOTE — Assessment & Plan Note (Signed)
stable overall by history and exam, recent data reviewed with pt, and pt to continue medical treatment as before,  to f/u any worsening symptoms or concerns, for f/u lab today 

## 2018-02-17 NOTE — Assessment & Plan Note (Signed)
stable overall by history and exam, recent data reviewed with pt, and pt to continue medical treatment as before,  to f/u any worsening symptoms or concerns, for f/u labs today 

## 2018-02-17 NOTE — Assessment & Plan Note (Signed)
stable overall by history and exam, and pt to continue medical treatment as before,  to f/u any worsening symptoms or concerns 

## 2018-04-15 ENCOUNTER — Other Ambulatory Visit: Payer: Self-pay | Admitting: Neurological Surgery

## 2018-04-15 DIAGNOSIS — M4802 Spinal stenosis, cervical region: Secondary | ICD-10-CM | POA: Diagnosis not present

## 2018-04-15 DIAGNOSIS — I10 Essential (primary) hypertension: Secondary | ICD-10-CM | POA: Diagnosis not present

## 2018-04-15 DIAGNOSIS — Z6829 Body mass index (BMI) 29.0-29.9, adult: Secondary | ICD-10-CM | POA: Diagnosis not present

## 2018-04-15 DIAGNOSIS — M48062 Spinal stenosis, lumbar region with neurogenic claudication: Secondary | ICD-10-CM | POA: Diagnosis not present

## 2018-05-16 ENCOUNTER — Ambulatory Visit: Payer: Self-pay | Admitting: Internal Medicine

## 2018-05-16 NOTE — Telephone Encounter (Signed)
FYI

## 2018-05-16 NOTE — Telephone Encounter (Signed)
Contacted pt regarding symptoms; pt says he is having swelling of both feet and ankles for about a week; he says ice and elevations helps but the swelling comes back; he also says that the numbness he is having is neurologic; he is scheduled for laminectomy on 06/19/18; the pt reports having a similar episode in Jan 2017 but resolved with pt seeing MD; [place] was [place] tri[place] to include seeing a physician within 3 days; pt offered and accepted appointment with Dr Cathlean Cower, [place], 05/17/18 at 1100; he verbalizes understanding; will route to office for notification of this upcoming appointment.   Reason for Disposition . [1] MILD swelling of both ankles (i.e., pedal edema) AND [2] new onset or worsening  Answer Assessment - Initial Assessment Questions 1. LOCATION: "Which joint is swollen?"     Bilateral foot and ankle sweling 2. ONSET: "When did the swelling start?"     1 week ago 3. SIZE: "How large is the swelling?"     Pitting edema 4. PAIN: "Is there any pain?" If so, ask: "How bad is it?" (Scale 1-10; or mild, moderate, severe)     no 5. CAUSE: "What do you think caused the swollen joint?"     Had similar episode January 2017 but resolved without seeing a MD 6. OTHER SYMPTOMS: "Do you have any other symptoms?" (e.g., fever, chest pain, difficulty breathing, calf pain)     no 7. PREGNANCY: "Is there any chance you are pregnant?" "When was your last menstrual period?"     n/a  Answer Assessment - Initial Assessment Questions 1. ONSET: "When did the swelling start?" (e.g., minutes, hours, days)     1 week ago 2. LOCATION: "What part of the leg is swollen?"  "Are both legs swollen or just one leg?"     Bilateral feet and ankles 3. SEVERITY: "How bad is the swelling?" (e.g., localized; mild, moderate, severe)  - Localized - small area of swelling localized to one leg  - MILD pedal edema - swelling limited to foot  and ankle, pitting edema < 1/4 inch (6 mm) deep, rest and elevation eliminate most or all swelling  - MODERATE edema - swelling of lower leg to knee, pitting edema > 1/4 inch (6 mm) deep, rest and elevation only partially reduce swelling  - SEVERE edema - swelling extends above knee, facial or hand swelling present      moderate 4. REDNESS: "Does the swelling look red or infected?"     no 5. PAIN: "Is the swelling painful to touch?" If so, ask: "How painful is it?"   (Scale 1-10; mild, moderate or severe)     no 6. FEVER: "Do you have a fever?" If so, ask: "What is it, how was it measured, and when did it start?"      no 7. CAUSE: "What do you think is causing the leg swelling?"     Unsure;   8. MEDICAL HISTORY: "Do you have a history of heart failure, kidney disease, liver failure, or cancer?"  no 9. RECURRENT SYMPTOM: "Have you had leg swelling before?" If so, ask: "When was the last time?" "What happened that time?" Had similar episode in Jan 2017 but resolved with pt seeing MD; pt thought it was due to the long flight from [place] 10. OTHER SYMPTOMS: "Do you have any other symptoms?" (e.g., chest pain, difficulty breathing)       no 11. PREGNANCY: "  Is there any chance you are pregnant?" "When was your last menstrual period?"       n/a  Protocols used: LEG SWELLING AND EDEMA-A-AH, ANKLE SWELLING-A-AH

## 2018-05-17 ENCOUNTER — Other Ambulatory Visit (INDEPENDENT_AMBULATORY_CARE_PROVIDER_SITE_OTHER): Payer: Medicare Other

## 2018-05-17 ENCOUNTER — Ambulatory Visit (INDEPENDENT_AMBULATORY_CARE_PROVIDER_SITE_OTHER)
Admission: RE | Admit: 2018-05-17 | Discharge: 2018-05-17 | Disposition: A | Discharge status: Home / Self Care | Payer: Medicare Other | Source: Ambulatory Visit | Attending: Internal Medicine | Admitting: Internal Medicine

## 2018-05-17 ENCOUNTER — Encounter: Payer: Self-pay | Admitting: Internal Medicine

## 2018-05-17 ENCOUNTER — Ambulatory Visit (INDEPENDENT_AMBULATORY_CARE_PROVIDER_SITE_OTHER): Payer: Medicare Other | Admitting: Internal Medicine

## 2018-05-17 VITALS — BP 122/84 | HR 67 | Temp 97.6°F | Ht 70.0 in | Wt 215.0 lb

## 2018-05-17 DIAGNOSIS — R7302 Impaired glucose tolerance (oral): Secondary | ICD-10-CM | POA: Diagnosis not present

## 2018-05-17 DIAGNOSIS — R972 Elevated prostate specific antigen [PSA]: Secondary | ICD-10-CM | POA: Diagnosis not present

## 2018-05-17 DIAGNOSIS — R609 Edema, unspecified: Secondary | ICD-10-CM

## 2018-05-17 DIAGNOSIS — Z23 Encounter for immunization: Secondary | ICD-10-CM | POA: Diagnosis not present

## 2018-05-17 DIAGNOSIS — R0600 Dyspnea, unspecified: Secondary | ICD-10-CM | POA: Diagnosis not present

## 2018-05-17 DIAGNOSIS — R0609 Other forms of dyspnea: Secondary | ICD-10-CM | POA: Diagnosis not present

## 2018-05-17 LAB — URINALYSIS, ROUTINE W REFLEX MICROSCOPIC
Bilirubin Urine: NEGATIVE
Ketones, ur: NEGATIVE
LEUKOCYTES UA: NEGATIVE
Nitrite: NEGATIVE
Specific Gravity, Urine: 1.02 (ref 1.000–1.030)
TOTAL PROTEIN, URINE-UPE24: NEGATIVE
Urine Glucose: NEGATIVE
Urobilinogen, UA: 0.2 (ref 0.0–1.0)
pH: 6 (ref 5.0–8.0)

## 2018-05-17 LAB — BASIC METABOLIC PANEL
BUN: 21 mg/dL (ref 6–23)
CALCIUM: 9 mg/dL (ref 8.4–10.5)
CO2: 31 mEq/L (ref 19–32)
CREATININE: 1.31 mg/dL (ref 0.40–1.50)
Chloride: 106 mEq/L (ref 96–112)
GFR: 69.74 mL/min (ref >60.00)
Glucose, Bld: 95 mg/dL (ref 70–99)
Potassium: 3.6 mEq/L (ref 3.5–5.1)
Sodium: 143 mEq/L (ref 135–145)

## 2018-05-17 LAB — CBC WITH DIFFERENTIAL/PLATELET
BASOS ABS: 0.1 10*3/uL (ref 0.0–0.1)
Basophils Relative: 1.1 % (ref 0.0–3.0)
Eosinophils Absolute: 0.4 10*3/uL (ref 0.0–0.7)
Eosinophils Relative: 6.1 % — ABNORMAL HIGH (ref 0.0–5.0)
HEMATOCRIT: 42.6 % (ref 39.0–52.0)
HEMOGLOBIN: 14.3 g/dL (ref 13.0–17.0)
LYMPHS PCT: 44.7 % (ref 12.0–46.0)
Lymphs Abs: 2.7 10*3/uL (ref 0.7–4.0)
MCHC: 33.6 g/dL (ref 30.0–36.0)
MCV: 90.5 fl (ref 78.0–100.0)
MONOS PCT: 10.4 % (ref 3.0–12.0)
Monocytes Absolute: 0.6 10*3/uL (ref 0.1–1.0)
Neutro Abs: 2.3 10*3/uL (ref 1.4–7.7)
Neutrophils Relative %: 37.7 % — ABNORMAL LOW (ref 43.0–77.0)
Platelets: 168 10*3/uL (ref 150.0–400.0)
RBC: 4.71 Mil/uL (ref 4.22–5.81)
RDW: 13.1 % (ref 11.5–15.5)
WBC: 6 10*3/uL (ref 4.0–10.5)

## 2018-05-17 LAB — HEPATIC FUNCTION PANEL
ALBUMIN: 4.1 g/dL (ref 3.5–5.2)
ALK PHOS: 83 U/L (ref 39–117)
ALT: 17 U/L (ref 0–53)
AST: 21 U/L (ref 0–37)
Bilirubin, Direct: 0.2 mg/dL (ref 0.0–0.3)
TOTAL PROTEIN: 6.8 g/dL (ref 6.0–8.3)
Total Bilirubin: 1 mg/dL (ref 0.2–1.2)

## 2018-05-17 LAB — TSH: TSH: 3.2 u[IU]/mL (ref 0.35–4.50)

## 2018-05-17 LAB — PSA: PSA: 1.51 ng/mL (ref 0.10–4.00)

## 2018-05-17 MED ORDER — FUROSEMIDE 20 MG PO TABS: 20.0000 mg | ORAL_TABLET | Freq: Every day | ORAL | 3 refills | Status: DC | PRN

## 2018-05-17 NOTE — Progress Notes (Signed)
Subjective:    Patient ID: Matthew Tucker, male    DOB: [birth year], 67 y.o.   MRN: [id number]  HPI  Here with 2 wks onset bilat ankle edema that is noticeable and actually slightly improved today with elevation yesterday, but still present; no pain, trauma, fever, but has hx of what sounds like venous insuffiency which occurred with a long plane flight a few yrs ago.  Pt denies chest pain, wheezing, orthopnea, PND, palpitations, dizziness or syncope, but has also gained wt and mentions mild unusual DOE with light yardwork.  Pt denies new neurological symptoms such as new headache, or facial or extremity weakness or numbness   Pt denies polydipsia, polyuria,  No prior hx of CHF, pulm HTN, cirrhosis or renal failure  Had labs June 2019 essentially stable Wt Readings from Last 3 Encounters:  05/17/18 215 lb (97.5 kg)  02/15/18 206 lb (93.4 kg)  01/03/18 206 lb 5.6 oz (93.6 kg)  S/p c spine surgury earlier this yr, now for oct 2019 lumbar surgury. Past Medical History:  Diagnosis Date  . Anxiety state, unspecified 09/02/2007  . Arthritis   . BACK PAIN, CHRONIC 10/31/2010  . Cervical stenosis of spine   . Chronic lumbar radiculopathy 05/20/2015  . Family history of colon cancer 02/03/2013   Father died at 67yo  . HIP PAIN, RIGHT 12/02/2007  . HYPERLIPIDEMIA 09/02/2007  . Impaired glucose tolerance 08/29/2011  . Pain in joint, lower leg 12/02/2007  . Pain in Soft Tissues of Limb 12/02/2007  . PARESTHESIA 09/02/2007  . Toxic effect of chlorine gas(987.6) 10/31/2010  . WEAKNESS, RIGHT SIDE OF BODY 02/28/2010   Past Surgical History:  Procedure Laterality Date  . BUNIONECTOMY    . FOOT SURGERY Right   . mass removal     back, forehead; benign (lipoma)   . mass removal  2011   head; benign  . POSTERIOR CERVICAL FUSION/FORAMINOTOMY N/A 01/03/2018   Procedure: Posterior Cervical Fusion with lateral mass fixation - Cervical three - Cervical seven, cervical laminectomy Cervical three-cervical seven;   Surgeon: Eustace Moore, MD;  Location: [place] [facility];  Service: Neurosurgery;  Laterality: N/A;  . s/p lipoma right scalp posteriorly  2011    reports that he has never smoked. He has never used smokeless tobacco. He reports that he drinks alcohol. He reports that he does not use drugs. family history includes Cancer in his mother; Colon cancer (age of onset: 82) in his father; Stroke (age of onset: 45) in his brother. Allergies  Allergen Reactions  . Other Other (See Comments)    Nivaquine. Found in [place] and [place] for Malaria treatment  . Levofloxacin Other (See Comments)    Irritates gums   Current Outpatient Medications on File Prior to Visit  Medication Sig Dispense Refill  . HYDROcodone-acetaminophen (NORCO) 10-325 MG tablet Take 1 tablet by mouth every 6 (six) hours as needed for severe pain. 30 tablet 0  . methocarbamol (ROBAXIN) 500 MG tablet Take 1 tablet (500 mg total) by mouth every 6 (six) hours as needed for muscle spasms. 50 tablet 1  . methylPREDNISolone (MEDROL DOSEPAK) 4 MG TBPK tablet     . rosuvastatin (CRESTOR) 20 MG tablet TAKE 1 TABLET(20 MG) BY MOUTH DAILY 90 tablet 3  . sildenafil (VIAGRA) 100 MG tablet Take 0.5-1 tablets (50-100 mg total) by mouth daily as needed for erectile dysfunction. 10 tablet 11   No current facility-administered medications on file prior to visit.    Review of Systems  Constitutional: Negative for other unusual diaphoresis or sweats HENT: Negative for ear discharge or swelling Eyes: Negative for other worsening visual disturbances Respiratory: Negative for stridor or other swelling  Gastrointestinal: Negative for worsening distension or other blood Genitourinary: Negative for retention or other urinary change Musculoskeletal: Negative for other MSK pain or swelling Skin: Negative for color change or other new lesions Neurological: Negative for worsening tremors and other numbness  Psychiatric/Behavioral: Negative for worsening agitation or  other fatigue All other system neg per pt    Objective:   Physical Exam BP 122/84   Pulse 67   Temp 97.6 F (36.4 C) (Oral)   Ht 5\' 10"  (1.778 m)   Wt 215 lb (97.5 kg)   SpO2 94%   BMI 30.85 kg/m  VS noted,  Constitutional: Pt appears in NAD HENT: Head: NCAT.  Right Ear: External ear normal.  Left Ear: External ear normal.  Eyes: . Pupils are equal, round, and reactive to light. Conjunctivae and EOM are normal Nose: without d/c or deformity Neck: Neck supple. Gross normal ROM Cardiovascular: Normal rate and regular rhythm.   Pulmonary/Chest: Effort normal and breath sounds without rales or wheezing.  Abd:  Soft, NT, ND, + BS, no organomegaly Neurological: Pt is alert. At baseline orientation, motor grossly intact Skin: Skin is warm. No rashes, other new lesions, trace to 1+ bilat LE edema to just able ankles Psychiatric: Pt behavior is normal without agitation  No other exam findings  Lab Results  Component Value Date   WBC 6.7 02/15/2018   HGB 13.5 02/15/2018   HCT 40.3 02/15/2018   PLT 187.0 02/15/2018   GLUCOSE 95 02/15/2018   CHOL 189 02/15/2018   TRIG 69.0 02/15/2018   HDL 60.90 02/15/2018   LDLDIRECT 147.4 01/10/2013   LDLCALC 114 (H) 02/15/2018   ALT 15 02/15/2018   AST 18 02/15/2018   NA 141 02/15/2018   K 3.7 02/15/2018   CL 103 02/15/2018   CREATININE 1.15 02/15/2018   BUN 13 02/15/2018   CO2 27 02/15/2018   TSH 2.88 02/15/2018   PSA 2.10 02/15/2018   INR 1.05 12/26/2017   HGBA1C 5.4 02/15/2018   ECG today I have personally interpreted: NSR with non specific T wave changes    Assessment & Plan:

## 2018-05-17 NOTE — Patient Instructions (Signed)
Your EKG was OK today  Please take all new medication as prescribed - the lasix (fluid pill) at low dose once per day as needed for persistent leg swelling  Please continue all other medications as before, and refills have been done if requested.  Please have the pharmacy call with any other refills you may need.  Please continue your efforts at being more active, low cholesterol diet, and weight control.  Please keep your appointments with your specialists as you may have planned  You will be contacted regarding the referral for: Echocardiogram  Please go to the XRAY Department in the Basement (go straight as you get off the elevator) for the x-ray testing  Please go to the LAB in the Basement (turn left off the elevator) for the tests to be done today  You will be contacted by phone if any changes need to be made immediately.  Otherwise, you will receive a letter about your results with an explanation, but please check with MyChart first.

## 2018-05-17 NOTE — Assessment & Plan Note (Signed)
Asympt, also for f/u PSA today per pt request

## 2018-05-17 NOTE — Assessment & Plan Note (Addendum)
Etiology unclear, suspect primarily venous insufficiency but cant r/o other such as CHF; ecg reviewed and no change from previous; no CP but has had mild dyspnea as well; will check echo, labs as ordered, and tx with lasix 20 qd prn,  to f/u any worsening symptoms or concerns

## 2018-05-17 NOTE — Assessment & Plan Note (Signed)
stable overall by history and exam, recent data reviewed with pt, and pt to continue medical treatment as before,  to f/u any worsening symptoms or concerns Lab Results  Component Value Date   HGBA1C 5.4 02/15/2018

## 2018-05-20 ENCOUNTER — Ambulatory Visit (HOSPITAL_COMMUNITY): Payer: Medicare Other | Attending: Cardiology

## 2018-05-20 ENCOUNTER — Other Ambulatory Visit: Payer: Self-pay

## 2018-05-20 DIAGNOSIS — R0600 Dyspnea, unspecified: Secondary | ICD-10-CM

## 2018-05-20 DIAGNOSIS — R9431 Abnormal electrocardiogram [ECG] [EKG]: Secondary | ICD-10-CM | POA: Diagnosis not present

## 2018-05-20 DIAGNOSIS — I77810 Thoracic aortic ectasia: Secondary | ICD-10-CM | POA: Insufficient documentation

## 2018-05-20 DIAGNOSIS — R609 Edema, unspecified: Secondary | ICD-10-CM

## 2018-05-20 DIAGNOSIS — I517 Cardiomegaly: Secondary | ICD-10-CM | POA: Insufficient documentation

## 2018-05-20 DIAGNOSIS — R55 Syncope and collapse: Secondary | ICD-10-CM | POA: Diagnosis not present

## 2018-05-20 DIAGNOSIS — E785 Hyperlipidemia, unspecified: Secondary | ICD-10-CM | POA: Insufficient documentation

## 2018-05-21 ENCOUNTER — Encounter: Payer: Self-pay | Admitting: Internal Medicine

## 2018-05-21 DIAGNOSIS — I5189 Other ill-defined heart diseases: Secondary | ICD-10-CM | POA: Insufficient documentation

## 2018-05-21 HISTORY — DX: Other ill-defined heart diseases: I51.89

## 2018-06-07 NOTE — Pre-Procedure Instructions (Signed)
Matthew Tucker  06/07/2018      [facility] [place], [address] AT [place] [place] [address] Phone: [phone] Fax: [phone]    Your procedure is scheduled on Thursday October 10th.  Report to [facility] Admitting at 0900 A.M.  Call this number if you have problems the morning of surgery:  [phone]   Remember:  Do not eat or drink after midnight.    Take these medicines the morning of surgery with A SIP OF WATER   Hydrocodone (if needed)  Methocarbamol (if needed)  Crestor  7 days prior to surgery STOP taking any Aspirin(unless otherwise instructed by your surgeon), Aleve, Naproxen, Ibuprofen, Motrin, Advil, Goody's, BC's, all herbal medications, fish oil, and all vitamins     Do not wear jewelry  Do not wear lotions, powders, or colognes, or deodorant.  Do not shave 48 hours prior to surgery.  Men may shave face and neck.   Do not bring valuables to the hospital.  [facility] is not responsible for any belongings or valuables.  Contacts, dentures or bridgework may not be worn into surgery.  Leave your suitcase in the car.  After surgery it may be brought to your room.  For patients admitted to the hospital, discharge time will be determined by your treatment team.  Patients discharged the day of surgery will not be allowed to drive home.    [place]- Preparing For Surgery  Before surgery, you can play an important role. Because skin is not sterile, your skin needs to be as free of germs as possible. You can reduce the number of germs on your skin by washing with CHG (chlorahexidine gluconate) Soap before surgery.  CHG is an antiseptic cleaner which kills germs and bonds with the skin to continue killing germs even after washing.    Oral Hygiene is also important to reduce your risk of infection.  Remember - BRUSH YOUR TEETH THE MORNING OF SURGERY WITH YOUR REGULAR  TOOTHPASTE  Please do not use if you have an allergy to CHG or antibacterial soaps. If your skin becomes reddened/irritated stop using the CHG.  Do not shave (including legs and underarms) for at least 48 hours prior to first CHG shower. It is OK to shave your face.  Please follow these instructions carefully.   1. Shower the NIGHT BEFORE SURGERY and the MORNING OF SURGERY with CHG.   2. If you chose to wash your hair, wash your hair first as usual with your normal shampoo.  3. After you shampoo, rinse your hair and body thoroughly to remove the shampoo.  4. Use CHG as you would any other liquid soap. You can apply CHG directly to the skin and wash gently with a scrungie or a clean washcloth.   5. Apply the CHG Soap to your body ONLY FROM THE NECK DOWN.  Do not use on open wounds or open sores. Avoid contact with your eyes, ears, mouth and genitals (private parts). Wash Face and genitals (private parts)  with your normal soap.  6. Wash thoroughly, paying special attention to the area where your surgery will be performed.  7. Thoroughly rinse your body with warm water from the neck down.  8. DO NOT shower/wash with your normal soap after using and rinsing off the CHG Soap.  9. Pat yourself dry with a CLEAN TOWEL.  10. Wear CLEAN  PAJAMAS to bed the night before surgery, wear comfortable clothes the morning of surgery  11. Place CLEAN SHEETS on your bed the night of your first shower and DO NOT SLEEP WITH PETS.    Day of Surgery:  Do not apply any deodorants/lotions.  Please wear clean clothes to the hospital/surgery center.   Remember to brush your teeth WITH YOUR REGULAR TOOTHPASTE.    Please read over the following fact sheets that you were given. Coughing and Deep Breathing, MRSA Information and Surgical Site Infection Prevention

## 2018-06-10 ENCOUNTER — Other Ambulatory Visit: Payer: Self-pay

## 2018-06-10 ENCOUNTER — Encounter (HOSPITAL_COMMUNITY)
Admission: RE | Admit: 2018-06-10 | Discharge: 2018-06-10 | Disposition: A | Discharge status: Home / Self Care | Payer: Medicare Other | Source: Ambulatory Visit | Attending: Neurological Surgery | Admitting: Neurological Surgery

## 2018-06-10 ENCOUNTER — Encounter (HOSPITAL_COMMUNITY): Payer: Self-pay

## 2018-06-10 ENCOUNTER — Ambulatory Visit (HOSPITAL_COMMUNITY)
Admission: RE | Admit: 2018-06-10 | Discharge: 2018-06-10 | Disposition: A | Discharge status: Home / Self Care | Payer: Medicare Other | Source: Ambulatory Visit | Attending: Neurological Surgery | Admitting: Neurological Surgery

## 2018-06-10 DIAGNOSIS — E785 Hyperlipidemia, unspecified: Secondary | ICD-10-CM | POA: Diagnosis not present

## 2018-06-10 DIAGNOSIS — Z7982 Long term (current) use of aspirin: Secondary | ICD-10-CM | POA: Diagnosis not present

## 2018-06-10 DIAGNOSIS — M48061 Spinal stenosis, lumbar region without neurogenic claudication: Secondary | ICD-10-CM | POA: Diagnosis not present

## 2018-06-10 DIAGNOSIS — Z4682 Encounter for fitting and adjustment of non-vascular catheter: Secondary | ICD-10-CM | POA: Diagnosis not present

## 2018-06-10 DIAGNOSIS — Z9889 Other specified postprocedural states: Secondary | ICD-10-CM | POA: Diagnosis not present

## 2018-06-10 DIAGNOSIS — Z01818 Encounter for other preprocedural examination: Secondary | ICD-10-CM | POA: Diagnosis not present

## 2018-06-10 DIAGNOSIS — Z79899 Other long term (current) drug therapy: Secondary | ICD-10-CM | POA: Diagnosis not present

## 2018-06-10 DIAGNOSIS — E860 Dehydration: Secondary | ICD-10-CM | POA: Insufficient documentation

## 2018-06-10 LAB — CBC WITH DIFFERENTIAL/PLATELET
ABS IMMATURE GRANULOCYTES: 0 10*3/uL (ref 0.0–0.1)
BASOS PCT: 1 %
Basophils Absolute: 0 10*3/uL (ref 0.0–0.1)
Eosinophils Absolute: 0.3 10*3/uL (ref 0.0–0.7)
Eosinophils Relative: 5 %
HCT: 44 % (ref 39.0–52.0)
HEMOGLOBIN: 14.3 g/dL (ref 13.0–17.0)
Immature Granulocytes: 0 %
LYMPHS ABS: 2.8 10*3/uL (ref 0.7–4.0)
LYMPHS PCT: 43 %
MCH: 30.2 pg (ref 26.0–34.0)
MCHC: 32.5 g/dL (ref 30.0–36.0)
MCV: 92.8 fL (ref 78.0–100.0)
MONO ABS: 0.7 10*3/uL (ref 0.1–1.0)
MONOS PCT: 11 %
NEUTROS ABS: 2.6 10*3/uL (ref 1.7–7.7)
NEUTROS PCT: 40 %
PLATELETS: 156 10*3/uL (ref 150–400)
RBC: 4.74 MIL/uL (ref 4.22–5.81)
RDW: 11.4 % — ABNORMAL LOW (ref 11.5–15.5)
WBC: 6.5 10*3/uL (ref 4.0–10.5)

## 2018-06-10 LAB — BASIC METABOLIC PANEL
ANION GAP: 15 (ref 5–15)
BUN: 13 mg/dL (ref 8–23)
CHLORIDE: 100 mmol/L (ref 98–111)
CO2: 23 mmol/L (ref 22–32)
Calcium: 9 mg/dL (ref 8.9–10.3)
Creatinine, Ser: 1.39 mg/dL — ABNORMAL HIGH (ref 0.61–1.24)
GFR calc non Af Amer: 50 mL/min — ABNORMAL LOW (ref >60)
GFR, EST AFRICAN AMERICAN: 58 mL/min — AB (ref >60)
GLUCOSE: 111 mg/dL — AB (ref 70–99)
Potassium: 3 mmol/L — ABNORMAL LOW (ref 3.5–5.1)
SODIUM: 138 mmol/L (ref 135–145)

## 2018-06-10 LAB — PROTIME-INR
INR: 1.1
Prothrombin Time: 14.1 seconds (ref 11.4–15.2)

## 2018-06-10 LAB — SURGICAL PCR SCREEN
MRSA, PCR: NEGATIVE
STAPHYLOCOCCUS AUREUS: NEGATIVE

## 2018-06-10 NOTE — Progress Notes (Signed)
PCP - Cathlean Cower MD  Chest x-ray -  Pending 06/10/18 EKG - 06/10/18 ECHO - 05/2018  Aspirin Instructions: stop 7 Days prior  Anesthesia review: none  Patient denies shortness of breath, fever, cough and chest pain at PAT appointment   Patient verbalized understanding of instructions that were given to them at the PAT appointment. Patient was also instructed that they will need to review over the PAT instructions again at home before surgery.

## 2018-06-10 NOTE — Progress Notes (Signed)
Anesthesia Chart Review:  Case:  [id number] Date/Time:  06/20/18 1045   Procedure:  Laminectomy and Foraminotomy - L1-L2 - L2-L3 - L3-L4 - L4-L5 (N/A Back)   Anesthesia type:  General   Pre-op diagnosis:  Stenosis   Location:  [facility] OR ROOM 20 / [facility]   Surgeon:  Eustace Moore, MD      DISCUSSION: 67 yo male never smoker. History includes never smoker, HLD, chronic back pain, impaired glucose tolerance, syncope 04/13/14 (in the setting of orthostatic hypotension, dehydration, overheating while working outside). S/p posterior cervical fusion 01/03/2018.  Patient with known abnormal EKG, chronic inferior and anterolateral T wave changes since at least 2010, with prior cardiology evaluation. He has had ongoing primary care follow-up with Dr. Jenny Reichmann, who last saw him in 05/17/2018. At that time he was having some new ankle swelling, thought to be likely venous insufficiency. Echo ordered to rule out cardiac etiology done 05/20/2018 showed Normal LV size with mild LV hypertrophy. EF 55%. Moderate diastolic dysfunction. Normal RV size and systolic function. No significant valvular abnormalities.  Holding ASA 7d preop.  Recently underwent posterior cervical fusion without issue. Anticipate he can proceed as planned barring acute status change.   VS: BP (!) 136/93   Pulse 94   Temp 36.5 C   Resp 20   Ht 5\' 10"  (1.778 m)   Wt 95.8 kg   SpO2 100%   BMI 30.30 kg/m    PROVIDERS: Biagio Borg, MD is PCP   LABS: Labs reviewed: Acceptable for surgery. Mild hypokalemia. Review of past labs suggests mild renal insufficiency with baseline creatinine ~1.3.   Ref. Range 08/22/2017 16:32 12/26/2017 11:29 02/15/2018 11:38 05/17/2018 11:34 06/10/2018 11:37  Creatinine Latest Ref Range: 0.61 - 1.24 mg/dL 1.22 1.32 (H) 1.15 1.31 1.39 (H)   (all labs ordered are listed, but only abnormal results are displayed)  Labs Reviewed  BASIC METABOLIC PANEL - Abnormal; Notable for the following components:      Result Value    Potassium 3.0 (*)    Glucose, Bld 111 (*)    Creatinine, Ser 1.39 (*)    GFR calc non Af Amer 50 (*)    GFR calc Af Amer 58 (*)    All other components within normal limits  CBC WITH DIFFERENTIAL/PLATELET - Abnormal; Notable for the following components:   RDW 11.4 (*)    All other components within normal limits  SURGICAL PCR SCREEN  PROTIME-INR     IMAGES: CHEST - 2 VIEW 06/10/18  COMPARISON:  05/17/2018  FINDINGS: The heart size and mediastinal contours are within normal limits. Both lungs are clear. The visualized skeletal structures are unremarkable. Remote cervical fusion.  IMPRESSION: No evidence for acute cardiopulmonary abnormality.   EKG: 9/30/2019Sinus bradycardia 57bpm. T wave abnormality, consider inferior ischemia. No significant change from previous.   CV: TTE 05/20/2018: Study Conclusions  - Left ventricle: The cavity size was normal. Wall thickness was   increased in a pattern of mild LVH. Systolic function was normal.   The estimated ejection fraction was 55%. Wall motion was normal;   there were no regional wall motion abnormalities. Features are   consistent with a pseudonormal left ventricular filling pattern,   with concomitant abnormal relaxation and increased filling   pressure (grade 2 diastolic dysfunction). - Aortic valve: There was no stenosis. There was trivial   regurgitation. - Aorta: Dilated aortic root. Aortic root dimension: 41 mm (ED).   Ascending aortic diameter: 38 mm (S). -  Mitral valve: There was trivial regurgitation. - Right ventricle: The cavity size was normal. Systolic function   was normal. - Right atrium: The atrium was mildly dilated. - Pulmonary arteries: No complete TR doppler jet so unable to   estimate PA systolic pressure. - Inferior vena cava: The vessel was normal in size. The   respirophasic diameter changes were in the normal range (>= 50%),   consistent with normal central venous  pressure.  Impressions:  - Normal LV size with mild LV hypertrophy. EF 55%. Moderate   diastolic dysfunction. Normal RV size and systolic function. No   significant valvular abnormalities.  Past Medical History:  Diagnosis Date  . Arthritis    Neck, bilateral hands  . BACK PAIN, CHRONIC 10/31/2010  . Cervical stenosis of spine   . Chronic lumbar radiculopathy 05/20/2015  . Diastolic dysfunction 5/45/6256  . Family history of colon cancer 2013/01/18   Father died at 46yo  . HYPERLIPIDEMIA 09/02/2007  . Impaired glucose tolerance 08/29/2011  . PARESTHESIA 09/02/2007  . Toxic effect of chlorine gas(987.6) 10/31/2010    Past Surgical History:  Procedure Laterality Date  . BUNIONECTOMY    . COLONOSCOPY    . FOOT SURGERY Right   . mass removal     back, forehead; benign (lipoma)   . mass removal  2011   head; benign  . POSTERIOR CERVICAL FUSION/FORAMINOTOMY N/A 01/03/2018   Procedure: Posterior Cervical Fusion with lateral mass fixation - Cervical three - Cervical seven, cervical laminectomy Cervical three-cervical seven;  Surgeon: Eustace Moore, MD;  Location: [facility];  Service: Neurosurgery;  Laterality: N/A;  . ROOT CANAL    . s/p lipoma right scalp posteriorly  2011    MEDICATIONS: . aspirin EC 81 MG tablet  . furosemide (LASIX) 20 MG tablet  . HYDROcodone-acetaminophen (NORCO) 10-325 MG tablet  . methocarbamol (ROBAXIN) 500 MG tablet  . rosuvastatin (CRESTOR) 20 MG tablet  . sildenafil (VIAGRA) 100 MG tablet   No current facility-administered medications for this encounter.      Wynonia Musty [facility] Short Stay Center/Anesthesiology Phone [phone] 06/10/2018 4:38 PM

## 2018-06-20 ENCOUNTER — Inpatient Hospital Stay (HOSPITAL_COMMUNITY): Payer: Medicare Other | Admitting: Certified Registered"

## 2018-06-20 ENCOUNTER — Inpatient Hospital Stay (HOSPITAL_COMMUNITY): Payer: Medicare Other | Admitting: Physician Assistant

## 2018-06-20 ENCOUNTER — Other Ambulatory Visit: Payer: Self-pay

## 2018-06-20 ENCOUNTER — Encounter (HOSPITAL_COMMUNITY)
Admission: RE | Disposition: A | Discharge status: Home / Self Care | Payer: Self-pay | Source: Ambulatory Visit | Attending: Neurological Surgery

## 2018-06-20 ENCOUNTER — Observation Stay (HOSPITAL_COMMUNITY)
Admission: RE | Admit: 2018-06-20 | Discharge: 2018-06-22 | Disposition: A | Discharge status: Home / Self Care | Payer: Medicare Other | Source: Ambulatory Visit | Attending: Neurological Surgery | Admitting: Neurological Surgery

## 2018-06-20 ENCOUNTER — Inpatient Hospital Stay (HOSPITAL_COMMUNITY): Payer: Medicare Other

## 2018-06-20 ENCOUNTER — Encounter (HOSPITAL_COMMUNITY): Payer: Self-pay

## 2018-06-20 DIAGNOSIS — Z981 Arthrodesis status: Secondary | ICD-10-CM | POA: Diagnosis not present

## 2018-06-20 DIAGNOSIS — E669 Obesity, unspecified: Secondary | ICD-10-CM | POA: Diagnosis not present

## 2018-06-20 DIAGNOSIS — Z7982 Long term (current) use of aspirin: Secondary | ICD-10-CM | POA: Diagnosis not present

## 2018-06-20 DIAGNOSIS — Z6830 Body mass index (BMI) 30.0-30.9, adult: Secondary | ICD-10-CM | POA: Diagnosis not present

## 2018-06-20 DIAGNOSIS — E785 Hyperlipidemia, unspecified: Secondary | ICD-10-CM | POA: Insufficient documentation

## 2018-06-20 DIAGNOSIS — M48061 Spinal stenosis, lumbar region without neurogenic claudication: Principal | ICD-10-CM | POA: Insufficient documentation

## 2018-06-20 DIAGNOSIS — M48062 Spinal stenosis, lumbar region with neurogenic claudication: Secondary | ICD-10-CM | POA: Diagnosis not present

## 2018-06-20 DIAGNOSIS — Z9889 Other specified postprocedural states: Secondary | ICD-10-CM

## 2018-06-20 DIAGNOSIS — Z419 Encounter for procedure for purposes other than remedying health state, unspecified: Secondary | ICD-10-CM

## 2018-06-20 DIAGNOSIS — Z79899 Other long term (current) drug therapy: Secondary | ICD-10-CM | POA: Insufficient documentation

## 2018-06-20 HISTORY — PX: LUMBAR LAMINECTOMY/DECOMPRESSION MICRODISCECTOMY: SHX5026

## 2018-06-20 SURGERY — LUMBAR LAMINECTOMY/DECOMPRESSION MICRODISCECTOMY 4 LEVEL
Anesthesia: General | Site: Back

## 2018-06-20 MED ORDER — DEXAMETHASONE SODIUM PHOSPHATE 10 MG/ML IJ SOLN
10.0000 mg | INTRAMUSCULAR | Status: AC
  Administered 2018-06-20: 10 mg via INTRAVENOUS

## 2018-06-20 MED ORDER — LACTATED RINGERS IV SOLN
INTRAVENOUS | Status: DC | PRN
  Administered 2018-06-20 (×2): via INTRAVENOUS

## 2018-06-20 MED ORDER — SODIUM CHLORIDE 0.9% FLUSH: 3.0000 mL | Freq: Two times a day (BID) | INTRAVENOUS | Status: DC

## 2018-06-20 MED ORDER — HYDROMORPHONE HCL 1 MG/ML IJ SOLN
INTRAMUSCULAR | Status: AC
  Filled 2018-06-20: qty 1

## 2018-06-20 MED ORDER — MEPERIDINE HCL 50 MG/ML IJ SOLN: 6.2500 mg | INTRAMUSCULAR | Status: DC | PRN

## 2018-06-20 MED ORDER — ONDANSETRON HCL 4 MG/2ML IJ SOLN
INTRAMUSCULAR | Status: AC
  Filled 2018-06-20: qty 4

## 2018-06-20 MED ORDER — MIDAZOLAM HCL 2 MG/2ML IJ SOLN
INTRAMUSCULAR | Status: AC
  Filled 2018-06-20: qty 2

## 2018-06-20 MED ORDER — LIDOCAINE 2% (20 MG/ML) 5 ML SYRINGE
INTRAMUSCULAR | Status: DC | PRN
  Administered 2018-06-20: 60 mg via INTRAVENOUS

## 2018-06-20 MED ORDER — POTASSIUM CHLORIDE IN NACL 20-0.9 MEQ/L-% IV SOLN: INTRAVENOUS | Status: DC

## 2018-06-20 MED ORDER — METHOCARBAMOL 500 MG PO TABS
ORAL_TABLET | ORAL | Status: AC
  Filled 2018-06-20: qty 1

## 2018-06-20 MED ORDER — MIDAZOLAM HCL 5 MG/5ML IJ SOLN
INTRAMUSCULAR | Status: DC | PRN
  Administered 2018-06-20: 2 mg via INTRAVENOUS

## 2018-06-20 MED ORDER — CEFAZOLIN SODIUM-DEXTROSE 2-4 GM/100ML-% IV SOLN
2.0000 g | INTRAVENOUS | Status: AC
  Administered 2018-06-20: 2 g via INTRAVENOUS

## 2018-06-20 MED ORDER — ONDANSETRON HCL 4 MG/2ML IJ SOLN
INTRAMUSCULAR | Status: DC | PRN
  Administered 2018-06-20: 4 mg via INTRAVENOUS

## 2018-06-20 MED ORDER — FENTANYL CITRATE (PF) 250 MCG/5ML IJ SOLN
INTRAMUSCULAR | Status: AC
  Filled 2018-06-20: qty 5

## 2018-06-20 MED ORDER — CELECOXIB 200 MG PO CAPS
200.0000 mg | ORAL_CAPSULE | Freq: Two times a day (BID) | ORAL | Status: DC
  Administered 2018-06-20 – 2018-06-22 (×4): 200 mg via ORAL
  Filled 2018-06-20 (×4): qty 1

## 2018-06-20 MED ORDER — MENTHOL 3 MG MT LOZG: 1.0000 | LOZENGE | OROMUCOSAL | Status: DC | PRN

## 2018-06-20 MED ORDER — THROMBIN 5000 UNITS EX SOLR
CUTANEOUS | Status: AC
  Filled 2018-06-20: qty 15000

## 2018-06-20 MED ORDER — METHOCARBAMOL 500 MG PO TABS
500.0000 mg | ORAL_TABLET | Freq: Four times a day (QID) | ORAL | Status: DC | PRN
  Administered 2018-06-20 – 2018-06-22 (×5): 500 mg via ORAL
  Filled 2018-06-20 (×4): qty 1

## 2018-06-20 MED ORDER — PHENYLEPHRINE 40 MCG/ML (10ML) SYRINGE FOR IV PUSH (FOR BLOOD PRESSURE SUPPORT)
PREFILLED_SYRINGE | INTRAVENOUS | Status: DC | PRN
  Administered 2018-06-20: 40 ug via INTRAVENOUS

## 2018-06-20 MED ORDER — BUPIVACAINE HCL (PF) 0.25 % IJ SOLN
INTRAMUSCULAR | Status: AC
  Filled 2018-06-20: qty 30

## 2018-06-20 MED ORDER — THROMBIN 5000 UNITS EX SOLR
CUTANEOUS | Status: DC | PRN
  Administered 2018-06-20 (×2): 5000 [IU] via TOPICAL

## 2018-06-20 MED ORDER — FENTANYL CITRATE (PF) 100 MCG/2ML IJ SOLN
INTRAMUSCULAR | Status: DC | PRN
  Administered 2018-06-20: 50 ug via INTRAVENOUS
  Administered 2018-06-20: 150 ug via INTRAVENOUS

## 2018-06-20 MED ORDER — LACTATED RINGERS IV SOLN: INTRAVENOUS | Status: DC

## 2018-06-20 MED ORDER — SUGAMMADEX SODIUM 200 MG/2ML IV SOLN
INTRAVENOUS | Status: DC | PRN
  Administered 2018-06-20: 400 mg via INTRAVENOUS

## 2018-06-20 MED ORDER — SODIUM CHLORIDE 0.9 % IV SOLN
INTRAVENOUS | Status: DC | PRN
  Administered 2018-06-20: 50 ug/min via INTRAVENOUS

## 2018-06-20 MED ORDER — SODIUM CHLORIDE 0.9 % IV SOLN
INTRAVENOUS | Status: DC | PRN
  Administered 2018-06-20: 12:00:00

## 2018-06-20 MED ORDER — HYDROCODONE-ACETAMINOPHEN 7.5-325 MG PO TABS: 1.0000 | ORAL_TABLET | Freq: Four times a day (QID) | ORAL | Status: DC

## 2018-06-20 MED ORDER — HYDROMORPHONE HCL 1 MG/ML IJ SOLN
0.5000 mg | INTRAMUSCULAR | Status: DC | PRN
  Administered 2018-06-20: 0.5 mg via INTRAVENOUS
  Filled 2018-06-20: qty 0.5

## 2018-06-20 MED ORDER — ROCURONIUM BROMIDE 10 MG/ML (PF) SYRINGE
PREFILLED_SYRINGE | INTRAVENOUS | Status: DC | PRN
  Administered 2018-06-20: 20 mg via INTRAVENOUS
  Administered 2018-06-20: 50 mg via INTRAVENOUS
  Administered 2018-06-20: 20 mg via INTRAVENOUS

## 2018-06-20 MED ORDER — HYDROMORPHONE HCL 1 MG/ML IJ SOLN
0.2500 mg | INTRAMUSCULAR | Status: DC | PRN
  Administered 2018-06-20 (×3): 0.5 mg via INTRAVENOUS

## 2018-06-20 MED ORDER — SENNA 8.6 MG PO TABS
1.0000 | ORAL_TABLET | Freq: Two times a day (BID) | ORAL | Status: DC
  Administered 2018-06-20 – 2018-06-22 (×4): 8.6 mg via ORAL
  Filled 2018-06-20 (×4): qty 1

## 2018-06-20 MED ORDER — HYDROCODONE-ACETAMINOPHEN 7.5-325 MG PO TABS
1.0000 | ORAL_TABLET | ORAL | Status: DC | PRN
  Administered 2018-06-20 – 2018-06-22 (×10): 2 via ORAL
  Filled 2018-06-20 (×10): qty 2

## 2018-06-20 MED ORDER — DEXAMETHASONE SODIUM PHOSPHATE 10 MG/ML IJ SOLN
INTRAMUSCULAR | Status: AC
  Filled 2018-06-20: qty 4

## 2018-06-20 MED ORDER — CHLORHEXIDINE GLUCONATE CLOTH 2 % EX PADS: 6.0000 | MEDICATED_PAD | Freq: Once | CUTANEOUS | Status: DC

## 2018-06-20 MED ORDER — HEMOSTATIC AGENTS (NO CHARGE) OPTIME
TOPICAL | Status: DC | PRN
  Administered 2018-06-20: 1 via TOPICAL

## 2018-06-20 MED ORDER — EPHEDRINE 5 MG/ML INJ
INTRAVENOUS | Status: AC
  Filled 2018-06-20: qty 10

## 2018-06-20 MED ORDER — ONDANSETRON HCL 4 MG/2ML IJ SOLN: 4.0000 mg | Freq: Four times a day (QID) | INTRAMUSCULAR | Status: DC | PRN

## 2018-06-20 MED ORDER — 0.9 % SODIUM CHLORIDE (POUR BTL) OPTIME
TOPICAL | Status: DC | PRN
  Administered 2018-06-20: 1000 mL

## 2018-06-20 MED ORDER — METHOCARBAMOL 1000 MG/10ML IJ SOLN
500.0000 mg | Freq: Four times a day (QID) | INTRAVENOUS | Status: DC | PRN
  Filled 2018-06-20: qty 5

## 2018-06-20 MED ORDER — CEFAZOLIN SODIUM-DEXTROSE 2-4 GM/100ML-% IV SOLN
2.0000 g | Freq: Three times a day (TID) | INTRAVENOUS | Status: AC
  Administered 2018-06-20 – 2018-06-21 (×2): 2 g via INTRAVENOUS
  Filled 2018-06-20 (×2): qty 100

## 2018-06-20 MED ORDER — CEFAZOLIN SODIUM-DEXTROSE 2-4 GM/100ML-% IV SOLN
INTRAVENOUS | Status: AC
  Filled 2018-06-20: qty 100

## 2018-06-20 MED ORDER — PROPOFOL 10 MG/ML IV BOLUS
INTRAVENOUS | Status: AC
  Filled 2018-06-20: qty 20

## 2018-06-20 MED ORDER — PHENOL 1.4 % MT LIQD: 1.0000 | OROMUCOSAL | Status: DC | PRN

## 2018-06-20 MED ORDER — THROMBIN 5000 UNITS EX SOLR
OROMUCOSAL | Status: DC | PRN
  Administered 2018-06-20: 12:00:00 via TOPICAL

## 2018-06-20 MED ORDER — ONDANSETRON HCL 4 MG PO TABS: 4.0000 mg | ORAL_TABLET | Freq: Four times a day (QID) | ORAL | Status: DC | PRN

## 2018-06-20 MED ORDER — ASPIRIN EC 81 MG PO TBEC
81.0000 mg | DELAYED_RELEASE_TABLET | Freq: Every day | ORAL | Status: DC
  Administered 2018-06-20 – 2018-06-22 (×3): 81 mg via ORAL
  Filled 2018-06-20 (×3): qty 1

## 2018-06-20 MED ORDER — ACETAMINOPHEN 650 MG RE SUPP: 650.0000 mg | RECTAL | Status: DC | PRN

## 2018-06-20 MED ORDER — FUROSEMIDE 20 MG PO TABS
20.0000 mg | ORAL_TABLET | Freq: Every day | ORAL | Status: DC
  Administered 2018-06-20 – 2018-06-22 (×3): 20 mg via ORAL
  Filled 2018-06-20 (×3): qty 1

## 2018-06-20 MED ORDER — PROPOFOL 10 MG/ML IV BOLUS
INTRAVENOUS | Status: DC | PRN
  Administered 2018-06-20: 150 mg via INTRAVENOUS
  Administered 2018-06-20: 50 mg via INTRAVENOUS

## 2018-06-20 MED ORDER — SODIUM CHLORIDE 0.9% FLUSH: 3.0000 mL | INTRAVENOUS | Status: DC | PRN

## 2018-06-20 MED ORDER — LACTATED RINGERS IV SOLN
INTRAVENOUS | Status: DC
  Administered 2018-06-20: 10:00:00 via INTRAVENOUS

## 2018-06-20 MED ORDER — SODIUM CHLORIDE 0.9 % IV SOLN: 250.0000 mL | INTRAVENOUS | Status: DC

## 2018-06-20 MED ORDER — ROCURONIUM BROMIDE 50 MG/5ML IV SOSY
PREFILLED_SYRINGE | INTRAVENOUS | Status: AC
  Filled 2018-06-20: qty 20

## 2018-06-20 MED ORDER — PROMETHAZINE HCL 25 MG/ML IJ SOLN: 6.2500 mg | INTRAMUSCULAR | Status: DC | PRN

## 2018-06-20 MED ORDER — PHENYLEPHRINE 40 MCG/ML (10ML) SYRINGE FOR IV PUSH (FOR BLOOD PRESSURE SUPPORT)
PREFILLED_SYRINGE | INTRAVENOUS | Status: AC
  Filled 2018-06-20: qty 10

## 2018-06-20 MED ORDER — ACETAMINOPHEN 325 MG PO TABS: 650.0000 mg | ORAL_TABLET | ORAL | Status: DC | PRN

## 2018-06-20 MED ORDER — DEXAMETHASONE SODIUM PHOSPHATE 10 MG/ML IJ SOLN
INTRAMUSCULAR | Status: AC
  Filled 2018-06-20: qty 1

## 2018-06-20 MED ORDER — BUPIVACAINE HCL (PF) 0.25 % IJ SOLN
INTRAMUSCULAR | Status: DC | PRN
  Administered 2018-06-20: 7 mL

## 2018-06-20 SURGICAL SUPPLY — 47 items
BAG DECANTER FOR FLEXI CONT (MISCELLANEOUS) ×3 IMPLANT
BENZOIN TINCTURE PRP APPL 2/3 (GAUZE/BANDAGES/DRESSINGS) ×3 IMPLANT
BUR MATCHSTICK NEURO 3.0 LAGG (BURR) ×3 IMPLANT
CANISTER SUCT 3000ML PPV (MISCELLANEOUS) ×3 IMPLANT
CARTRIDGE OIL MAESTRO DRILL (MISCELLANEOUS) ×1 IMPLANT
CLOSURE WOUND 1/2 X4 (GAUZE/BANDAGES/DRESSINGS) ×1
COVER WAND RF STERILE (DRAPES) ×3 IMPLANT
DERMABOND ADVANCED (GAUZE/BANDAGES/DRESSINGS) ×2
DERMABOND ADVANCED .7 DNX12 (GAUZE/BANDAGES/DRESSINGS) ×1 IMPLANT
DIFFUSER DRILL AIR PNEUMATIC (MISCELLANEOUS) ×3 IMPLANT
DRAPE LAPAROTOMY 100X72X124 (DRAPES) ×3 IMPLANT
DRAPE MICROSCOPE LEICA (MISCELLANEOUS) IMPLANT
DRAPE POUCH INSTRU U-SHP 10X18 (DRAPES) IMPLANT
DRAPE SURG 17X23 STRL (DRAPES) ×3 IMPLANT
DRSG OPSITE POSTOP 4X6 (GAUZE/BANDAGES/DRESSINGS) ×3 IMPLANT
DURAPREP 26ML APPLICATOR (WOUND CARE) ×3 IMPLANT
ELECT REM PT RETURN 9FT ADLT (ELECTROSURGICAL) ×3
ELECTRODE REM PT RTRN 9FT ADLT (ELECTROSURGICAL) ×1 IMPLANT
GAUZE 4X4 16PLY RFD (DISPOSABLE) IMPLANT
GLOVE BIO SURGEON STRL SZ7 (GLOVE) ×3 IMPLANT
GLOVE BIO SURGEON STRL SZ8 (GLOVE) ×3 IMPLANT
GLOVE BIOGEL PI IND STRL 7.0 (GLOVE) ×1 IMPLANT
GLOVE BIOGEL PI INDICATOR 7.0 (GLOVE) ×2
GOWN STRL REUS W/ TWL LRG LVL3 (GOWN DISPOSABLE) ×4 IMPLANT
GOWN STRL REUS W/ TWL XL LVL3 (GOWN DISPOSABLE) ×1 IMPLANT
GOWN STRL REUS W/TWL 2XL LVL3 (GOWN DISPOSABLE) IMPLANT
GOWN STRL REUS W/TWL LRG LVL3 (GOWN DISPOSABLE) ×8
GOWN STRL REUS W/TWL XL LVL3 (GOWN DISPOSABLE) ×2
HEMOSTAT POWDER KIT SURGIFOAM (HEMOSTASIS) ×3 IMPLANT
KIT BASIN OR (CUSTOM PROCEDURE TRAY) ×3 IMPLANT
KIT TURNOVER KIT B (KITS) ×3 IMPLANT
NEEDLE HYPO 25X1 1.5 SAFETY (NEEDLE) ×3 IMPLANT
NEEDLE SPNL 20GX3.5 QUINCKE YW (NEEDLE) ×6 IMPLANT
NS IRRIG 1000ML POUR BTL (IV SOLUTION) ×3 IMPLANT
OIL CARTRIDGE MAESTRO DRILL (MISCELLANEOUS) ×3
PACK LAMINECTOMY NEURO (CUSTOM PROCEDURE TRAY) ×3 IMPLANT
PAD ARMBOARD 7.5X6 YLW CONV (MISCELLANEOUS) ×9 IMPLANT
RUBBERBAND STERILE (MISCELLANEOUS) IMPLANT
SPONGE SURGIFOAM ABS GEL SZ50 (HEMOSTASIS) ×3 IMPLANT
STRIP CLOSURE SKIN 1/2X4 (GAUZE/BANDAGES/DRESSINGS) ×2 IMPLANT
SUT VIC AB 0 CT1 18XCR BRD8 (SUTURE) ×1 IMPLANT
SUT VIC AB 0 CT1 8-18 (SUTURE) ×2
SUT VIC AB 2-0 CP2 18 (SUTURE) ×3 IMPLANT
SUT VIC AB 3-0 SH 8-18 (SUTURE) ×6 IMPLANT
TOWEL GREEN STERILE (TOWEL DISPOSABLE) ×3 IMPLANT
TOWEL GREEN STERILE FF (TOWEL DISPOSABLE) ×3 IMPLANT
WATER STERILE IRR 1000ML POUR (IV SOLUTION) ×3 IMPLANT

## 2018-06-20 NOTE — H&P (Signed)
Subjective: Patient is a 67 y.o. male admitted for stenosis. Onset of symptoms was several years ago, gradually worsening since that time.  The pain is rated moderate, and is located at the across the lower back and radiates to legs. He has issues with his gait. . The pain is described as aching and burning and occurs intermittently. The symptoms have been progressive. Symptoms are exacerbated by exercise. MRI or CT showed stenosis lumbar   Past Medical History:  Diagnosis Date  . Arthritis    Neck, bilateral hands  . BACK PAIN, CHRONIC 10/31/2010  . Cervical stenosis of spine   . Chronic lumbar radiculopathy 05/20/2015  . Diastolic dysfunction 2/84/1324  . Family history of colon cancer 02/08/2013   Father died at 2yo  . HYPERLIPIDEMIA 09/02/2007  . Impaired glucose tolerance 08/29/2011  . PARESTHESIA 09/02/2007  . Toxic effect of chlorine gas(987.6) 10/31/2010    Past Surgical History:  Procedure Laterality Date  . BUNIONECTOMY    . COLONOSCOPY    . FOOT SURGERY Right   . mass removal     back, forehead; benign (lipoma)   . mass removal  2011   head; benign  . POSTERIOR CERVICAL FUSION/FORAMINOTOMY N/A 01/03/2018   Procedure: Posterior Cervical Fusion with lateral mass fixation - Cervical three - Cervical seven, cervical laminectomy Cervical three-cervical seven;  Surgeon: Eustace Moore, MD;  Location: [place];  Service: Neurosurgery;  Laterality: N/A;  . ROOT CANAL    . s/p lipoma right scalp posteriorly  2011    Prior to Admission medications   Medication Sig Start Date End Date Taking? Authorizing Provider  aspirin EC 81 MG tablet Take 81 mg by mouth daily.   Yes [provider]  furosemide (LASIX) 20 MG tablet Take 1 tablet (20 mg total) by mouth daily as needed. Patient taking differently: Take 20 mg by mouth daily.  05/17/18  Yes Biagio Borg, MD  rosuvastatin (CRESTOR) 20 MG tablet TAKE 1 TABLET(20 MG) BY MOUTH DAILY Patient taking differently: Take 20 mg by mouth  daily.  02/15/18  Yes Biagio Borg, MD  HYDROcodone-acetaminophen [facility]) 10-325 MG tablet Take 1 tablet by mouth every 6 (six) hours as needed for severe pain. Patient not taking: Reported on 06/03/2018 02/15/18   Biagio Borg, MD  methocarbamol (ROBAXIN) 500 MG tablet Take 1 tablet (500 mg total) by mouth every 6 (six) hours as needed for muscle spasms. Patient not taking: Reported on 06/03/2018 01/06/18   Newman Pies, MD  sildenafil (VIAGRA) 100 MG tablet Take 0.5-1 tablets (50-100 mg total) by mouth daily as needed for erectile dysfunction. Patient not taking: Reported on 06/03/2018 08/22/17   Biagio Borg, MD   Allergies  Allergen Reactions  . Other Other (See Comments)    Nivaquine. Found in [place] and [place] for Malaria treatment  UNSPECIFIED REACTION   . Levofloxacin Other (See Comments)    Irritates gums    Social History   Tobacco Use  . Smoking status: Never Smoker  . Smokeless tobacco: Never Used  Substance Use Topics  . Alcohol use: Yes    Alcohol/week: 0.0 standard drinks    Comment: occasional    Family History  Problem Relation Age of Onset  . Cancer Mother        colon cancer  . Colon cancer Father 31  . Stroke Brother 41  . Esophageal cancer Neg Hx   . Stomach cancer Neg Hx      Review of Systems  Positive ROS: neg  All other systems have been reviewed and were otherwise negative with the exception of those mentioned in the HPI and as above.  Objective: Vital signs in last 24 hours: Temp:  [98.3 F (36.8 C)] 98.3 F (36.8 C) (10/10 0902) Pulse Rate:  [73] 73 (10/10 0902) Resp:  [18] 18 (10/10 0902) BP: (153)/(96) 153/96 (10/10 0902) SpO2:  [100 %] 100 % (10/10 0902) Weight:  [95.8 kg] 95.8 kg (10/10 0909)  General Appearance: Alert, cooperative, no distress, appears stated age Head: Normocephalic, without obvious abnormality, atraumatic Eyes: PERRL, conjunctiva/corneas clear, EOM's intact    Neck: Supple, symmetrical, trachea midline Back:  Symmetric, no curvature, ROM normal, no CVA tenderness Lungs:  respirations unlabored Heart: Regular rate and rhythm Abdomen: Soft, non-tender Extremities: Extremities normal, atraumatic, no cyanosis or edema Pulses: 2+ and symmetric all extremities Skin: Skin color, texture, turgor normal, no rashes or lesions  NEUROLOGIC:   Mental status: Alert and oriented x4,  no aphasia, good attention span, fund of knowledge, and memory Motor Exam - grossly normal Sensory Exam - grossly normal Reflexes: trace Coordination - grossly normal Gait - grossly normal Balance - grossly normal Cranial Nerves: I: smell Not tested  II: visual acuity  OS: nl    OD: nl  II: visual fields Full to confrontation  II: pupils Equal, round, reactive to light  III,VII: ptosis None  III,IV,VI: extraocular muscles  Full ROM  V: mastication Normal  V: facial light touch sensation  Normal  V,VII: corneal reflex  Present  VII: facial muscle function - upper  Normal  VII: facial muscle function - lower Normal  VIII: hearing Not tested  IX: soft palate elevation  Normal  IX,X: gag reflex Present  XI: trapezius strength  5/5  XI: sternocleidomastoid strength 5/5  XI: neck flexion strength  5/5  XII: tongue strength  Normal    Data Review Lab Results  Component Value Date   WBC 6.5 06/10/2018   HGB 14.3 06/10/2018   HCT 44.0 06/10/2018   MCV 92.8 06/10/2018   PLT 156 06/10/2018   Lab Results  Component Value Date   NA 138 06/10/2018   K 3.0 (L) 06/10/2018   CL 100 06/10/2018   CO2 23 06/10/2018   BUN 13 06/10/2018   CREATININE 1.39 (H) 06/10/2018   GLUCOSE 111 (H) 06/10/2018   Lab Results  Component Value Date   INR 1.10 06/10/2018    Assessment/Plan:  Estimated body mass index is 30.3 kg/m as calculated from the following:   Height as of this encounter: 5\' 10"  (1.778 m).   Weight as of this encounter: 95.8 kg. Patient admitted for LL L1-4. Patient has failed a reasonable attempt at  conservative therapy.  I explained the condition and procedure to the patient and answered any questions.  Patient wishes to proceed with procedure as planned. Understands risks/ benefits and typical outcomes of procedure.   Dalante Minus S 06/20/2018 11:06 AM

## 2018-06-20 NOTE — Anesthesia Preprocedure Evaluation (Signed)
Anesthesia Evaluation  Patient identified by MRN, date of birth, ID band Patient awake    Reviewed: Allergy & Precautions, NPO status , Patient's Chart, lab work & pertinent test results  History of Anesthesia Complications Negative for: history of anesthetic complications  Airway Mallampati: II  TM Distance: >3 FB Neck ROM: Full    Dental  (+) Teeth Intact   Pulmonary neg pulmonary ROS,    breath sounds clear to auscultation       Cardiovascular negative cardio ROS   Rhythm:Regular     Neuro/Psych PSYCHIATRIC DISORDERS Anxiety  Neuromuscular disease    GI/Hepatic negative GI ROS,   Endo/Other  negative endocrine ROS  Renal/GU Renal InsufficiencyRenal disease     Musculoskeletal  (+) Arthritis ,   Abdominal   Peds  Hematology negative hematology ROS (+)   Anesthesia Other Findings tte 9/19:  Left ventricle: The cavity size was normal. Wall thickness was   increased in a pattern of mild LVH. Systolic function was normal.   The estimated ejection fraction was 55%. Wall motion was normal;   there were no regional wall motion abnormalities. Features are   consistent with a pseudonormal left ventricular filling pattern,   with concomitant abnormal relaxation and increased filling   pressure (grade 2 diastolic dysfunction). - Aortic valve: There was no stenosis. There was trivial   regurgitation. - Aorta: Dilated aortic root. Aortic root dimension: 41 mm (ED).   Ascending aortic diameter: 38 mm (S). - Mitral valve: There was trivial regurgitation. - Right ventricle: The cavity size was normal. Systolic function   was normal. - Right atrium: The atrium was mildly dilated. - Pulmonary arteries: No complete TR doppler jet so unable to   estimate PA systolic pressure. - Inferior vena cava: The vessel was normal in size. The   respirophasic diameter changes were in the normal range (>= 50%),   consistent with  normal central venous pressure.  Reproductive/Obstetrics                             Anesthesia Physical Anesthesia Plan  ASA: II  Anesthesia Plan: General   Post-op Pain Management:    Induction: Intravenous  PONV Risk Score and Plan: 2 and Ondansetron and Dexamethasone  Airway Management Planned: Oral ETT  Additional Equipment: None  Intra-op Plan:   Post-operative Plan: Extubation in OR  Informed Consent: I have reviewed the patients History and Physical, chart, labs and discussed the procedure including the risks, benefits and alternatives for the proposed anesthesia with the patient or authorized representative who has indicated his/her understanding and acceptance.   Dental advisory given  Plan Discussed with: CRNA and Surgeon  Anesthesia Plan Comments:         Anesthesia Quick Evaluation

## 2018-06-20 NOTE — Op Note (Signed)
06/20/2018  1:57 PM  PATIENT:  Matthew Tucker  67 y.o. male  PRE-OPERATIVE DIAGNOSIS: Lumbar spinal stenosis L1-2 L2-3 L3-4 L4-5  POST-OPERATIVE DIAGNOSIS:  same  PROCEDURE: Decompressive lumbar laminectomy, medial facetectomy and foraminotomies L1-2 L2-3 L3-4 L4-5  SURGEON:  Sherley Bounds, MD  ASSISTANTS: Meyran FNP  ANESTHESIA:   General  EBL: 50 ml  Total I/O In: 1000 [I.V.:1000] Out: 50 [Blood:50]  BLOOD ADMINISTERED: none  DRAINS: none  SPECIMEN:  none  INDICATION FOR PROCEDURE: This patient presented with back and leg pain and gait instability and numbness in the legs. Imaging showed final stenosis L1-2 L2-3 L3-4 L4-5. The patient tried conservative measures without relief. Pain was debilitating. Recommended decompressive laminectomy to address his spinal stenosis. Patient understood the risks, benefits, and alternatives and potential outcomes and wished to proceed.  PROCEDURE DETAILS: The patient was taken to the operating room and after induction of adequate generalized endotracheal anesthesia, the patient was rolled into the prone position on the Wilson frame and all pressure points were padded. The lumbar region was cleaned and then prepped with DuraPrep and draped in the usual sterile fashion. 5 cc of local anesthesia was injected and then a dorsal midline incision was made and carried down to the lumbo sacral fascia. The fascia was opened and the paraspinous musculature was taken down in a subperiosteal fashion to expose L1-L4 bilaterally. Intraoperative x-ray confirmed my level, and then I removed the spinous processes of L2 and L4 and the bottom half of L1 and L3. I used a combination of the high-speed drill and the Kerrison punches to perform a laminectomy, medial facetectomy, and foraminotomy at L1-2 L2-3 L3-4 and L4-5 bilaterally.  I undercut the bottom of the lamina of L3 and L1 and decompress the lateral recess up to the disc space level at L1-2 and L3-4.   Laminectomies were performed at L2-3 and L4-5 where he had the more severe stenosis.  The underlying yellow ligament was opened and removed in a piecemeal fashion to expose the underlying dura and exiting nerve root. I undercut the lateral recess and dissected down until I was medial to and distal to the pedicle. The nerve root was well decompressed early at all 4 levels.  I then palpated with a coronary dilator along the nerve root and into the foramen to assure adequate decompression. I felt no more compression of the nerve root. I irrigated with saline solution containing bacitracin. Achieved hemostasis with bipolar cautery, lined the dura with Gelfoam, and then closed the fascia with 0 Vicryl. I closed the subcutaneous tissues with 2-0 Vicryl and the subcuticular tissues with 3-0 Vicryl. The skin was then closed with benzoin and Steri-Strips. The drapes were removed, a sterile dressing was applied. The patient was awakened from general anesthesia and transferred to the recovery room in stable condition. At the end of the procedure all sponge, needle and instrument counts were correct.    PLAN OF CARE: Admit for overnight observation  PATIENT DISPOSITION:  PACU - hemodynamically stable.   Delay start of Pharmacological VTE agent (>24hrs) due to surgical blood loss or risk of bleeding:  yes

## 2018-06-20 NOTE — Transfer of Care (Signed)
Immediate Anesthesia Transfer of Care Note  Patient: Natale A Obriant  Procedure(s) Performed: Laminectomy and Foraminotomy - Lumbar one-Lumbar two - Lumbar two-Lumbar three - Lumbar three-Lumbar four - Lumbar four-Lumbar five (N/A Back)  Patient Location: PACU  Anesthesia Type:General  Level of Consciousness: awake, oriented and sedated  Airway & Oxygen Therapy: Patient Spontanous Breathing and Patient connected to nasal cannula oxygen  Post-op Assessment: Report given to RN, Post -op Vital signs reviewed and stable and Patient moving all extremities X 4  Post vital signs: Reviewed and stable  Last Vitals:  Vitals Value Taken Time  BP 150/84 06/20/2018  2:04 PM  Temp    Pulse 56 06/20/2018  2:05 PM  Resp 14 06/20/2018  2:05 PM  SpO2 100 % 06/20/2018  2:05 PM  Vitals shown include unvalidated device data.  Last Pain:  Vitals:   06/20/18 0925  PainSc: 2          Complications: No apparent anesthesia complications

## 2018-06-20 NOTE — Anesthesia Procedure Notes (Signed)
Procedure Name: Intubation Date/Time: 06/20/2018 12:06 PM Performed by: Gaylene Brooks, CRNA Pre-anesthesia Checklist: Patient identified, Emergency Drugs available, Suction available and Patient being monitored Patient Re-evaluated:Patient Re-evaluated prior to induction Oxygen Delivery Method: Circle System Utilized Preoxygenation: Pre-oxygenation with 100% oxygen Induction Type: IV induction Ventilation: Mask ventilation without difficulty Laryngoscope Size: Glidescope and 4 Grade View: Grade I Tube type: Oral Tube size: 7.5 mm Number of attempts: 1 Airway Equipment and Method: Stylet and Oral airway Placement Confirmation: ETT inserted through vocal cords under direct vision,  positive ETCO2 and breath sounds checked- equal and bilateral Secured at: 23 cm Tube secured with: Tape Dental Injury: Teeth and Oropharynx as per pre-operative assessment  Comments: Glidescope used due to history of challenging airway and recent posterior cervical fusion. Intubation with no difficulty.

## 2018-06-20 NOTE — Anesthesia Postprocedure Evaluation (Signed)
Anesthesia Post Note  Patient: Rease A Montalban  Procedure(s) Performed: Laminectomy and Foraminotomy - Lumbar one-Lumbar two - Lumbar two-Lumbar three - Lumbar three-Lumbar four - Lumbar four-Lumbar five (N/A Back)     Patient location during evaluation: PACU Anesthesia Type: General Level of consciousness: awake and alert Pain management: pain level controlled Vital Signs Assessment: post-procedure vital signs reviewed and stable Respiratory status: spontaneous breathing, nonlabored ventilation, respiratory function stable and patient connected to nasal cannula oxygen Cardiovascular status: blood pressure returned to baseline and stable Postop Assessment: no apparent nausea or vomiting Anesthetic complications: no    Last Vitals:  Vitals:   06/20/18 1550 06/20/18 1600  BP: (!) 152/89   Pulse: (!) 53 (!) 55  Resp: 14 16  Temp:    SpO2: 96% 97%    Last Pain:  Vitals:   06/20/18 1600  PainSc: Asleep                 Effie Berkshire

## 2018-06-21 ENCOUNTER — Encounter (HOSPITAL_COMMUNITY): Payer: Self-pay | Admitting: Neurological Surgery

## 2018-06-21 DIAGNOSIS — Z6830 Body mass index (BMI) 30.0-30.9, adult: Secondary | ICD-10-CM | POA: Diagnosis not present

## 2018-06-21 DIAGNOSIS — E785 Hyperlipidemia, unspecified: Secondary | ICD-10-CM | POA: Diagnosis not present

## 2018-06-21 DIAGNOSIS — Z7982 Long term (current) use of aspirin: Secondary | ICD-10-CM | POA: Diagnosis not present

## 2018-06-21 DIAGNOSIS — E669 Obesity, unspecified: Secondary | ICD-10-CM | POA: Diagnosis not present

## 2018-06-21 DIAGNOSIS — Z79899 Other long term (current) drug therapy: Secondary | ICD-10-CM | POA: Diagnosis not present

## 2018-06-21 DIAGNOSIS — M48061 Spinal stenosis, lumbar region without neurogenic claudication: Secondary | ICD-10-CM | POA: Diagnosis not present

## 2018-06-21 MED ORDER — HYDROCODONE-ACETAMINOPHEN 10-325 MG PO TABS: 1.0000 | ORAL_TABLET | Freq: Four times a day (QID) | ORAL | 0 refills | Status: DC | PRN

## 2018-06-21 MED ORDER — METHOCARBAMOL 500 MG PO TABS: 500.0000 mg | ORAL_TABLET | Freq: Four times a day (QID) | ORAL | 1 refills | Status: DC | PRN

## 2018-06-21 NOTE — Care Management Note (Signed)
Case Management Note  Patient Details  Name: Matthew Tucker MRN: [id number] Date of Birth: 1950/10/21  Subjective/Objective:   67 yr old gentleman s/p L1-L4 decompressive lumbar laminectomy.                 Action/Plan: Case manager spoke with patient concerning discharge plan and DME. Choice for Home Health agency was offered, referral was called to [facility], [facility]. Patient will have family support at discharge.    Expected Discharge Date:  06/22/18              Expected Discharge Plan:  [place]  In-House Referral:  NA  Discharge planning Services  CM Consult  Post Acute Care Choice:  Home Health, Durable Medical Equipment Choice offered to:  Patient  DME Arranged:  Walker rolling DME Agency:  [place]:  PT [facility] Agency:  [place]  Status of Service:  Completed, signed off  If discussed at [place] of Stay Meetings, dates discussed:    Additional Comments:  Ninfa Meeker, RN 06/21/2018, 2:41 PM

## 2018-06-21 NOTE — Evaluation (Signed)
Physical Therapy Evaluation Patient Details Name: Matthew Tucker MRN: [id number] DOB: 1951/03/14 Today's Date: 06/21/2018   History of Present Illness  Patient is a 67 y/o male who presents s/p Decompressive lumbar laminectomy, medial facetectomy and foraminotomies L1-2 L2-3 L3-4 L4-5. PMH includes cervical surgery, HLD.  Clinical Impression  Patient presents with pain, impaired balance and post surgical deficits s/p above surgery. Tolerated bed mobility, transfers, gait training and stair training with supervision-Min guard assist for balance/safety. Balance improved with use of RW for support however pt wants to be able to mobilize without it. Education re: back precautions, log roll technique, mobility expectations, positioning etc. Pt independent PTA and lives with wife. Will follow acutely to maximize independence and mobility prior to return home.     Follow Up Recommendations Home health PT;Supervision - Intermittent    Equipment Recommendations  Rolling walker with 5" wheels    Recommendations for Other Services       Precautions / Restrictions Precautions Precautions: Back;Fall Precaution Booklet Issued: Yes (comment) Precaution Comments: Reviewed back precautions and handout Restrictions Weight Bearing Restrictions: No      Mobility  Bed Mobility Overal bed mobility: Needs Assistance Bed Mobility: Rolling;Sidelying to Sit;Sit to Sidelying Rolling: Supervision Sidelying to sit: Supervision     Sit to sidelying: Supervision General bed mobility comments: HOB flat, use of rails and cues for log roll technique. Increased time.  Transfers Overall transfer level: Needs assistance Equipment used: None Transfers: Sit to/from Stand Sit to Stand: Supervision         General transfer comment: Supervision for safety. Slow to rise.   Ambulation/Gait Ambulation/Gait assistance: Min guard Gait Distance (Feet): 300 Feet Assistive device: Rolling walker (2  wheeled);None Gait Pattern/deviations: Step-through pattern;Decreased stride length;Decreased step length - right Gait velocity: decreased   General Gait Details: Slow, mostly steady gait with use of RW; without use of RW pt with decreased step length RLE and dragging it at times.   Stairs Stairs: Yes Stairs assistance: Supervision Stair Management: Alternating pattern;One rail Right Number of Stairs: 13 General stair comments: Cues for technique and safety  Wheelchair Mobility    Modified Rankin (Stroke Patients Only)       Balance Overall balance assessment: Needs assistance Sitting-balance support: Feet supported;No upper extremity supported Sitting balance-Leahy Scale: Good     Standing balance support: During functional activity Standing balance-Leahy Scale: Fair Standing balance comment: Able to stand and walk without UE support but balance impaired, does better with BUE supported on RW for dynamic tasks.                             Pertinent Vitals/Pain Pain Assessment: Faces Faces Pain Scale: Hurts a little bit Pain Location: back Pain Descriptors / Indicators: Operative site guarding;Sore Pain Intervention(s): Monitored during session;Repositioned    Home Living Family/patient expects to be discharged to:: Private residence Living Arrangements: Spouse/significant other Available Help at Discharge: Family;Available PRN/intermittently Type of Home: House Home Access: Stairs to enter Entrance Stairs-Rails: Right Entrance Stairs-Number of Steps: 5 Home Layout: Two level;Able to live on main level with bedroom/bathroom Home Equipment: Kasandra Knudsen - single point      Prior Function Level of Independence: Independent               Hand Dominance        Extremity/Trunk Assessment   Upper Extremity Assessment Upper Extremity Assessment: Defer to OT evaluation    Lower Extremity Assessment Lower Extremity  Assessment: Generalized weakness;RLE  deficits/detail RLE Deficits / Details: Grossly ~4/5 throughout RLE Sensation: decreased light touch    Cervical / Trunk Assessment Cervical / Trunk Assessment: Other exceptions Cervical / Trunk Exceptions: s/p spine surgery  Communication   Communication: No difficulties  Cognition Arousal/Alertness: Awake/alert Behavior During Therapy: WFL for tasks assessed/performed Overall Cognitive Status: Within Functional Limits for tasks assessed                                 General Comments: for basic mobility tasks      General Comments      Exercises     Assessment/Plan    PT Assessment Patient needs continued PT services  PT Problem List Decreased strength;Decreased mobility;Pain;Impaired sensation;Decreased balance;Decreased knowledge of use of DME;Decreased skin integrity;Decreased knowledge of precautions       PT Treatment Interventions DME instruction;Functional mobility training;Balance training;Patient/family education;Gait training;Therapeutic activities;Stair training;Therapeutic exercise    PT Goals (Current goals can be found in the Care Plan section)  Acute Rehab PT Goals Patient Stated Goal: to get back to the gym and working out PT Goal Formulation: With patient Time For Goal Achievement: 07/05/18 Potential to Achieve Goals: Good    Frequency Min 5X/week   Barriers to discharge        Co-evaluation               AM-PAC PT "6 Clicks" Daily Activity  Outcome Measure Difficulty turning over in bed (including adjusting bedclothes, sheets and blankets)?: None Difficulty moving from lying on back to sitting on the side of the bed? : A Little Difficulty sitting down on and standing up from a chair with arms (e.g., wheelchair, bedside commode, etc,.)?: A Little Help needed moving to and from a bed to chair (including a wheelchair)?: None Help needed walking in hospital room?: A Little Help needed climbing 3-5 steps with a railing? : A  Little 6 Click Score: 20    End of Session Equipment Utilized During Treatment: Gait belt Activity Tolerance: Patient tolerated treatment well Patient left: in bed;with call bell/phone within reach Nurse Communication: Mobility status PT Visit Diagnosis: Pain;Unsteadiness on feet (R26.81);Muscle weakness (generalized) (M62.81) Pain - part of body: (back)    Time: 0383-3383 PT Time Calculation (min) (ACUTE ONLY): 29 min   Charges:   PT Evaluation $PT Eval Low Complexity: 1 Low PT Treatments $Gait Training: 8-22 mins        Wray Kearns, PT, DPT Acute Rehabilitation Services Pager [phone] Office [place] 06/21/2018, 9:37 AM

## 2018-06-21 NOTE — Progress Notes (Signed)
Patient ID: Matthew Tucker, male   DOB: 1951/09/01, 67 y.o.   MRN: [id number] Subjective: Patient reports back soreness, no leg pain or NTW, walking better  Objective: Vital signs in last 24 hours: Temp:  [97.3 F (36.3 C)-99.4 F (37.4 C)] 99.4 F (37.4 C) (10/11 0827) Pulse Rate:  [53-89] 89 (10/11 0827) Resp:  [7-19] 18 (10/11 0827) BP: (114-156)/(75-106) 114/75 (10/11 0827) SpO2:  [92 %-100 %] 100 % (10/11 0827) Weight:  [95.8 kg] 95.8 kg (10/10 0909)  Intake/Output from previous day: 10/10 0701 - 10/11 0700 In: 1300 [I.V.:1300] Out: 100 [Blood:100] Intake/Output this shift: No intake/output data recorded.  Neurologic: Grossly normal  Lab Results: Lab Results  Component Value Date   WBC 6.5 06/10/2018   HGB 14.3 06/10/2018   HCT 44.0 06/10/2018   MCV 92.8 06/10/2018   PLT 156 06/10/2018   Lab Results  Component Value Date   INR 1.10 06/10/2018   BMET Lab Results  Component Value Date   NA 138 06/10/2018   K 3.0 (L) 06/10/2018   CL 100 06/10/2018   CO2 23 06/10/2018   GLUCOSE 111 (H) 06/10/2018   BUN 13 06/10/2018   CREATININE 1.39 (H) 06/10/2018   CALCIUM 9.0 06/10/2018    Studies/Results: Dg Lumbar Spine 2-3 Views  Result Date: 06/20/2018 CLINICAL DATA:  L1-L5 decompressive lumbar surgery EXAM: LUMBAR SPINE - 2-3 VIEW COMPARISON:  None. FINDINGS: Two intraoperative LATERAL views of the lumbar spine are submitted postoperatively for interpretation. Film 1 demonstrates 2 posterior metallic probes, the UPPER probe between the between the L2 and L3 spinous processes and the LOWER probe overlying the LOWER aspect of the L4 spinous process. Film 2 demonstrates 2 posterior metallic probes, the UPPER probe between the L2 and L3 spinous processes directed at the LOWER aspect of L2 and the LOWER probe at the level of L4-5 posteriorly. IMPRESSION: Cytogeneticist Signed   By: Margarette Canada M.D.   On: 06/20/2018 15:06    Assessment/Plan: Doing well, home  tomorrow  Estimated body mass index is 30.3 kg/m as calculated from the following:   Height as of this encounter: 5\' 10"  (1.778 m).   Weight as of this encounter: 95.8 kg.    LOS: 1 day    Gunnard Dorrance S 06/21/2018, 8:28 AM

## 2018-06-21 NOTE — Discharge Summary (Signed)
Physician Discharge Summary  Patient ID: Matthew Tucker MRN: [id number] DOB/AGE: 1951-08-01 67 y.o.  Admit date: 06/20/2018 Discharge date: 06/21/2018  Admission Diagnoses: lumbar stenosis    Discharge Diagnoses: same   Discharged Condition: good  Hospital Course: The patient was admitted on 06/20/2018 and taken to the operating room where the patient underwent LL L1-4. The patient tolerated the procedure well and was taken to the recovery room and then to the floor in stable condition. The hospital course was routine. There were no complications. The wound remained clean dry and intact. Pt had appropriate back soreness. No complaints of leg pain or new N/T/W. The patient remained afebrile with stable vital signs, and tolerated a regular diet. The patient continued to increase activities, and pain was well controlled with oral pain medications.   Consults: None  Significant Diagnostic Studies:  Results for orders placed or performed during the hospital encounter of 06/10/18  Surgical pcr screen  Result Value Ref Range   MRSA, PCR NEGATIVE NEGATIVE   Staphylococcus aureus NEGATIVE NEGATIVE  Basic metabolic panel  Result Value Ref Range   Sodium 138 135 - 145 mmol/L   Potassium 3.0 (L) 3.5 - 5.1 mmol/L   Chloride 100 98 - 111 mmol/L   CO2 23 22 - 32 mmol/L   Glucose, Bld 111 (H) 70 - 99 mg/dL   BUN 13 8 - 23 mg/dL   Creatinine, Ser 1.39 (H) 0.61 - 1.24 mg/dL   Calcium 9.0 8.9 - 10.3 mg/dL   GFR calc non Af Amer 50 (L) >60 mL/min   GFR calc Af Amer 58 (L) >60 mL/min   Anion gap 15 5 - 15  CBC WITH DIFFERENTIAL  Result Value Ref Range   WBC 6.5 4.0 - 10.5 K/uL   RBC 4.74 4.22 - 5.81 MIL/uL   Hemoglobin 14.3 13.0 - 17.0 g/dL   HCT 44.0 39.0 - 52.0 %   MCV 92.8 78.0 - 100.0 fL   MCH 30.2 26.0 - 34.0 pg   MCHC 32.5 30.0 - 36.0 g/dL   RDW 11.4 (L) 11.5 - 15.5 %   Platelets 156 150 - 400 K/uL   Neutrophils Relative % 40 %   Neutro Abs 2.6 1.7 - 7.7 K/uL   Lymphocytes  Relative 43 %   Lymphs Abs 2.8 0.7 - 4.0 K/uL   Monocytes Relative 11 %   Monocytes Absolute 0.7 0.1 - 1.0 K/uL   Eosinophils Relative 5 %   Eosinophils Absolute 0.3 0.0 - 0.7 K/uL   Basophils Relative 1 %   Basophils Absolute 0.0 0.0 - 0.1 K/uL   Immature Granulocytes 0 %   Abs Immature Granulocytes 0.0 0.0 - 0.1 K/uL  Protime-INR  Result Value Ref Range   Prothrombin Time 14.1 11.4 - 15.2 seconds   INR 1.10     Chest 2 View  Result Date: 06/10/2018 CLINICAL DATA:  Pre op exam. Pt having Laminectomy and Foraminotomy - L1-L2 - L2-L3 - L3-L4 - L4-L5 on 06/20/18 EXAM: CHEST - 2 VIEW COMPARISON:  05/17/2018 FINDINGS: The heart size and mediastinal contours are within normal limits. Both lungs are clear. The visualized skeletal structures are unremarkable. Remote cervical fusion. IMPRESSION: No evidence for acute cardiopulmonary abnormality. Electronically Signed   By: Nolon Nations M.D.   On: 06/10/2018 14:38   Dg Lumbar Spine 2-3 Views  Result Date: 06/20/2018 CLINICAL DATA:  L1-L5 decompressive lumbar surgery EXAM: LUMBAR SPINE - 2-3 VIEW COMPARISON:  None. FINDINGS: Two intraoperative LATERAL views of the  lumbar spine are submitted postoperatively for interpretation. Film 1 demonstrates 2 posterior metallic probes, the UPPER probe between the between the L2 and L3 spinous processes and the LOWER probe overlying the LOWER aspect of the L4 spinous process. Film 2 demonstrates 2 posterior metallic probes, the UPPER probe between the L2 and L3 spinous processes directed at the LOWER aspect of L2 and the LOWER probe at the level of L4-5 posteriorly. IMPRESSION: Cytogeneticist Signed   By: Margarette Canada M.D.   On: 06/20/2018 15:06    Antibiotics:  Anti-infectives (From admission, onward)   Start     Dose/Rate Route Frequency Ordered Stop   06/20/18 2000  ceFAZolin (ANCEF) IVPB 2g/100 mL premix     2 g 200 mL/hr over 30 Minutes Intravenous Every 8 hours 06/20/18 1634 06/21/18 0354    06/20/18 1200  bacitracin 50,000 Units in sodium chloride 0.9 % 500 mL irrigation  Status:  Discontinued       As needed 06/20/18 1314 06/20/18 1401   06/20/18 0915  ceFAZolin (ANCEF) IVPB 2g/100 mL premix     2 g 200 mL/hr over 30 Minutes Intravenous On call to O.R. 06/20/18 0903 06/20/18 1215   06/20/18 0912  ceFAZolin (ANCEF) 2-4 GM/100ML-% IVPB    Note to Pharmacy:  Tamsen Snider   : cabinet override      06/20/18 0912 06/20/18 1215      Discharge Exam: Blood pressure 119/82, pulse 77, temperature 98.7 F (37.1 C), temperature source Oral, resp. rate 18, height 5\' 10"  (1.778 m), weight 95.8 kg, SpO2 98 %. Neurologic: Grossly normal dressing dry  Discharge Medications:   Allergies as of 06/21/2018      Reactions   Other Other (See Comments)   Nivaquine. Found in [place] and [place] for Malaria treatment UNSPECIFIED REACTION    Levofloxacin Other (See Comments)   Irritates gums      Medication List    TAKE these medications   aspirin EC 81 MG tablet Take 81 mg by mouth daily.   furosemide 20 MG tablet Commonly known as:  LASIX Take 1 tablet (20 mg total) by mouth daily as needed. What changed:  when to take this   HYDROcodone-acetaminophen 10-325 MG tablet Commonly known as:  NORCO Take 1 tablet by mouth every 6 (six) hours as needed for severe pain.   methocarbamol 500 MG tablet Commonly known as:  ROBAXIN Take 1 tablet (500 mg total) by mouth every 6 (six) hours as needed for muscle spasms.   rosuvastatin 20 MG tablet Commonly known as:  CRESTOR TAKE 1 TABLET(20 MG) BY MOUTH DAILY What changed:    how much to take  how to take this  when to take this  additional instructions   sildenafil 100 MG tablet Commonly known as:  VIAGRA Take 0.5-1 tablets (50-100 mg total) by mouth daily as needed for erectile dysfunction.            Durable Medical Equipment  (From admission, onward)         Start     Ordered   06/21/18 0950  For home use only  DME Walker  Once    Question:  Patient needs a walker to treat with the following condition  Answer:  Unsteady gait   06/21/18 0950          Disposition: home   Final Dx: LL for stenosis  Discharge Instructions    Call MD for:  persistant nausea and vomiting   Complete by:  As directed    Call MD for:  redness, tenderness, or signs of infection (pain, swelling, redness, odor or green/yellow discharge around incision site)   Complete by:  As directed    Call MD for:  severe uncontrolled pain   Complete by:  As directed    Call MD for:  temperature >100.4   Complete by:  As directed    Diet - low sodium heart healthy   Complete by:  As directed    Increase activity slowly   Complete by:  As directed    Remove dressing in 48 hours   Complete by:  As directed       Follow-up Information    Eustace Moore, MD. Schedule an appointment as soon as possible for a visit in 2 week(s).   Specialty:  Neurosurgery Contact information: [address] [place] [address] [phone]            Signed: Eustace Moore 06/21/2018, 1:46 PM

## 2018-06-22 DIAGNOSIS — Z79899 Other long term (current) drug therapy: Secondary | ICD-10-CM | POA: Diagnosis not present

## 2018-06-22 DIAGNOSIS — M48061 Spinal stenosis, lumbar region without neurogenic claudication: Secondary | ICD-10-CM | POA: Diagnosis not present

## 2018-06-22 DIAGNOSIS — E669 Obesity, unspecified: Secondary | ICD-10-CM | POA: Diagnosis not present

## 2018-06-22 DIAGNOSIS — Z6830 Body mass index (BMI) 30.0-30.9, adult: Secondary | ICD-10-CM | POA: Diagnosis not present

## 2018-06-22 DIAGNOSIS — E785 Hyperlipidemia, unspecified: Secondary | ICD-10-CM | POA: Diagnosis not present

## 2018-06-22 DIAGNOSIS — Z7982 Long term (current) use of aspirin: Secondary | ICD-10-CM | POA: Diagnosis not present

## 2018-06-22 MED ORDER — HYDROCODONE-ACETAMINOPHEN 7.5-325 MG PO TABS: 1.0000 | ORAL_TABLET | ORAL | 0 refills | Status: DC | PRN

## 2018-06-22 MED ORDER — METHOCARBAMOL 500 MG PO TABS: 500.0000 mg | ORAL_TABLET | Freq: Four times a day (QID) | ORAL | 0 refills | Status: DC | PRN

## 2018-06-22 NOTE — Plan of Care (Signed)
  Problem: Acute Rehab PT Goals(only PT should resolve) Goal: Pt Will Ambulate Outcome: Adequate for Discharge Goal: Pt Will Go Up/Down Stairs Outcome: Adequate for Discharge   Problem: Acute Rehab PT Goals(only PT should resolve) Goal: Patient Will Transfer Sit To/From Stand Outcome: Completed/Met   Problem: Acute Rehab PT Goals(only PT should resolve) Goal: Pt Will Verbalize and Adhere to Precautions While Description PT Will Verbalize and Adhere to Precautions While Performing Mobility Outcome: Not Met (add Reason)  Needs reinforcement of precautions

## 2018-06-22 NOTE — Progress Notes (Signed)
Patient alert and oriented, mae's well, voiding adequate amount of urine, swallowing without difficulty, no c/o pain at time of discharge. Patient discharged home with family. Script and discharged instructions given to patient. Patient and family stated understanding of instructions given. Patient has an appointment with Dr. Jones °

## 2018-06-22 NOTE — Progress Notes (Signed)
Subjective: Patient reports doing well  Objective: Vital signs in last 24 hours: Temp:  [97.9 F (36.6 C)-99.4 F (37.4 C)] 98.4 F (36.9 C) (10/12 0325) Pulse Rate:  [54-89] 56 (10/12 0325) Resp:  [16-20] 20 (10/12 0325) BP: (114-139)/(70-87) 122/70 (10/12 0325) SpO2:  [98 %-100 %] 100 % (10/12 0325)  Intake/Output from previous day: No intake/output data recorded. Intake/Output this shift: No intake/output data recorded.  Physical Exam: Full strength both legs.  Minor spotting on dressing.  Lab Results: No results for input(s): WBC, HGB, HCT, PLT in the last 72 hours. BMET No results for input(s): NA, K, CL, CO2, GLUCOSE, BUN, CREATININE, CALCIUM in the last 72 hours.  Studies/Results: Dg Lumbar Spine 2-3 Views  Result Date: 06/20/2018 CLINICAL DATA:  L1-L5 decompressive lumbar surgery EXAM: LUMBAR SPINE - 2-3 VIEW COMPARISON:  None. FINDINGS: Two intraoperative LATERAL views of the lumbar spine are submitted postoperatively for interpretation. Film 1 demonstrates 2 posterior metallic probes, the UPPER probe between the between the L2 and L3 spinous processes and the LOWER probe overlying the LOWER aspect of the L4 spinous process. Film 2 demonstrates 2 posterior metallic probes, the UPPER probe between the L2 and L3 spinous processes directed at the LOWER aspect of L2 and the LOWER probe at the level of L4-5 posteriorly. IMPRESSION: Cytogeneticist Signed   By: Margarette Canada M.D.   On: 06/20/2018 15:06    Assessment/Plan: Patient is doing well.  Discharge home.    LOS: 1 day    Peggyann Shoals, MD 06/22/2018, 6:38 AM

## 2018-06-22 NOTE — Progress Notes (Signed)
Physical Therapy Treatment Patient Details Name: Matthew Tucker MRN: [id number] DOB: 06/14/1951 Today's Date: 06/22/2018    History of Present Illness Patient is a 67 y/o male who presents s/p Decompressive lumbar laminectomy, medial facetectomy and foraminotomies L1-2 L2-3 L3-4 L4-5. PMH includes cervical surgery, HLD.    PT Comments    Patient progressing well with mobility and gait.  Able to ambulate with supervision with no assistive device on level surface.  Still recommend cane/RW for long distances and uneven ground, due to foot drop on right.  Patient required review of back precautions.  Feel patient safe for d/c home.    Follow Up Recommendations  Home health PT;Supervision - Intermittent     Equipment Recommendations  Rolling walker with 5" wheels(for long distances; outside ambulation)    Recommendations for Other Services       Precautions / Restrictions Precautions Precautions: Back;Fall Precaution Booklet Issued: Yes (comment) Precaution Comments: Reviewed back precautions and handout    Mobility  Bed Mobility Overal bed mobility: (sitting edge of bed upon arrival)                Transfers Overall transfer level: Modified independent Equipment used: None Transfers: Sit to/from Stand Sit to Stand: Modified independent (Device/Increase time)            Ambulation/Gait Ambulation/Gait assistance: Supervision Gait Distance (Feet): 300 Feet Assistive device: None Gait Pattern/deviations: Step-through pattern;Decreased step length - right     General Gait Details: steady gait; noticable foot drop on right, patient able to manage independently with no loss of balance.     Stairs             Wheelchair Mobility    Modified Rankin (Stroke Patients Only)       Balance Overall balance assessment: Mild deficits observed, not formally tested Sitting-balance support: Feet supported;No upper extremity supported Sitting balance-Leahy  Scale: Good     Standing balance support: No upper extremity supported Standing balance-Leahy Scale: Good Standing balance comment: better able to ambulate without UE support today;                             Cognition Arousal/Alertness: Awake/alert Behavior During Therapy: WFL for tasks assessed/performed Overall Cognitive Status: Within Functional Limits for tasks assessed                                        Exercises      General Comments        Pertinent Vitals/Pain Pain Assessment: No/denies pain    Home Living                      Prior Function            PT Goals (current goals can now be found in the care plan section) Progress towards PT goals: Progressing toward goals    Frequency    Min 5X/week      PT Plan Current plan remains appropriate    Co-evaluation              AM-PAC PT "6 Clicks" Daily Activity  Outcome Measure  Difficulty turning over in bed (including adjusting bedclothes, sheets and blankets)?: None Difficulty moving from lying on back to sitting on the side of the bed? : A Little Difficulty sitting down on and standing  up from a chair with arms (e.g., wheelchair, bedside commode, etc,.)?: A Little Help needed moving to and from a bed to chair (including a wheelchair)?: None Help needed walking in hospital room?: A Little Help needed climbing 3-5 steps with a railing? : A Little 6 Click Score: 20    End of Session   Activity Tolerance: Patient tolerated treatment well Patient left: (at nurses station talking with nurses) Nurse Communication: Mobility status PT Visit Diagnosis: Unsteadiness on feet (R26.81);Muscle weakness (generalized) (M62.81)     Time: 4098-1191 PT Time Calculation (min) (ACUTE ONLY): 16 min  Charges:  $Gait Training: 8-22 mins                     06/22/2018 Kendrick Ranch, PT Acute Rehabilitation Services Pager:  [phone] Office:   [phone]     Shanna Cisco 06/22/2018, 10:48 AM

## 2018-06-22 NOTE — Discharge Summary (Signed)
Physician Discharge Summary  Patient ID: Matthew Tucker MRN: [id number] DOB/AGE: 06-05-1951 67 y.o.  Admit date: 06/20/2018 Discharge date: 06/21/2018  Admission Diagnoses: lumbar stenosis       Discharge Diagnoses: same   Discharged Condition: good  Hospital Course: The patient was admitted on 06/20/2018 and taken to the operating room where the patient underwent LL L1-4. The patient tolerated the procedure well and was taken to the recovery room and then to the floor in stable condition. The hospital course was routine. There were no complications. The wound remained clean dry and intact. Pt had appropriate back soreness. No complaints of leg pain or new N/T/W. The patient remained afebrile with stable vital signs, and tolerated a regular diet. The patient continued to increase activities, and pain was well controlled with oral pain medications.   Consults: None  Significant Diagnostic Studies:       Results for orders placed or performed during the hospital encounter of 06/10/18  Surgical pcr screen  Result Value Ref Range   MRSA, PCR NEGATIVE NEGATIVE   Staphylococcus aureus NEGATIVE NEGATIVE  Basic metabolic panel  Result Value Ref Range   Sodium 138 135 - 145 mmol/L   Potassium 3.0 (L) 3.5 - 5.1 mmol/L   Chloride 100 98 - 111 mmol/L   CO2 23 22 - 32 mmol/L   Glucose, Bld 111 (H) 70 - 99 mg/dL   BUN 13 8 - 23 mg/dL   Creatinine, Ser 1.39 (H) 0.61 - 1.24 mg/dL   Calcium 9.0 8.9 - 10.3 mg/dL   GFR calc non Af Amer 50 (L) >60 mL/min   GFR calc Af Amer 58 (L) >60 mL/min   Anion gap 15 5 - 15  CBC WITH DIFFERENTIAL  Result Value Ref Range   WBC 6.5 4.0 - 10.5 K/uL   RBC 4.74 4.22 - 5.81 MIL/uL   Hemoglobin 14.3 13.0 - 17.0 g/dL   HCT 44.0 39.0 - 52.0 %   MCV 92.8 78.0 - 100.0 fL   MCH 30.2 26.0 - 34.0 pg   MCHC 32.5 30.0 - 36.0 g/dL   RDW 11.4 (L) 11.5 - 15.5 %   Platelets 156 150 - 400 K/uL   Neutrophils Relative % 40 %   Neutro  Abs 2.6 1.7 - 7.7 K/uL   Lymphocytes Relative 43 %   Lymphs Abs 2.8 0.7 - 4.0 K/uL   Monocytes Relative 11 %   Monocytes Absolute 0.7 0.1 - 1.0 K/uL   Eosinophils Relative 5 %   Eosinophils Absolute 0.3 0.0 - 0.7 K/uL   Basophils Relative 1 %   Basophils Absolute 0.0 0.0 - 0.1 K/uL   Immature Granulocytes 0 %   Abs Immature Granulocytes 0.0 0.0 - 0.1 K/uL  Protime-INR  Result Value Ref Range   Prothrombin Time 14.1 11.4 - 15.2 seconds   INR 1.10      ImagingResults  Chest 2 View  Result Date: 06/10/2018 CLINICAL DATA:  Pre op exam. Pt having Laminectomy and Foraminotomy - L1-L2 - L2-L3 - L3-L4 - L4-L5 on 06/20/18 EXAM: CHEST - 2 VIEW COMPARISON:  05/17/2018 FINDINGS: The heart size and mediastinal contours are within normal limits. Both lungs are clear. The visualized skeletal structures are unremarkable. Remote cervical fusion. IMPRESSION: No evidence for acute cardiopulmonary abnormality. Electronically Signed   By: Nolon Nations M.D.   On: 06/10/2018 14:38   Dg Lumbar Spine 2-3 Views  Result Date: 06/20/2018 CLINICAL DATA:  L1-L5 decompressive lumbar surgery EXAM: LUMBAR SPINE - 2-3  VIEW COMPARISON:  None. FINDINGS: Two intraoperative LATERAL views of the lumbar spine are submitted postoperatively for interpretation. Film 1 demonstrates 2 posterior metallic probes, the UPPER probe between the between the L2 and L3 spinous processes and the LOWER probe overlying the LOWER aspect of the L4 spinous process. Film 2 demonstrates 2 posterior metallic probes, the UPPER probe between the L2 and L3 spinous processes directed at the LOWER aspect of L2 and the LOWER probe at the level of L4-5 posteriorly. IMPRESSION: Cytogeneticist Signed   By: Margarette Canada M.D.   On: 06/20/2018 15:06     Antibiotics:             Anti-infectives (From admission, onward)   Start     Dose/Rate Route Frequency Ordered Stop   06/20/18 2000  ceFAZolin (ANCEF) IVPB 2g/100 mL  premix     2 g 200 mL/hr over 30 Minutes Intravenous Every 8 hours 06/20/18 1634 06/21/18 0354   06/20/18 1200  bacitracin 50,000 Units in sodium chloride 0.9 % 500 mL irrigation  Status:  Discontinued       As needed 06/20/18 1314 06/20/18 1401   06/20/18 0915  ceFAZolin (ANCEF) IVPB 2g/100 mL premix     2 g 200 mL/hr over 30 Minutes Intravenous On call to O.R. 06/20/18 0903 06/20/18 1215   06/20/18 0912  ceFAZolin (ANCEF) 2-4 GM/100ML-% IVPB    Note to Pharmacy:  Tamsen Snider   : cabinet override      06/20/18 0912 06/20/18 1215      Discharge Exam: Blood pressure 119/82, pulse 77, temperature 98.7 F (37.1 C), temperature source Oral, resp. rate 18, height 5\' 10"  (1.778 m), weight 95.8 kg, SpO2 98 %. Neurologic: Grossly normal dressing dry    Discharge Instructions    Call MD for:  persistant nausea and vomiting   Complete by:  As directed    Call MD for:  redness, tenderness, or signs of infection (pain, swelling, redness, odor or green/yellow discharge around incision site)   Complete by:  As directed    Call MD for:  severe uncontrolled pain   Complete by:  As directed    Call MD for:  temperature >100.4   Complete by:  As directed    Diet - low sodium heart healthy   Complete by:  As directed    Diet - low sodium heart healthy   Complete by:  As directed    Increase activity slowly   Complete by:  As directed    Increase activity slowly   Complete by:  As directed    Remove dressing in 48 hours   Complete by:  As directed      Allergies as of 06/22/2018      Reactions   Other Other (See Comments)   Nivaquine. Found in [place] and [place] for Malaria treatment UNSPECIFIED REACTION    Levofloxacin Other (See Comments)   Irritates gums      Medication List    TAKE these medications   aspirin EC 81 MG tablet Take 81 mg by mouth daily.   furosemide 20 MG tablet Commonly known as:  LASIX Take 1 tablet (20 mg total) by mouth daily as  needed. What changed:  when to take this   HYDROcodone-acetaminophen 10-325 MG tablet Commonly known as:  NORCO Take 1 tablet by mouth every 6 (six) hours as needed for severe pain. What changed:  Another medication with the same name was added. Make sure you understand how  and when to take each.   HYDROcodone-acetaminophen 7.5-325 MG tablet Commonly known as:  NORCO Take 1-2 tablets by mouth every 4 (four) hours as needed for moderate pain or severe pain. What changed:  You were already taking a medication with the same name, and this prescription was added. Make sure you understand how and when to take each.   methocarbamol 500 MG tablet Commonly known as:  ROBAXIN Take 1 tablet (500 mg total) by mouth every 6 (six) hours as needed for muscle spasms. What changed:  Another medication with the same name was added. Make sure you understand how and when to take each.   methocarbamol 500 MG tablet Commonly known as:  ROBAXIN Take 1 tablet (500 mg total) by mouth every 6 (six) hours as needed for muscle spasms. What changed:  You were already taking a medication with the same name, and this prescription was added. Make sure you understand how and when to take each.   rosuvastatin 20 MG tablet Commonly known as:  CRESTOR TAKE 1 TABLET(20 MG) BY MOUTH DAILY What changed:    how much to take  how to take this  when to take this  additional instructions   sildenafil 100 MG tablet Commonly known as:  VIAGRA Take 0.5-1 tablets (50-100 mg total) by mouth daily as needed for erectile dysfunction.            Durable Medical Equipment  (From admission, onward)         Start     Ordered   06/21/18 0950  For home use only DME Walker  Once    Question:  Patient needs a walker to treat with the following condition  Answer:  Unsteady gait   06/21/18 0950         Follow-up Information    Eustace Moore, MD. Schedule an appointment as soon as possible for a visit in 2 week(s).    Specialty:  Neurosurgery Contact information: [address] [address] [phone]        [facility], [facility] Follow up.   Specialty:  [place] Why:  A representative from [facility] will contact you to arrange start date and time for your therapy.  Contact information: [place] [address] [phone]           Signed: Peggyann Shoals, MD 06/22/2018, 6:38 AM

## 2018-06-24 DIAGNOSIS — Z981 Arthrodesis status: Secondary | ICD-10-CM | POA: Diagnosis not present

## 2018-06-24 DIAGNOSIS — M19042 Primary osteoarthritis, left hand: Secondary | ICD-10-CM | POA: Diagnosis not present

## 2018-06-24 DIAGNOSIS — M19041 Primary osteoarthritis, right hand: Secondary | ICD-10-CM | POA: Diagnosis not present

## 2018-06-24 DIAGNOSIS — Z4789 Encounter for other orthopedic aftercare: Secondary | ICD-10-CM | POA: Diagnosis not present

## 2018-06-24 DIAGNOSIS — E785 Hyperlipidemia, unspecified: Secondary | ICD-10-CM | POA: Diagnosis not present

## 2018-06-27 DIAGNOSIS — Z4789 Encounter for other orthopedic aftercare: Secondary | ICD-10-CM | POA: Diagnosis not present

## 2018-06-27 DIAGNOSIS — E785 Hyperlipidemia, unspecified: Secondary | ICD-10-CM | POA: Diagnosis not present

## 2018-06-27 DIAGNOSIS — M19041 Primary osteoarthritis, right hand: Secondary | ICD-10-CM | POA: Diagnosis not present

## 2018-06-27 DIAGNOSIS — Z981 Arthrodesis status: Secondary | ICD-10-CM | POA: Diagnosis not present

## 2018-06-27 DIAGNOSIS — M19042 Primary osteoarthritis, left hand: Secondary | ICD-10-CM | POA: Diagnosis not present

## 2018-07-02 DIAGNOSIS — E785 Hyperlipidemia, unspecified: Secondary | ICD-10-CM | POA: Diagnosis not present

## 2018-07-02 DIAGNOSIS — M19041 Primary osteoarthritis, right hand: Secondary | ICD-10-CM | POA: Diagnosis not present

## 2018-07-02 DIAGNOSIS — Z4789 Encounter for other orthopedic aftercare: Secondary | ICD-10-CM | POA: Diagnosis not present

## 2018-07-02 DIAGNOSIS — Z981 Arthrodesis status: Secondary | ICD-10-CM | POA: Diagnosis not present

## 2018-07-02 DIAGNOSIS — M19042 Primary osteoarthritis, left hand: Secondary | ICD-10-CM | POA: Diagnosis not present

## 2018-07-15 DIAGNOSIS — M19042 Primary osteoarthritis, left hand: Secondary | ICD-10-CM | POA: Diagnosis not present

## 2018-07-15 DIAGNOSIS — Z4789 Encounter for other orthopedic aftercare: Secondary | ICD-10-CM | POA: Diagnosis not present

## 2018-07-15 DIAGNOSIS — Z981 Arthrodesis status: Secondary | ICD-10-CM | POA: Diagnosis not present

## 2018-07-15 DIAGNOSIS — E785 Hyperlipidemia, unspecified: Secondary | ICD-10-CM | POA: Diagnosis not present

## 2018-07-15 DIAGNOSIS — M19041 Primary osteoarthritis, right hand: Secondary | ICD-10-CM | POA: Diagnosis not present

## 2018-07-18 DIAGNOSIS — E785 Hyperlipidemia, unspecified: Secondary | ICD-10-CM | POA: Diagnosis not present

## 2018-07-18 DIAGNOSIS — M19042 Primary osteoarthritis, left hand: Secondary | ICD-10-CM | POA: Diagnosis not present

## 2018-07-18 DIAGNOSIS — Z4789 Encounter for other orthopedic aftercare: Secondary | ICD-10-CM | POA: Diagnosis not present

## 2018-07-18 DIAGNOSIS — M19041 Primary osteoarthritis, right hand: Secondary | ICD-10-CM | POA: Diagnosis not present

## 2018-07-18 DIAGNOSIS — Z981 Arthrodesis status: Secondary | ICD-10-CM | POA: Diagnosis not present

## 2018-08-20 DIAGNOSIS — M48062 Spinal stenosis, lumbar region with neurogenic claudication: Secondary | ICD-10-CM | POA: Diagnosis not present

## 2018-08-20 DIAGNOSIS — M4802 Spinal stenosis, cervical region: Secondary | ICD-10-CM | POA: Diagnosis not present

## 2018-12-10 ENCOUNTER — Ambulatory Visit: Payer: Self-pay

## 2018-12-10 NOTE — Telephone Encounter (Signed)
Pt wife called to say that he was afraid to take his ibuprofen for his arthritis because it can worsen COVID-19 symptoms. Pt denies symptoms per wife. Pt was instructed that it is recommended that he take a his medication for arthritis as prescribed.  Pt wife verbalized understanding of instructions.  Answer Assessment - Initial Assessment Questions 1. REASON FOR CALL or QUESTION: "What is your reason for calling today?" or "How can I best help you?" or "What question do you have that I can help answer?"     Can my husband take his ibuprofen for his arthritis. The concern is that they say it could make COVID-19 symptoms worse. Pt denies symptoms.  Protocols used: INFORMATION ONLY CALL-A-AH

## 2019-02-20 ENCOUNTER — Other Ambulatory Visit (INDEPENDENT_AMBULATORY_CARE_PROVIDER_SITE_OTHER): Payer: Medicare Other

## 2019-02-20 ENCOUNTER — Telehealth: Payer: Self-pay

## 2019-02-20 ENCOUNTER — Ambulatory Visit (INDEPENDENT_AMBULATORY_CARE_PROVIDER_SITE_OTHER): Payer: Medicare Other | Admitting: Internal Medicine

## 2019-02-20 ENCOUNTER — Encounter: Payer: Self-pay | Admitting: Internal Medicine

## 2019-02-20 ENCOUNTER — Other Ambulatory Visit: Payer: Self-pay

## 2019-02-20 ENCOUNTER — Other Ambulatory Visit: Payer: Self-pay | Admitting: Internal Medicine

## 2019-02-20 VITALS — BP 124/86 | HR 84 | Temp 97.9°F | Ht 70.0 in | Wt 211.0 lb

## 2019-02-20 DIAGNOSIS — N320 Bladder-neck obstruction: Secondary | ICD-10-CM

## 2019-02-20 DIAGNOSIS — N183 Chronic kidney disease, stage 3 unspecified: Secondary | ICD-10-CM | POA: Insufficient documentation

## 2019-02-20 DIAGNOSIS — E611 Iron deficiency: Secondary | ICD-10-CM | POA: Diagnosis not present

## 2019-02-20 DIAGNOSIS — Z23 Encounter for immunization: Secondary | ICD-10-CM

## 2019-02-20 DIAGNOSIS — E785 Hyperlipidemia, unspecified: Secondary | ICD-10-CM | POA: Diagnosis not present

## 2019-02-20 DIAGNOSIS — E538 Deficiency of other specified B group vitamins: Secondary | ICD-10-CM | POA: Diagnosis not present

## 2019-02-20 DIAGNOSIS — R7302 Impaired glucose tolerance (oral): Secondary | ICD-10-CM

## 2019-02-20 DIAGNOSIS — M25649 Stiffness of unspecified hand, not elsewhere classified: Secondary | ICD-10-CM

## 2019-02-20 DIAGNOSIS — E559 Vitamin D deficiency, unspecified: Secondary | ICD-10-CM | POA: Diagnosis not present

## 2019-02-20 LAB — CBC WITH DIFFERENTIAL/PLATELET
Basophils Absolute: 0.1 10*3/uL (ref 0.0–0.1)
Basophils Relative: 0.9 % (ref 0.0–3.0)
Eosinophils Absolute: 0.1 10*3/uL (ref 0.0–0.7)
Eosinophils Relative: 2.5 % (ref 0.0–5.0)
HCT: 41.1 % (ref 39.0–52.0)
Hemoglobin: 13.7 g/dL (ref 13.0–17.0)
Lymphocytes Relative: 42.1 % (ref 12.0–46.0)
Lymphs Abs: 2.3 10*3/uL (ref 0.7–4.0)
MCHC: 33.4 g/dL (ref 30.0–36.0)
MCV: 91.4 fl (ref 78.0–100.0)
Monocytes Absolute: 0.5 10*3/uL (ref 0.1–1.0)
Monocytes Relative: 9.6 % (ref 3.0–12.0)
Neutro Abs: 2.4 10*3/uL (ref 1.4–7.7)
Neutrophils Relative %: 44.9 % (ref 43.0–77.0)
Platelets: 157 10*3/uL (ref 150.0–400.0)
RBC: 4.5 Mil/uL (ref 4.22–5.81)
RDW: 12.9 % (ref 11.5–15.5)
WBC: 5.5 10*3/uL (ref 4.0–10.5)

## 2019-02-20 LAB — VITAMIN B12: Vitamin B-12: 431 pg/mL (ref 211–911)

## 2019-02-20 LAB — URINALYSIS, ROUTINE W REFLEX MICROSCOPIC
Bilirubin Urine: NEGATIVE
Ketones, ur: NEGATIVE
Leukocytes,Ua: NEGATIVE
Nitrite: NEGATIVE
Specific Gravity, Urine: 1.02 (ref 1.000–1.030)
Total Protein, Urine: NEGATIVE
Urine Glucose: NEGATIVE
Urobilinogen, UA: 0.2 (ref 0.0–1.0)
pH: 5.5 (ref 5.0–8.0)

## 2019-02-20 LAB — IBC PANEL
Iron: 74 ug/dL (ref 42–165)
Saturation Ratios: 28.1 % (ref 20.0–50.0)
Transferrin: 188 mg/dL — ABNORMAL LOW (ref 212.0–360.0)

## 2019-02-20 LAB — HEPATIC FUNCTION PANEL
ALT: 15 U/L (ref 0–53)
AST: 21 U/L (ref 0–37)
Albumin: 4.4 g/dL (ref 3.5–5.2)
Alkaline Phosphatase: 81 U/L (ref 39–117)
Bilirubin, Direct: 0.2 mg/dL (ref 0.0–0.3)
Total Bilirubin: 0.9 mg/dL (ref 0.2–1.2)
Total Protein: 6.9 g/dL (ref 6.0–8.3)

## 2019-02-20 LAB — SEDIMENTATION RATE: Sed Rate: 9 mm/hr (ref 0–20)

## 2019-02-20 LAB — HEMOGLOBIN A1C: Hgb A1c MFr Bld: 5.4 % (ref 4.6–6.5)

## 2019-02-20 LAB — LIPID PANEL
Cholesterol: 158 mg/dL (ref 0–200)
HDL: 58 mg/dL (ref >39.00)
LDL Cholesterol: 88 mg/dL (ref 0–99)
NonHDL: 100.2
Total CHOL/HDL Ratio: 3
Triglycerides: 60 mg/dL (ref 0.0–149.0)
VLDL: 12 mg/dL (ref 0.0–40.0)

## 2019-02-20 LAB — VITAMIN D 25 HYDROXY (VIT D DEFICIENCY, FRACTURES): VITD: 17.44 ng/mL — ABNORMAL LOW (ref 30.00–100.00)

## 2019-02-20 LAB — BASIC METABOLIC PANEL
BUN: 18 mg/dL (ref 6–23)
CO2: 28 mEq/L (ref 19–32)
Calcium: 9.2 mg/dL (ref 8.4–10.5)
Chloride: 102 mEq/L (ref 96–112)
Creatinine, Ser: 1.33 mg/dL (ref 0.40–1.50)
GFR: 64.33 mL/min (ref >60.00)
Glucose, Bld: 124 mg/dL — ABNORMAL HIGH (ref 70–99)
Potassium: 3.1 mEq/L — ABNORMAL LOW (ref 3.5–5.1)
Sodium: 140 mEq/L (ref 135–145)

## 2019-02-20 LAB — C-REACTIVE PROTEIN: CRP: <1 mg/dL (ref 0.5–20.0)

## 2019-02-20 LAB — TSH: TSH: 2.79 u[IU]/mL (ref 0.35–4.50)

## 2019-02-20 LAB — PSA: PSA: 1.62 ng/mL (ref 0.10–4.00)

## 2019-02-20 MED ORDER — POTASSIUM CHLORIDE ER 10 MEQ PO TBCR: 10.0000 meq | EXTENDED_RELEASE_TABLET | Freq: Every day | ORAL | 3 refills | Status: DC

## 2019-02-20 MED ORDER — VITAMIN D (ERGOCALCIFEROL) 1.25 MG (50000 UNIT) PO CAPS: 50000.0000 [IU] | ORAL_CAPSULE | ORAL | 0 refills | Status: DC

## 2019-02-20 MED ORDER — IBUPROFEN 600 MG PO TABS: 600.0000 mg | ORAL_TABLET | Freq: Three times a day (TID) | ORAL | 2 refills | Status: DC | PRN

## 2019-02-20 NOTE — Assessment & Plan Note (Signed)
Also for r/o RA with labs as ordered,  to f/u any worsening symptoms or concerns

## 2019-02-20 NOTE — Assessment & Plan Note (Signed)
stable overall by history and exam, recent data reviewed with pt, and pt to continue medical treatment as before,  to f/u any worsening symptoms or concerns, for lipids with labs 

## 2019-02-20 NOTE — Telephone Encounter (Signed)
Pt has been informed of results and expressed understanding.  °

## 2019-02-20 NOTE — Patient Instructions (Addendum)
You had the Tdap tetanus shot today  Please take all new medication as prescribed - the ibuprofen as needed  Please continue all other medications as before, and refills have been done if requested.  Please have the pharmacy call with any other refills you may need.  Please continue your efforts at being more active, low cholesterol diet, and weight control.  You are otherwise up to date with prevention measures today.  Please keep your appointments with your specialists as you may have planned  Please go to the LAB in the Basement (turn left off the elevator) for the tests to be done today  You will be contacted by phone if any changes need to be made immediately.  Otherwise, you will receive a letter about your results with an explanation, but please check with MyChart first.  Please remember to sign up for MyChart if you have not done so, as this will be important to you in the future with finding out test results, communicating by private email, and scheduling acute appointments online when needed.  Please return in 6 months, or sooner if needed

## 2019-02-20 NOTE — Assessment & Plan Note (Signed)
stable overall by history and exam, recent data reviewed with pt, and pt to continue medical treatment as before,  to f/u any worsening symptoms or concerns  

## 2019-02-20 NOTE — Assessment & Plan Note (Signed)
stable overall by history and exam, recent data reviewed with pt, and pt to continue medical treatment as before,  to f/u any worsening symptoms or concerns, for a1c with labs 

## 2019-02-20 NOTE — Progress Notes (Signed)
Subjective:    Patient ID: Matthew Tucker, male    DOB: [birth year], 68 y.o.   MRN: [id number]  HPI  Here for yearly f/u;  Overall doing ok;  Pt denies Chest pain, worsening SOB, DOE, wheezing, orthopnea, PND, worsening LE edema, palpitations, dizziness or syncope.  Pt denies neurological change such as new headache, facial or extremity weakness.  Pt denies polydipsia, polyuria, or low sugar symptoms. Pt states overall good compliance with treatment and medications, good tolerability, and has been trying to follow appropriate diet.  Pt denies worsening depressive symptoms, suicidal ideation or panic. No fever, night sweats, wt loss, loss of appetite, or other constitutional symptoms.  Pt states good ability with ADL's, has low fall risk, home safety reviewed and adequate, no other significant changes in hearing or vision, and only occasionally active with exercise.  Does have AM hand pain and stiffness, worse in the AM, associated with intermittent swelling for several months. Past Medical History:  Diagnosis Date  . Arthritis    Neck, bilateral hands  . BACK PAIN, CHRONIC 10/31/2010  . Cervical stenosis of spine   . Chronic lumbar radiculopathy 05/20/2015  . Diastolic dysfunction 5/63/1497  . Family history of colon cancer 02/13/13   Father died at 65yo  . HYPERLIPIDEMIA 09/02/2007  . Impaired glucose tolerance 08/29/2011  . PARESTHESIA 09/02/2007  . Toxic effect of chlorine gas(987.6) 10/31/2010   Past Surgical History:  Procedure Laterality Date  . BUNIONECTOMY    . COLONOSCOPY    . FOOT SURGERY Right   . LUMBAR LAMINECTOMY/DECOMPRESSION MICRODISCECTOMY N/A 06/20/2018   Procedure: Laminectomy and Foraminotomy - Lumbar one-Lumbar two - Lumbar two-Lumbar three - Lumbar three-Lumbar four - Lumbar four-Lumbar five;  Surgeon: Eustace Moore, MD;  Location: [place];  Service: Neurosurgery;  Laterality: N/A;  . mass removal     back, forehead; benign (lipoma)   . mass removal  2011   head;  benign  . POSTERIOR CERVICAL FUSION/FORAMINOTOMY N/A 01/03/2018   Procedure: Posterior Cervical Fusion with lateral mass fixation - Cervical three - Cervical seven, cervical laminectomy Cervical three-cervical seven;  Surgeon: Eustace Moore, MD;  Location: [place];  Service: Neurosurgery;  Laterality: N/A;  . ROOT CANAL    . s/p lipoma right scalp posteriorly  2011    reports that he has never smoked. He has never used smokeless tobacco. He reports current alcohol use. He reports that he does not use drugs. family history includes Cancer in his mother; Colon cancer (age of onset: 73) in his father; Stroke (age of onset: 58) in his brother. Allergies  Allergen Reactions  . Other Other (See Comments)    Nivaquine. Found in [place] and [place] for Malaria treatment  UNSPECIFIED REACTION   . Levofloxacin Other (See Comments)    Irritates gums   Current Outpatient Medications on File Prior to Visit  Medication Sig Dispense Refill  . aspirin EC 81 MG tablet Take 81 mg by mouth daily.    . furosemide (LASIX) 20 MG tablet Take 1 tablet (20 mg total) by mouth daily as needed. (Patient taking differently: Take 20 mg by mouth daily. ) 90 tablet 3  . methocarbamol (ROBAXIN) 500 MG tablet Take 1 tablet (500 mg total) by mouth every 6 (six) hours as needed for muscle spasms. 60 tablet 0  . rosuvastatin (CRESTOR) 20 MG tablet TAKE 1 TABLET(20 MG) BY MOUTH DAILY (Patient taking differently: Take 20 mg by mouth daily. ) 90 tablet 3  . sildenafil (VIAGRA)  100 MG tablet Take 0.5-1 tablets (50-100 mg total) by mouth daily as needed for erectile dysfunction. 10 tablet 11   No current facility-administered medications on file prior to visit.    Review of Systems  Constitutional: Negative for other unusual diaphoresis or sweats HENT: Negative for ear discharge or swelling Eyes: Negative for other worsening visual disturbances Respiratory: Negative for stridor or other swelling  Gastrointestinal: Negative for  worsening distension or other blood Genitourinary: Negative for retention or other urinary change Musculoskeletal: Negative for other MSK pain or swelling Skin: Negative for color change or other new lesions Neurological: Negative for worsening tremors and other numbness  Psychiatric/Behavioral: Negative for worsening agitation or other fatigue All other system neg per pt    Objective:   Physical Exam BP 124/86   Pulse 84   Temp 97.9 F (36.6 C) (Oral)   Ht 5\' 10"  (1.778 m)   Wt 211 lb (95.7 kg)   SpO2 96%   BMI 30.28 kg/m  VS noted,  Constitutional: Pt appears in NAD HENT: Head: NCAT.  Right Ear: External ear normal.  Left Ear: External ear normal.  Eyes: . Pupils are equal, round, and reactive to light. Conjunctivae and EOM are normal Nose: without d/c or deformity Neck: Neck supple. Gross normal ROM Cardiovascular: Normal rate and regular rhythm.   Pulmonary/Chest: Effort normal and breath sounds without rales or wheezing.  Abd:  Soft, NT, ND, + BS, no organomegaly bilat hands with OA changes and no swelling Neurological: Pt is alert. At baseline orientation, motor grossly intact Skin: Skin is warm. No rashes, other new lesions, no LE edema Psychiatric: Pt behavior is normal without agitation  No other exam findings    Lab Results  Component Value Date   WBC 6.5 06/10/2018   HGB 14.3 06/10/2018   HCT 44.0 06/10/2018   PLT 156 06/10/2018   GLUCOSE 111 (H) 06/10/2018   CHOL 189 02/15/2018   TRIG 69.0 02/15/2018   HDL 60.90 02/15/2018   LDLDIRECT 147.4 01/10/2013   LDLCALC 114 (H) 02/15/2018   ALT 17 05/17/2018   AST 21 05/17/2018   NA 138 06/10/2018   K 3.0 (L) 06/10/2018   CL 100 06/10/2018   CREATININE 1.39 (H) 06/10/2018   BUN 13 06/10/2018   CO2 23 06/10/2018   TSH 3.20 05/17/2018   PSA 1.51 05/17/2018   INR 1.10 06/10/2018   HGBA1C 5.4 02/15/2018        Assessment & Plan:

## 2019-02-20 NOTE — Telephone Encounter (Signed)
-----   Message from Biagio Borg, MD sent at 02/20/2019 12:33 PM EDT ----- Left message on MyChart, pt to cont same tx except  The test results show that your current treatment is OK, except the potassium is low (most likely due to the fluid pill), and the Vitamin D level is low.  We need to: 10 Please take Vitamin D 50000 units weekly for 12 weeks, then plan to change to OTC Vitamin D3 at 2000 units per day, indefinitely. 2)  Take a potassium medication - 1 per day    Regis Wiland to please inform pt, I will do rx x 2

## 2019-02-21 ENCOUNTER — Other Ambulatory Visit: Payer: Self-pay | Admitting: Internal Medicine

## 2019-02-21 DIAGNOSIS — M069 Rheumatoid arthritis, unspecified: Secondary | ICD-10-CM

## 2019-02-22 LAB — ANA: Anti Nuclear Antibody (ANA): NEGATIVE

## 2019-02-22 LAB — CYCLIC CITRUL PEPTIDE ANTIBODY, IGG: Cyclic Citrullin Peptide Ab: 116 UNITS — ABNORMAL HIGH

## 2019-02-25 ENCOUNTER — Telehealth: Payer: Self-pay

## 2019-02-25 NOTE — Telephone Encounter (Signed)
-----   Message from Biagio Borg, MD sent at 02/21/2019  3:14 PM EDT ----- Left message on MyChart, pt to cont same tx except  The test results show that your current treatment is OK, except this test is high, meaning that you have a high likelihood for having Rheumatoid Arthritis.  We will refer you to Rheumatology for further consideration and treatment if needed.Redmond Baseman to please inform pt, I will do referral

## 2019-02-25 NOTE — Telephone Encounter (Signed)
Pt has been informed of results and expressed understanding.  °

## 2019-03-04 DIAGNOSIS — M1990 Unspecified osteoarthritis, unspecified site: Secondary | ICD-10-CM | POA: Diagnosis not present

## 2019-03-04 DIAGNOSIS — M2550 Pain in unspecified joint: Secondary | ICD-10-CM | POA: Diagnosis not present

## 2019-03-04 DIAGNOSIS — M189 Osteoarthritis of first carpometacarpal joint, unspecified: Secondary | ICD-10-CM | POA: Diagnosis not present

## 2019-03-04 DIAGNOSIS — R789 Finding of unspecified substance, not normally found in blood: Secondary | ICD-10-CM | POA: Diagnosis not present

## 2019-03-04 DIAGNOSIS — M79641 Pain in right hand: Secondary | ICD-10-CM | POA: Diagnosis not present

## 2019-03-04 DIAGNOSIS — E785 Hyperlipidemia, unspecified: Secondary | ICD-10-CM | POA: Diagnosis not present

## 2019-03-04 DIAGNOSIS — M5136 Other intervertebral disc degeneration, lumbar region: Secondary | ICD-10-CM | POA: Diagnosis not present

## 2019-03-04 DIAGNOSIS — M79642 Pain in left hand: Secondary | ICD-10-CM | POA: Diagnosis not present

## 2019-03-04 DIAGNOSIS — M2560 Stiffness of unspecified joint, not elsewhere classified: Secondary | ICD-10-CM | POA: Diagnosis not present

## 2019-03-04 DIAGNOSIS — M5030 Other cervical disc degeneration, unspecified cervical region: Secondary | ICD-10-CM | POA: Diagnosis not present

## 2019-03-28 ENCOUNTER — Other Ambulatory Visit: Payer: Self-pay | Admitting: Internal Medicine

## 2019-04-03 IMAGING — XA DG MYELOGRAPHY LUMBAR INJ MULTI REGION
12 of 22 series · 12 of 22 positions shown · non-contrast
Comparison: none

CLINICAL DATA: Neck pain.  Back pain.  Gait disturbance.
TECHNIQUE: Contiguous axial images were obtained through the Cervical and
Thoracic spine after the intrathecal infusion of infusion. Coronal
and sagittal reconstructions were obtained of the axial image sets.

[Series 2: vasc standard · 1 of 1 slices shown (1 of 12)]
[im 1/1]
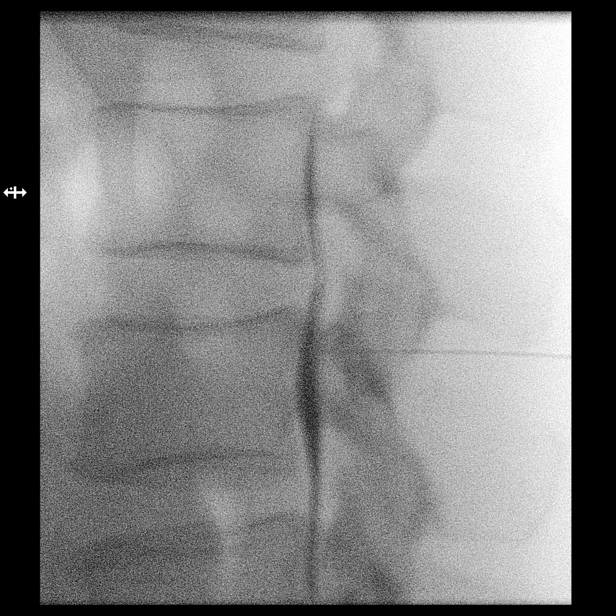

[Series 4: vasc standard · 1 of 1 slices shown (2 of 12)]
[im 1/1]
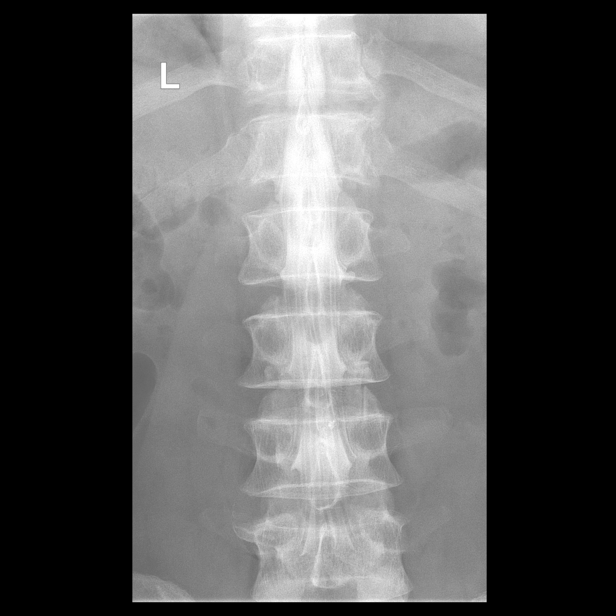

[Series 6: vasc standard · 1 of 1 slices shown (3 of 12)]
[im 1/1]
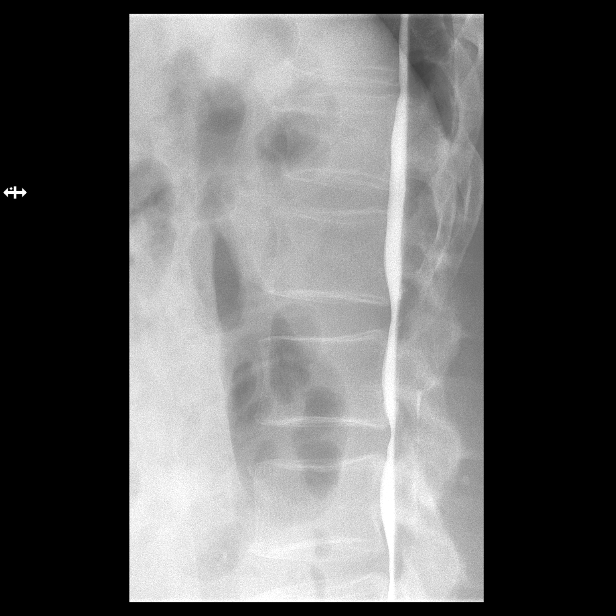

[Series 8: vasc standard · 1 of 1 slices shown (4 of 12)]
[im 1/1]
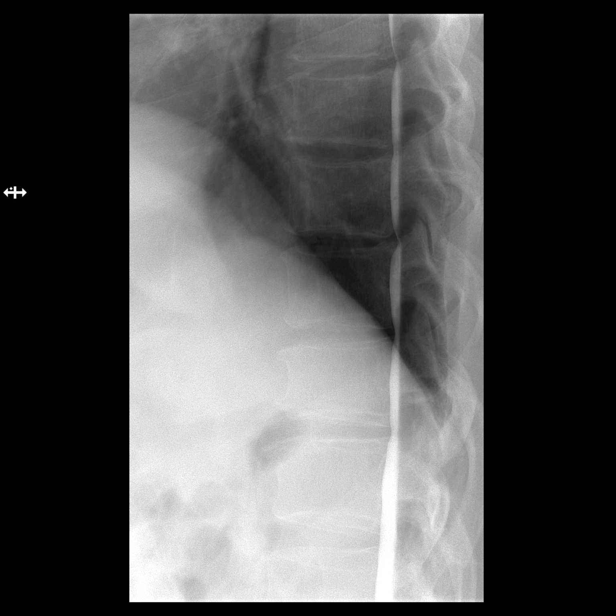

[Series 10: vasc standard · 1 of 1 slices shown (5 of 12)]
[im 1/1]
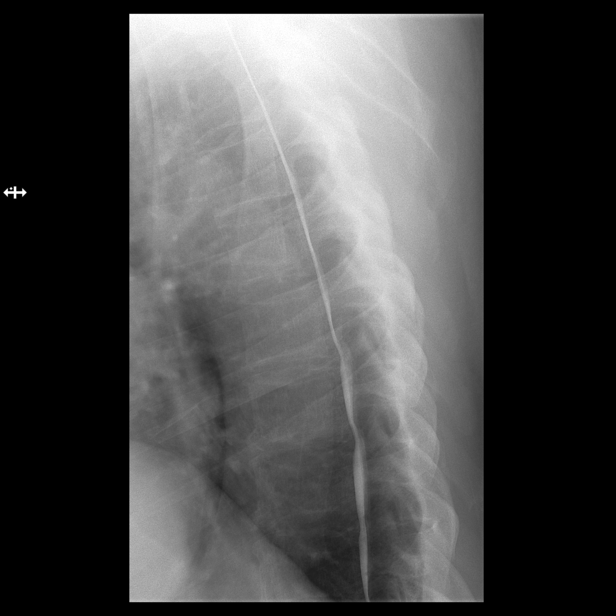

[Series 12: vasc standard · 1 of 1 slices shown (6 of 12)]
[im 1/1]
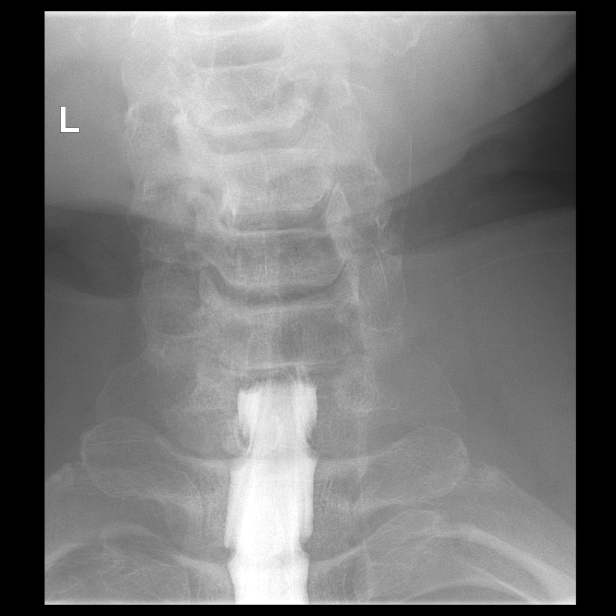

[Series 13: vasc standard · 1 of 1 slices shown (7 of 12)]
[im 1/1]
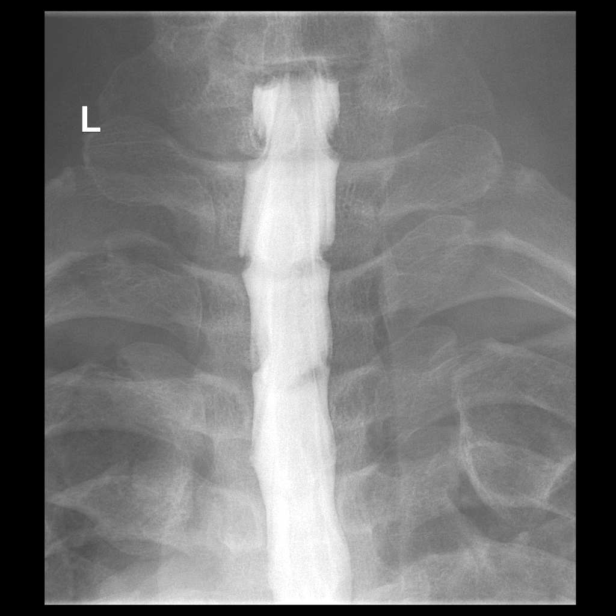

[Series 15: vasc standard · 1 of 1 slices shown (8 of 12)]
[im 1/1]
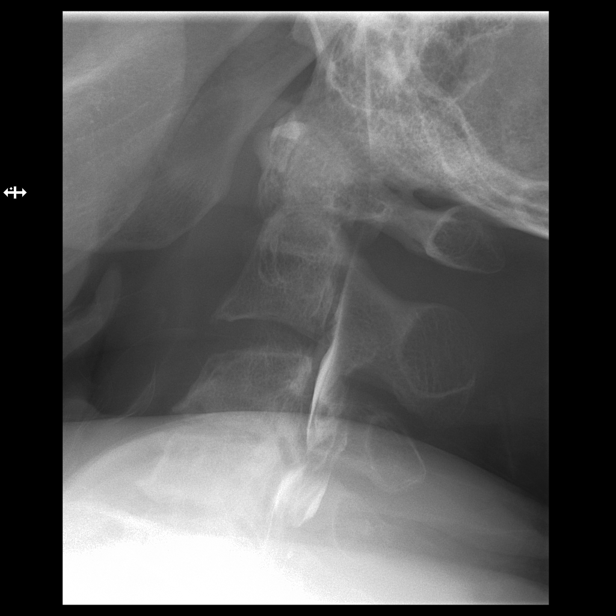

[Series 17: vasc standard · 1 of 1 slices shown (9 of 12)]
[im 1/1]
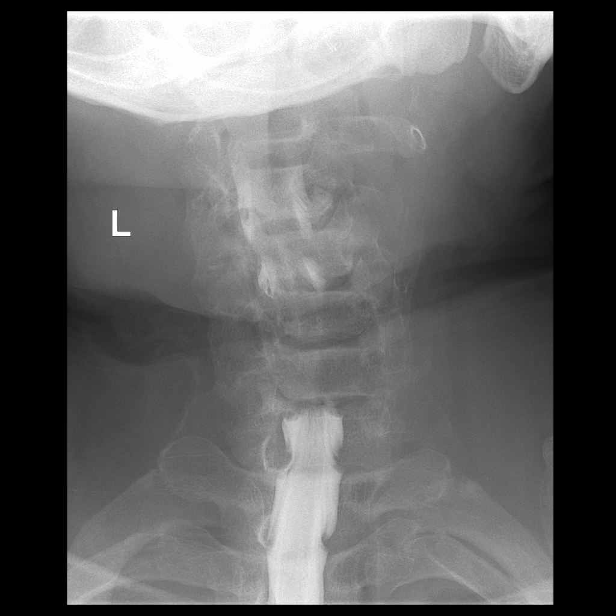

[Series 19: vasc standard · 1 of 1 slices shown (10 of 12)]
[im 1/1]
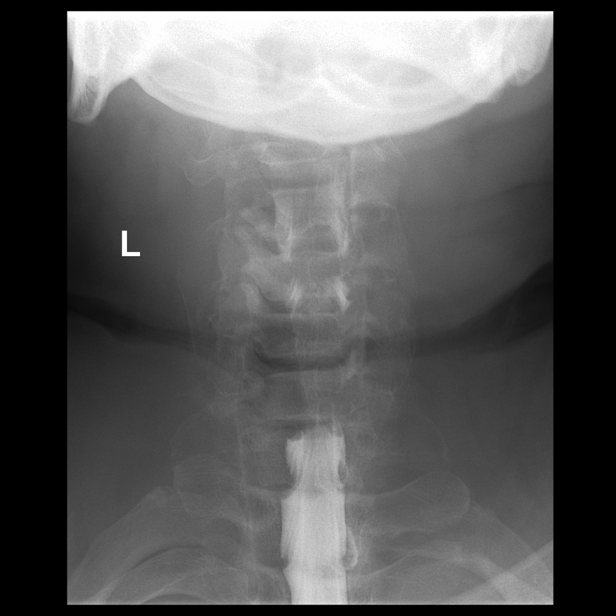

[Series 21: vasc standard · 1 of 1 slices shown (11 of 12)]
[im 1/1]
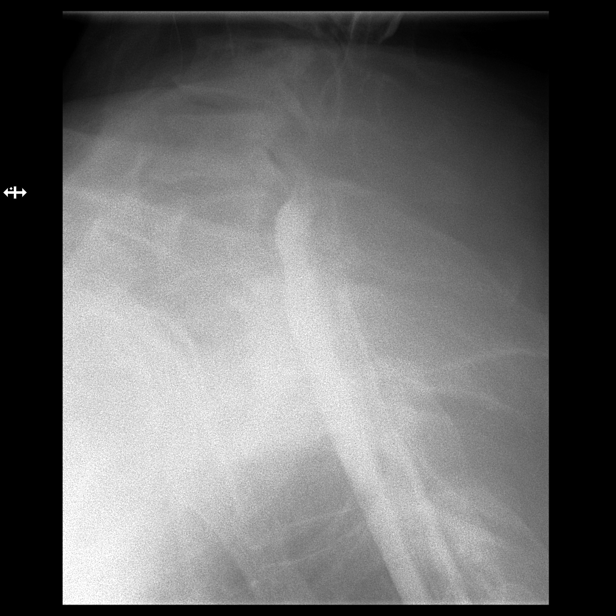

[Series 23: vasc standard · 1 of 1 slices shown (12 of 12)]
[im 1/1]
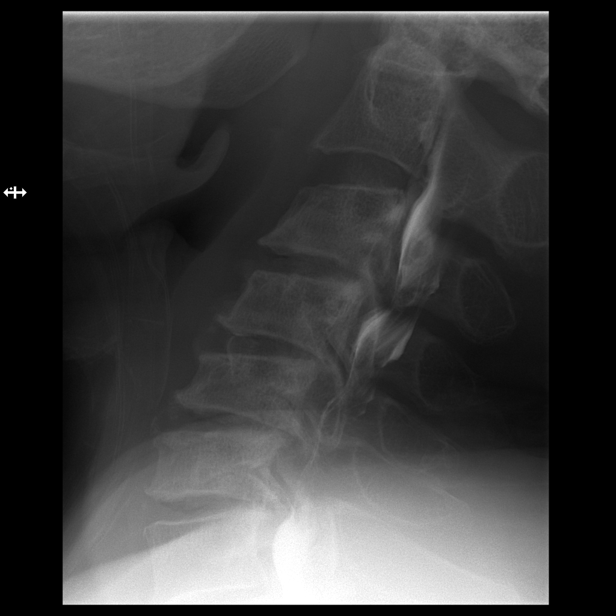

[12 of 22 positions shown; findings below may reference images not displayed]

FLUOROSCOPY TIME:  dictate in minutes and seconds

PROCEDURE:
LUMBAR PUNCTURE FOR CERVICAL AND THORACIC MYELOGRAM

After thorough discussion of risks and benefits of the procedure
including bleeding, infection, injury to nerves, blood vessels,
adjacent structures as well as headache and CSF leak, written and
oral informed consent was obtained. Consent was obtained by Dr. Lipon
Munger.

Patient was positioned prone on the fluoroscopy table. Local
anesthesia was provided with 1% lidocaine without epinephrine after
prepped and draped in the usual sterile fashion. Puncture was
performed at L3 using a 3 1/2 inch 22-gauge spinal needle via
midline approach. Using a single pass through the dura, the needle
was placed within the thecal sac, with return of clear CSF. 10 mL of
Isovue C-R44 was injected into the thecal sac, with normal
opacification of the nerve roots and cauda equina consistent with
free flow within the subarachnoid space. The patient was then moved
to the trendelenburg position and contrast flowed into the Thoracic
spine region.

I personally performed the lumbar puncture and administered the
intrathecal contrast. I also personally supervised acquisition of
the myelogram images.
FINDINGS: CERVICAL AND THORACIC MYELOGRAM FINDINGS:

CERVICAL: Near complete block at C6-7. Severe stenosis C5-6 and
C6-7. Moderate to severe stenosis C3-4 and C4-5. Reversal of the
normal cervical lordotic curve.

THORACIC: Congenital narrowing of the spinal canal. Ventral defects
are seen at T6-7, T7-8, T9-10, and T10-11.

CT CERVICAL MYELOGRAM FINDINGS:

Alignment: Reversal of the normal cervical lordotic curve. Trace
anterolisthesis and retrolisthesis reflects this altered curvature.

Vertebrae: No worrisome osseous lesion. Congenital short pedicles
C3-C7.

Cord: Multilevel cord flattening, C3-C7.

Posterior Fossa: No tonsillar herniation.

Vertebral Arteries: Not assessed.

Paraspinal tissues: Minor atheromatous change.  No lung lesions.

Disc levels:

C2-3:  Annular bulge.  No impingement.

C3-4: Central protrusion. Uncinate spurring. Short pedicles. Disk
space narrowing. Stenosis with cord flattening. BILATERAL C4
foraminal narrowing.

C4-5: Disk space narrowing. Central protrusion with osseous
spurring. Short pedicles. Stenosis with cord flattening. Contrast is
seen within the cord either in the central canal or in cystic
myelomalacia. BILATERAL C5 foraminal narrowing, worse on the RIGHT.

C5-6: Disc space narrowing. Central protrusion with osseous
spurring. Short pedicles. Severe stenosis with cord flattening.
Contrast is seen within the cord, either in the central canal or in
cystic myelomalacia. BILATERAL C6 foraminal narrowing.

C6-7: Disc space narrowing. Central protrusion with osseous
spurring. Short pedicles. Severe stenosis with cord flattening.
Contrast is seen within the cord, either in the central canal or in
cystic myelomalacia. BILATERAL C7 foraminal narrowing, worse on the
LEFT.

C7-T1: The disc is unremarkable. There is no stenosis. BILATERAL
facet arthropathy. No definite C8 foraminal narrowing.

CT THORACIC MYELOGRAM FINDINGS:

Alignment: Anatomic

Vertebrae: No worrisome osseous lesion. There is a degree of
congenital stenosis throughout the thoracic spine.

Cord: No intrinsic abnormality or significant compression.

Paraspinal tissues: Unremarkable.  No lung lesions.

Disc levels:

The individual disc spaces are examined as follows:

T1-2:  Unremarkable.

T2-3: Rightward protrusion. No cord compression. Possible RIGHT T2
foraminal narrowing.

T3-4: Unremarkable disc space. Minor ligamentum flavum
calcification. No cord compression.

T4-5:  Unremarkable.

T5-6:  Shallow leftward protrusion.  No impingement.

T6-7:  Central protrusion.  Mild stenosis.  No impingement.

T7-8: Central protrusion. Mild stenosis. Slight dorsal displacement
of the cord without compression.

T8-9:  Annular bulge.  Schmorl's nodes.  No impingement.

T9-10: Central disc extrusion. Mild stenosis is compounded by
posterior element hypertrophy. Slight cord deformity.

T10-11: Shallow central protrusion. Posterior element hypertrophy
affecting facets and ligamentum flavum. Mild stenosis without cord
compression. BILATERAL foraminal narrowing is possible.

T11-12: Annular bulge. Posterior element hypertrophy. No definite
impingement.

T12-L1: Unremarkable.

Correlation with prior MR reveals good general agreement. No
specific abnormality within the cord is seen to correlate with the
MR appearance of signal abnormality at T11-12 suggesting chronic
myelomalacia.
IMPRESSION: Moderate to severe multilevel spinal stenosis in the cervical
region, C3 through C7. These changes are most severe at C5-6 and
C6-7 related to a combination of loss of interspace height, bony
overgrowth, short pedicles, and disc material. Reversal of the
normal cervical lordotic curve.

Cord compression at multiple levels, with abnormal penetration of
contrast into the cord suggesting central canal accumulation or
contrast within cystic myelomalacia. Consider correlation with
cervical spine MRI.

Multiple thoracic disc protrusions without significant cord
compression. Mild congenital and acquired stenosis due to short
pedicles and posterior element hypertrophy. No correlate is seen for
the area of abnormal signal on thoracic MRI.

## 2019-05-26 ENCOUNTER — Telehealth: Payer: Self-pay | Admitting: Internal Medicine

## 2019-05-26 MED ORDER — FUROSEMIDE 20 MG PO TABS: 20.0000 mg | ORAL_TABLET | Freq: Every day | ORAL | 0 refills | Status: DC | PRN

## 2019-05-26 NOTE — Telephone Encounter (Signed)
Medication:  furosemide (LASIX) 20 MG tablet [id number]  Pharmacy:    [facility] [place], [place] AT [address] [phone] (Phone) [phone] (Fax)

## 2019-05-26 NOTE — Addendum Note (Signed)
Addended by: Juliet Rude on: 05/26/2019 11:20 AM   Modules accepted: Orders

## 2019-06-10 DIAGNOSIS — Z23 Encounter for immunization: Secondary | ICD-10-CM | POA: Diagnosis not present

## 2019-06-11 ENCOUNTER — Telehealth: Payer: Self-pay

## 2019-06-11 NOTE — Telephone Encounter (Signed)
Shot has been documented  Copied from [place] [id number]. Topic: General - Other >> Jun 11, 2019 12:22 PM Keene Breath wrote: Reason for CRM: Patient called to inform the office that he got his flu shot yesterday from [facility]

## 2019-06-18 DIAGNOSIS — M1990 Unspecified osteoarthritis, unspecified site: Secondary | ICD-10-CM | POA: Diagnosis not present

## 2019-06-18 DIAGNOSIS — E785 Hyperlipidemia, unspecified: Secondary | ICD-10-CM | POA: Diagnosis not present

## 2019-06-18 DIAGNOSIS — M5136 Other intervertebral disc degeneration, lumbar region: Secondary | ICD-10-CM | POA: Diagnosis not present

## 2019-06-18 DIAGNOSIS — M2550 Pain in unspecified joint: Secondary | ICD-10-CM | POA: Diagnosis not present

## 2019-06-18 DIAGNOSIS — M2560 Stiffness of unspecified joint, not elsewhere classified: Secondary | ICD-10-CM | POA: Diagnosis not present

## 2019-06-18 DIAGNOSIS — M5030 Other cervical disc degeneration, unspecified cervical region: Secondary | ICD-10-CM | POA: Diagnosis not present

## 2019-06-18 DIAGNOSIS — R789 Finding of unspecified substance, not normally found in blood: Secondary | ICD-10-CM | POA: Diagnosis not present

## 2019-08-18 ENCOUNTER — Encounter: Payer: Self-pay | Admitting: Internal Medicine

## 2019-08-22 ENCOUNTER — Other Ambulatory Visit: Payer: Self-pay

## 2019-08-22 ENCOUNTER — Ambulatory Visit (INDEPENDENT_AMBULATORY_CARE_PROVIDER_SITE_OTHER): Payer: Medicare Other | Admitting: Internal Medicine

## 2019-08-22 ENCOUNTER — Encounter: Payer: Self-pay | Admitting: Internal Medicine

## 2019-08-22 ENCOUNTER — Other Ambulatory Visit (INDEPENDENT_AMBULATORY_CARE_PROVIDER_SITE_OTHER): Payer: Medicare Other

## 2019-08-22 VITALS — BP 122/78 | HR 85 | Temp 98.3°F | Ht 70.0 in | Wt 216.0 lb

## 2019-08-22 DIAGNOSIS — N183 Chronic kidney disease, stage 3 unspecified: Secondary | ICD-10-CM

## 2019-08-22 DIAGNOSIS — E876 Hypokalemia: Secondary | ICD-10-CM | POA: Diagnosis not present

## 2019-08-22 DIAGNOSIS — E785 Hyperlipidemia, unspecified: Secondary | ICD-10-CM

## 2019-08-22 DIAGNOSIS — E559 Vitamin D deficiency, unspecified: Secondary | ICD-10-CM

## 2019-08-22 DIAGNOSIS — R7302 Impaired glucose tolerance (oral): Secondary | ICD-10-CM

## 2019-08-22 DIAGNOSIS — M2550 Pain in unspecified joint: Secondary | ICD-10-CM | POA: Diagnosis not present

## 2019-08-22 LAB — BASIC METABOLIC PANEL
BUN: 19 mg/dL (ref 6–23)
CO2: 28 mEq/L (ref 19–32)
Calcium: 9.3 mg/dL (ref 8.4–10.5)
Chloride: 103 mEq/L (ref 96–112)
Creatinine, Ser: 1.32 mg/dL (ref 0.40–1.50)
GFR: 64.8 mL/min (ref >60.00)
Glucose, Bld: 108 mg/dL — ABNORMAL HIGH (ref 70–99)
Potassium: 3.3 mEq/L — ABNORMAL LOW (ref 3.5–5.1)
Sodium: 140 mEq/L (ref 135–145)

## 2019-08-22 LAB — VITAMIN D 25 HYDROXY (VIT D DEFICIENCY, FRACTURES): VITD: 25.04 ng/mL — ABNORMAL LOW (ref 30.00–100.00)

## 2019-08-22 LAB — HEMOGLOBIN A1C: Hgb A1c MFr Bld: 5.4 % (ref 4.6–6.5)

## 2019-08-22 NOTE — Progress Notes (Signed)
Subjective:    Patient ID: Matthew Tucker, male    DOB: December 25, 1950, 68 y.o.   MRN: [id number]  HPI   Here to f/u; overall doing ok,  Pt denies chest pain, increasing sob or doe, wheezing, orthopnea, PND, increased LE swelling, palpitations, dizziness or syncope.  Pt denies new neurological symptoms such as new headache, or facial or extremity weakness or numbness.  Pt denies polydipsia, polyuria, or low sugar episode.  Pt states overall good compliance with meds, mostly trying to follow appropriate diet, with wt overall stable,  but little exercise however.   Has seen rheum with dx arthralgia with anti-CCP positive, but no definite dx of RA, and pain overall improved.  Has f/u soon.  Did take the Vitamin D 50k per wk but not yet started daily.     Past Medical History:  Diagnosis Date  . Arthritis    Neck, bilateral hands  . BACK PAIN, CHRONIC 10/31/2010  . Cervical stenosis of spine   . Chronic lumbar radiculopathy 05/20/2015  . Diastolic dysfunction 0000000  . Family history of colon cancer 01-24-13   Father died at 18yo  . HYPERLIPIDEMIA 09/02/2007  . Impaired glucose tolerance 08/29/2011  . PARESTHESIA 09/02/2007  . Toxic effect of chlorine gas(987.6) 10/31/2010   Past Surgical History:  Procedure Laterality Date  . BUNIONECTOMY    . COLONOSCOPY    . FOOT SURGERY Right   . LUMBAR LAMINECTOMY/DECOMPRESSION MICRODISCECTOMY N/A 06/20/2018   Procedure: Laminectomy and Foraminotomy - Lumbar one-Lumbar two - Lumbar two-Lumbar three - Lumbar three-Lumbar four - Lumbar four-Lumbar five;  Surgeon: Eustace Moore, MD;  Location: [place];  Service: Neurosurgery;  Laterality: N/A;  . mass removal     back, forehead; benign (lipoma)   . mass removal  2011   head; benign  . POSTERIOR CERVICAL FUSION/FORAMINOTOMY N/A 01/03/2018   Procedure: Posterior Cervical Fusion with lateral mass fixation - Cervical three - Cervical seven, cervical laminectomy Cervical three-cervical seven;  Surgeon: Eustace Moore, MD;  Location: [place];  Service: Neurosurgery;  Laterality: N/A;  . ROOT CANAL    . s/p lipoma right scalp posteriorly  2011    reports that he has never smoked. He has never used smokeless tobacco. He reports current alcohol use. He reports that he does not use drugs. family history includes Cancer in his mother; Colon cancer (age of onset: 66) in his father; Stroke (age of onset: 54) in his brother. Allergies  Allergen Reactions  . Other Other (See Comments)    Nivaquine. Found in [place] and [place] for Malaria treatment  UNSPECIFIED REACTION   . Levofloxacin Other (See Comments)    Irritates gums   Current Outpatient Medications on File Prior to Visit  Medication Sig Dispense Refill  . aspirin EC 81 MG tablet Take 81 mg by mouth daily.    . furosemide (LASIX) 20 MG tablet Take 1 tablet (20 mg total) by mouth daily as needed. 90 tablet 0  . ibuprofen (ADVIL) 600 MG tablet Take 1 tablet (600 mg total) by mouth every 8 (eight) hours as needed. 60 tablet 2  . methocarbamol (ROBAXIN) 500 MG tablet Take 1 tablet (500 mg total) by mouth every 6 (six) hours as needed for muscle spasms. 60 tablet 0  . potassium chloride (KLOR-CON 10) 10 MEQ tablet Take 1 tablet (10 mEq total) by mouth daily. 90 tablet 3  . rosuvastatin (CRESTOR) 20 MG tablet TAKE 1 TABLET(20 MG) BY MOUTH DAILY 90 tablet 1  .  sildenafil (VIAGRA) 100 MG tablet Take 0.5-1 tablets (50-100 mg total) by mouth daily as needed for erectile dysfunction. 10 tablet 11  . Vitamin D, Ergocalciferol, (DRISDOL) 1.25 MG (50000 UT) CAPS capsule Take 1 capsule (50,000 Units total) by mouth every 7 (seven) days. 12 capsule 0   No current facility-administered medications on file prior to visit.   Review of Systems  Constitutional: Negative for other unusual diaphoresis or sweats HENT: Negative for ear discharge or swelling Eyes: Negative for other worsening visual disturbances Respiratory: Negative for stridor or other swelling   Gastrointestinal: Negative for worsening distension or other blood Genitourinary: Negative for retention or other urinary change Musculoskeletal: Negative for other MSK pain or swelling Skin: Negative for color change or other new lesions Neurological: Negative for worsening tremors and other numbness  Psychiatric/Behavioral: Negative for worsening agitation or other fatigue All otherwise neg per pt     Objective:   Physical Exam BP 122/78   Pulse 85   Temp 98.3 F (36.8 C) (Oral)   Ht 5\' 10"  (1.778 m)   Wt 216 lb (98 kg)   SpO2 95%   BMI 30.99 kg/m  VS noted,  Constitutional: Pt appears in NAD HENT: Head: NCAT.  Right Ear: External ear normal.  Left Ear: External ear normal.  Eyes: . Pupils are equal, round, and reactive to light. Conjunctivae and EOM are normal Nose: without d/c or deformity Neck: Neck supple. Gross normal ROM Cardiovascular: Normal rate and regular rhythm.   Pulmonary/Chest: Effort normal and breath sounds without rales or wheezing.  Abd:  Soft, NT, ND, + BS, no organomegaly Neurological: Pt is alert. At baseline orientation, motor grossly intact Skin: Skin is warm. No rashes, other new lesions, no LE edema Psychiatric: Pt behavior is normal without agitation  All otherwise neg per pt Lab Results  Component Value Date   WBC 5.5 02/20/2019   HGB 13.7 02/20/2019   HCT 41.1 02/20/2019   PLT 157.0 02/20/2019   GLUCOSE 108 (H) 08/22/2019   CHOL 158 02/20/2019   TRIG 60.0 02/20/2019   HDL 58.00 02/20/2019   LDLDIRECT 147.4 01/10/2013   LDLCALC 88 02/20/2019   ALT 15 02/20/2019   AST 21 02/20/2019   NA 140 08/22/2019   K 3.3 (L) 08/22/2019   CL 103 08/22/2019   CREATININE 1.32 08/22/2019   BUN 19 08/22/2019   CO2 28 08/22/2019   TSH 2.79 02/20/2019   PSA 1.62 02/20/2019   INR 1.10 06/10/2018   HGBA1C 5.4 08/22/2019         Assessment & Plan:

## 2019-08-22 NOTE — Patient Instructions (Addendum)
OK to take the Vitamin D3 at 2000 units per day  Please continue all other medications as before, and refills have been done if requested.  Please have the pharmacy call with any other refills you may need.  Please continue your efforts at being more active, low cholesterol diet, and weight control.  Please keep your appointments with your specialists as you may have planned  Please go to the LAB in the Basement (turn left off the elevator) for the tests to be done today  You will be contacted by phone if any changes need to be made immediately.  Otherwise, you will receive a letter about your results with an explanation, but please check with MyChart first.  Please remember to sign up for MyChart if you have not done so, as this will be important to you in the future with finding out test results, communicating by private email, and scheduling acute appointments online when needed.  Please return in 6 months, or sooner if needed

## 2019-08-24 ENCOUNTER — Encounter: Payer: Self-pay | Admitting: Internal Medicine

## 2019-08-24 NOTE — Assessment & Plan Note (Signed)
To continue oral replacement 

## 2019-08-24 NOTE — Assessment & Plan Note (Signed)
Improved,  to f/u any worsening symptoms or concerns 

## 2019-08-24 NOTE — Assessment & Plan Note (Signed)
stable overall by history and exam, recent data reviewed with pt, and pt to continue medical treatment as before,  to f/u any worsening symptoms or concerns  

## 2019-08-24 NOTE — Assessment & Plan Note (Signed)
stable overall by history and exam, recent data reviewed with pt, and pt to continue medical treatment as before,  to f/u any worsening symptoms or concerns. For a1c with labs 

## 2019-08-24 NOTE — Assessment & Plan Note (Signed)
For fu lab,  to f/u any worsening symptoms or concerns  

## 2019-08-25 ENCOUNTER — Other Ambulatory Visit: Payer: Self-pay | Admitting: Internal Medicine

## 2019-08-25 MED ORDER — POTASSIUM CHLORIDE ER 10 MEQ PO TBCR: 20.0000 meq | EXTENDED_RELEASE_TABLET | Freq: Every day | ORAL | 3 refills | Status: DC

## 2019-08-25 MED ORDER — VITAMIN D (ERGOCALCIFEROL) 1.25 MG (50000 UNIT) PO CAPS: 50000.0000 [IU] | ORAL_CAPSULE | ORAL | 0 refills | Status: DC

## 2019-09-25 IMAGING — DX DG CHEST 2V
2 series · 2 of 2 positions shown · non-contrast
Comparison: 12/26/2017

CLINICAL DATA: BILATERAL leg swelling for 2 weeks, dyspnea on
exertion

EXAM:
CHEST - 2 VIEW

[chest pa]
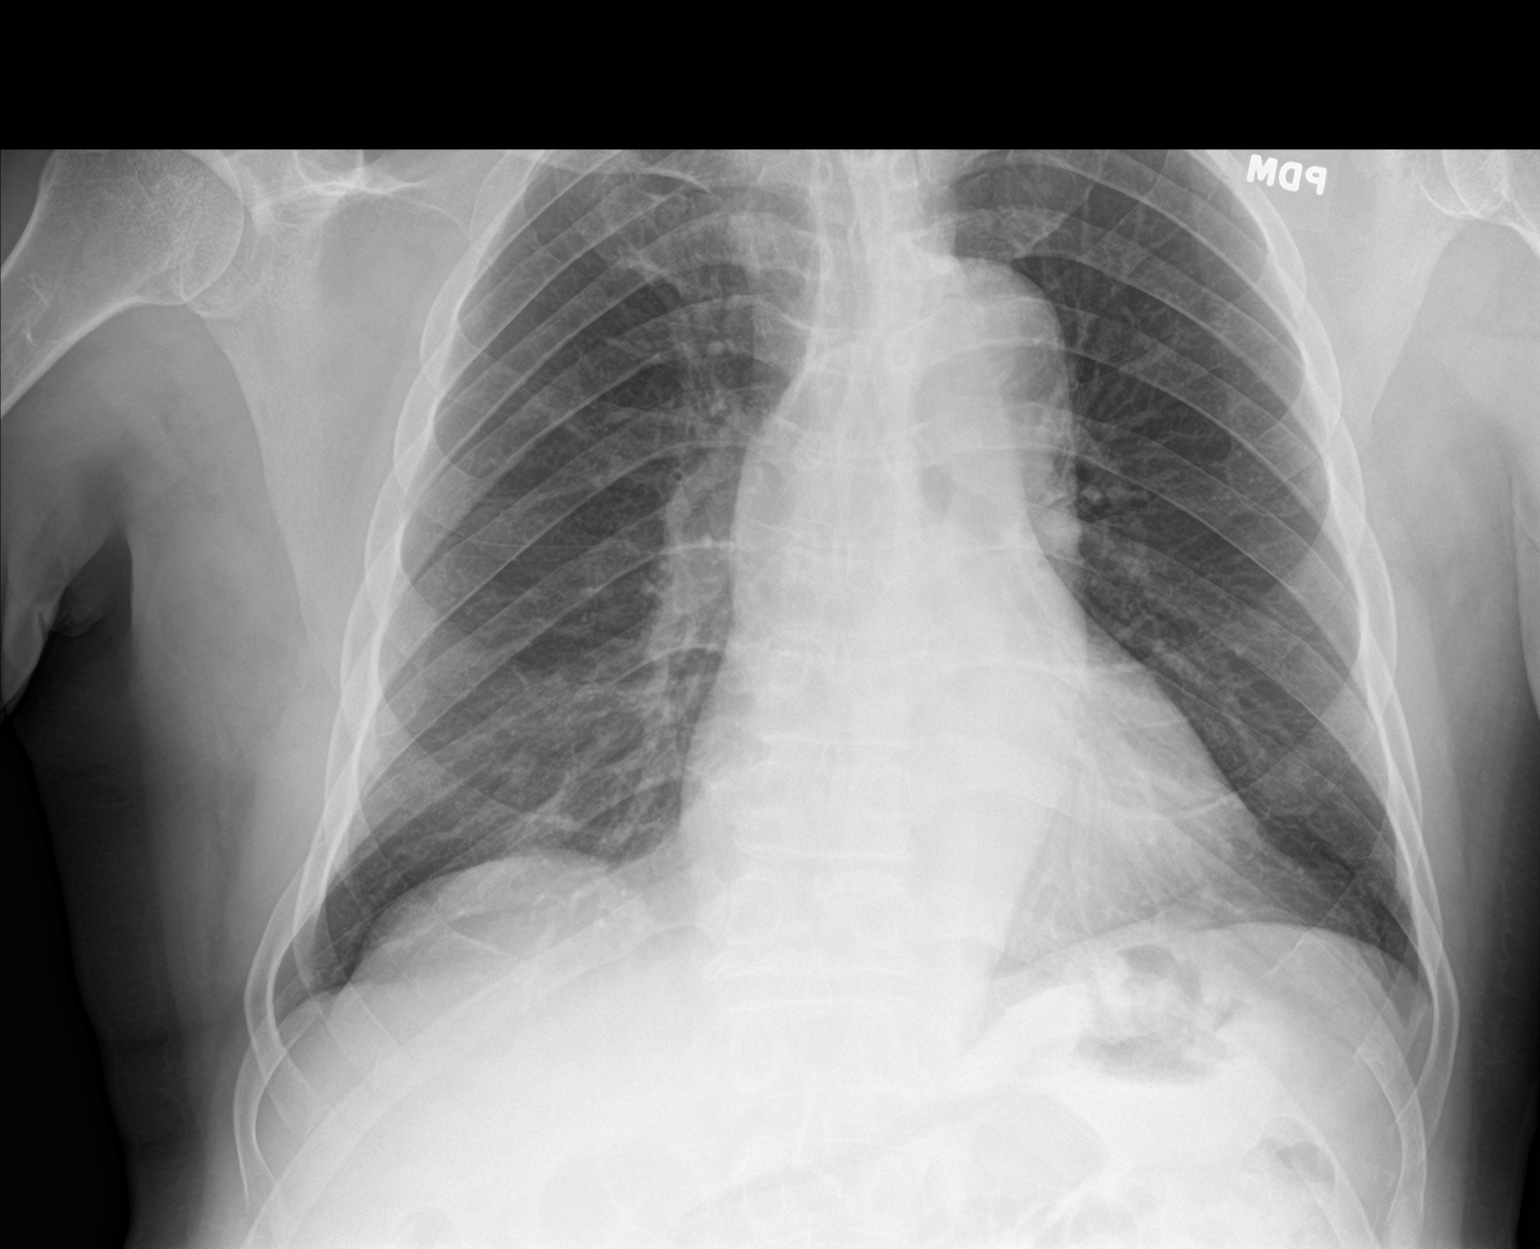

[chest lat]
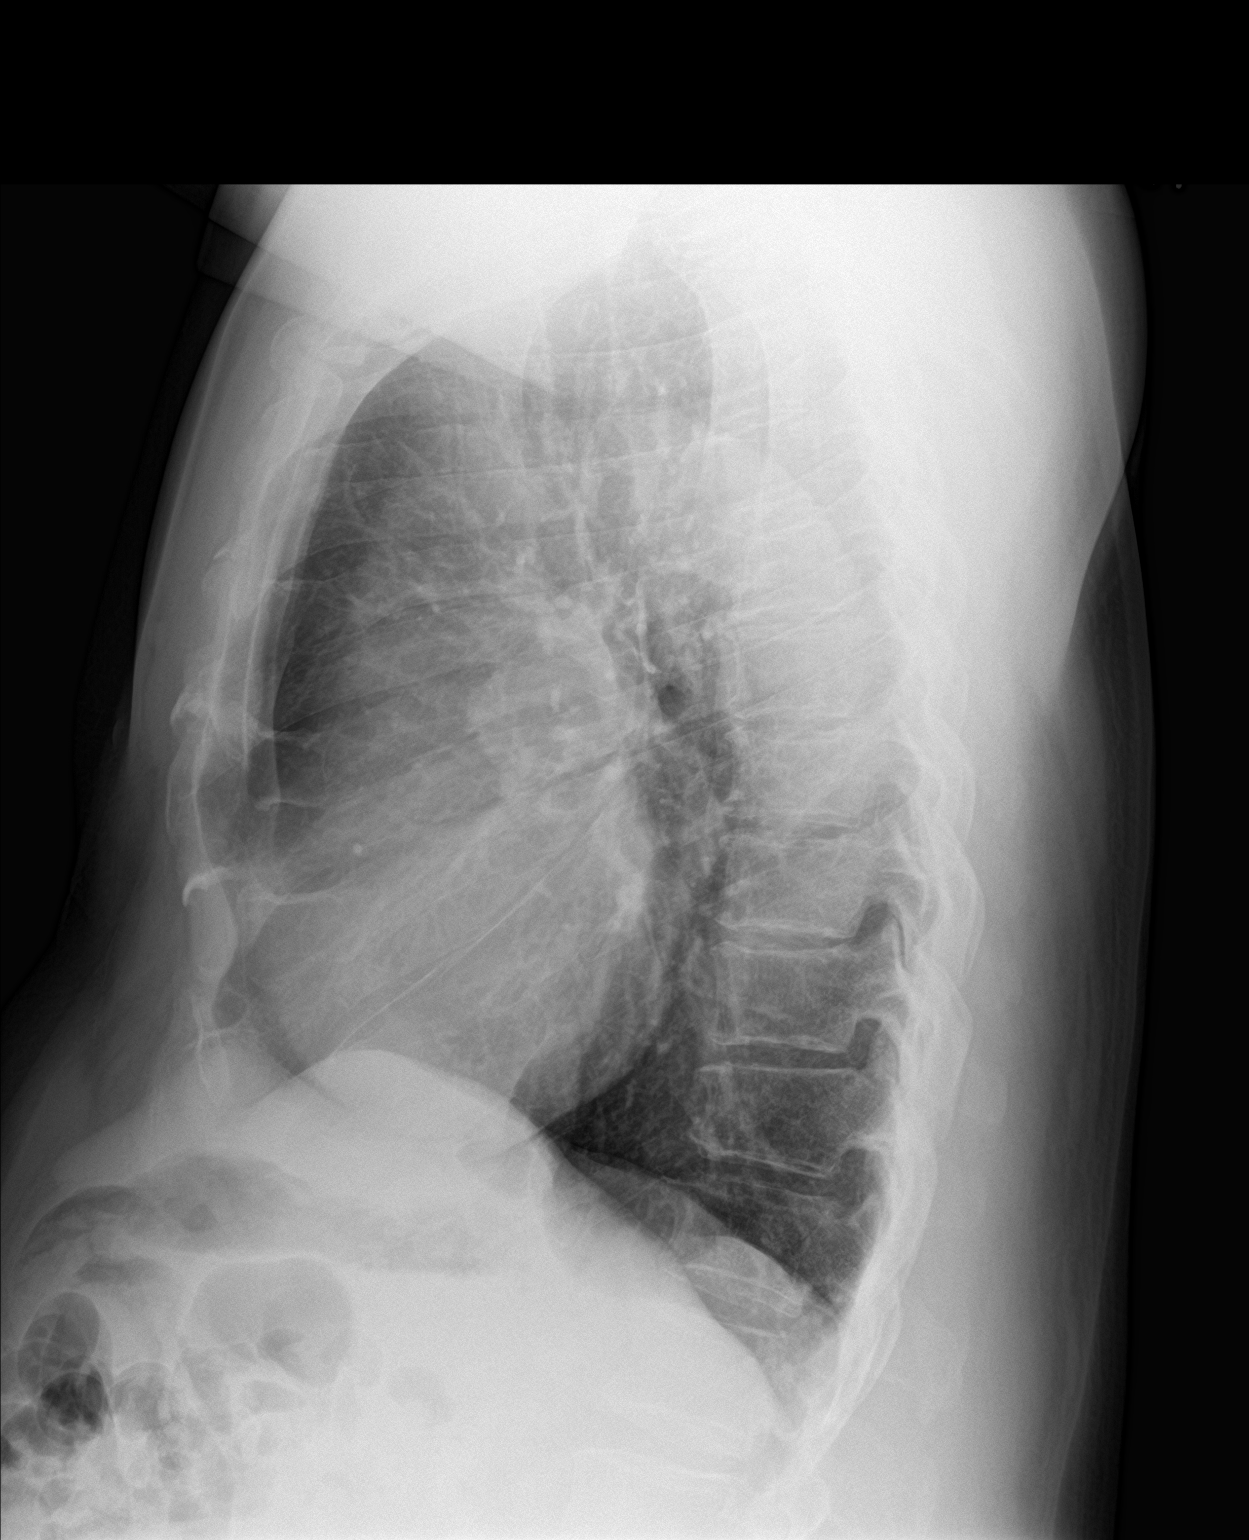

[2 of 2 positions shown; findings below may reference images not displayed]

FINDINGS: Normal heart size, mediastinal contours, and pulmonary vascularity.

Lungs clear.

No pulmonary infiltrate, pleural effusion or pneumothorax.

Bones unremarkable.
IMPRESSION: No acute abnormalities.

## 2019-10-05 ENCOUNTER — Other Ambulatory Visit: Payer: Self-pay | Admitting: Internal Medicine

## 2019-10-05 NOTE — Telephone Encounter (Signed)
Please refill as per office routine med refill policy (all routine meds refilled for 3 mo or monthly per pt preference up to one year from last visit, then month to month grace period for 3 mo, then further med refills will have to be denied)  

## 2019-10-07 ENCOUNTER — Telehealth: Payer: Self-pay

## 2019-10-07 MED ORDER — ROSUVASTATIN CALCIUM 20 MG PO TABS: ORAL_TABLET | ORAL | 1 refills | Status: DC

## 2019-10-07 NOTE — Telephone Encounter (Signed)
Reviewed chart pt is up-to-date sent refills to [facility].../lmb  

## 2019-10-07 NOTE — Telephone Encounter (Signed)
New message    1. Which medications need to be refilled? (please list name of each medication and dose if known)rosuvastatin (CRESTOR) 20 MG tablet  2. Which pharmacy/location (including street and city if local pharmacy) is medication to be sent to? [facility]   3. Do they need a 30 day or 90 day supply? 90 day supply

## 2019-10-20 DIAGNOSIS — M5136 Other intervertebral disc degeneration, lumbar region: Secondary | ICD-10-CM | POA: Diagnosis not present

## 2019-10-20 DIAGNOSIS — M1990 Unspecified osteoarthritis, unspecified site: Secondary | ICD-10-CM | POA: Diagnosis not present

## 2019-10-20 DIAGNOSIS — R789 Finding of unspecified substance, not normally found in blood: Secondary | ICD-10-CM | POA: Diagnosis not present

## 2019-10-20 DIAGNOSIS — M2550 Pain in unspecified joint: Secondary | ICD-10-CM | POA: Diagnosis not present

## 2019-10-20 DIAGNOSIS — M5030 Other cervical disc degeneration, unspecified cervical region: Secondary | ICD-10-CM | POA: Diagnosis not present

## 2019-10-20 DIAGNOSIS — E785 Hyperlipidemia, unspecified: Secondary | ICD-10-CM | POA: Diagnosis not present

## 2019-10-20 DIAGNOSIS — M069 Rheumatoid arthritis, unspecified: Secondary | ICD-10-CM | POA: Diagnosis not present

## 2019-10-29 IMAGING — CR DG LUMBAR SPINE 2-3V
2 series · 2 of 2 positions shown · non-contrast
Comparison: None.

CLINICAL DATA: L1-L5 decompressive lumbar surgery

EXAM:
LUMBAR SPINE - 2-3 VIEW

[lateral (1 of 2)]
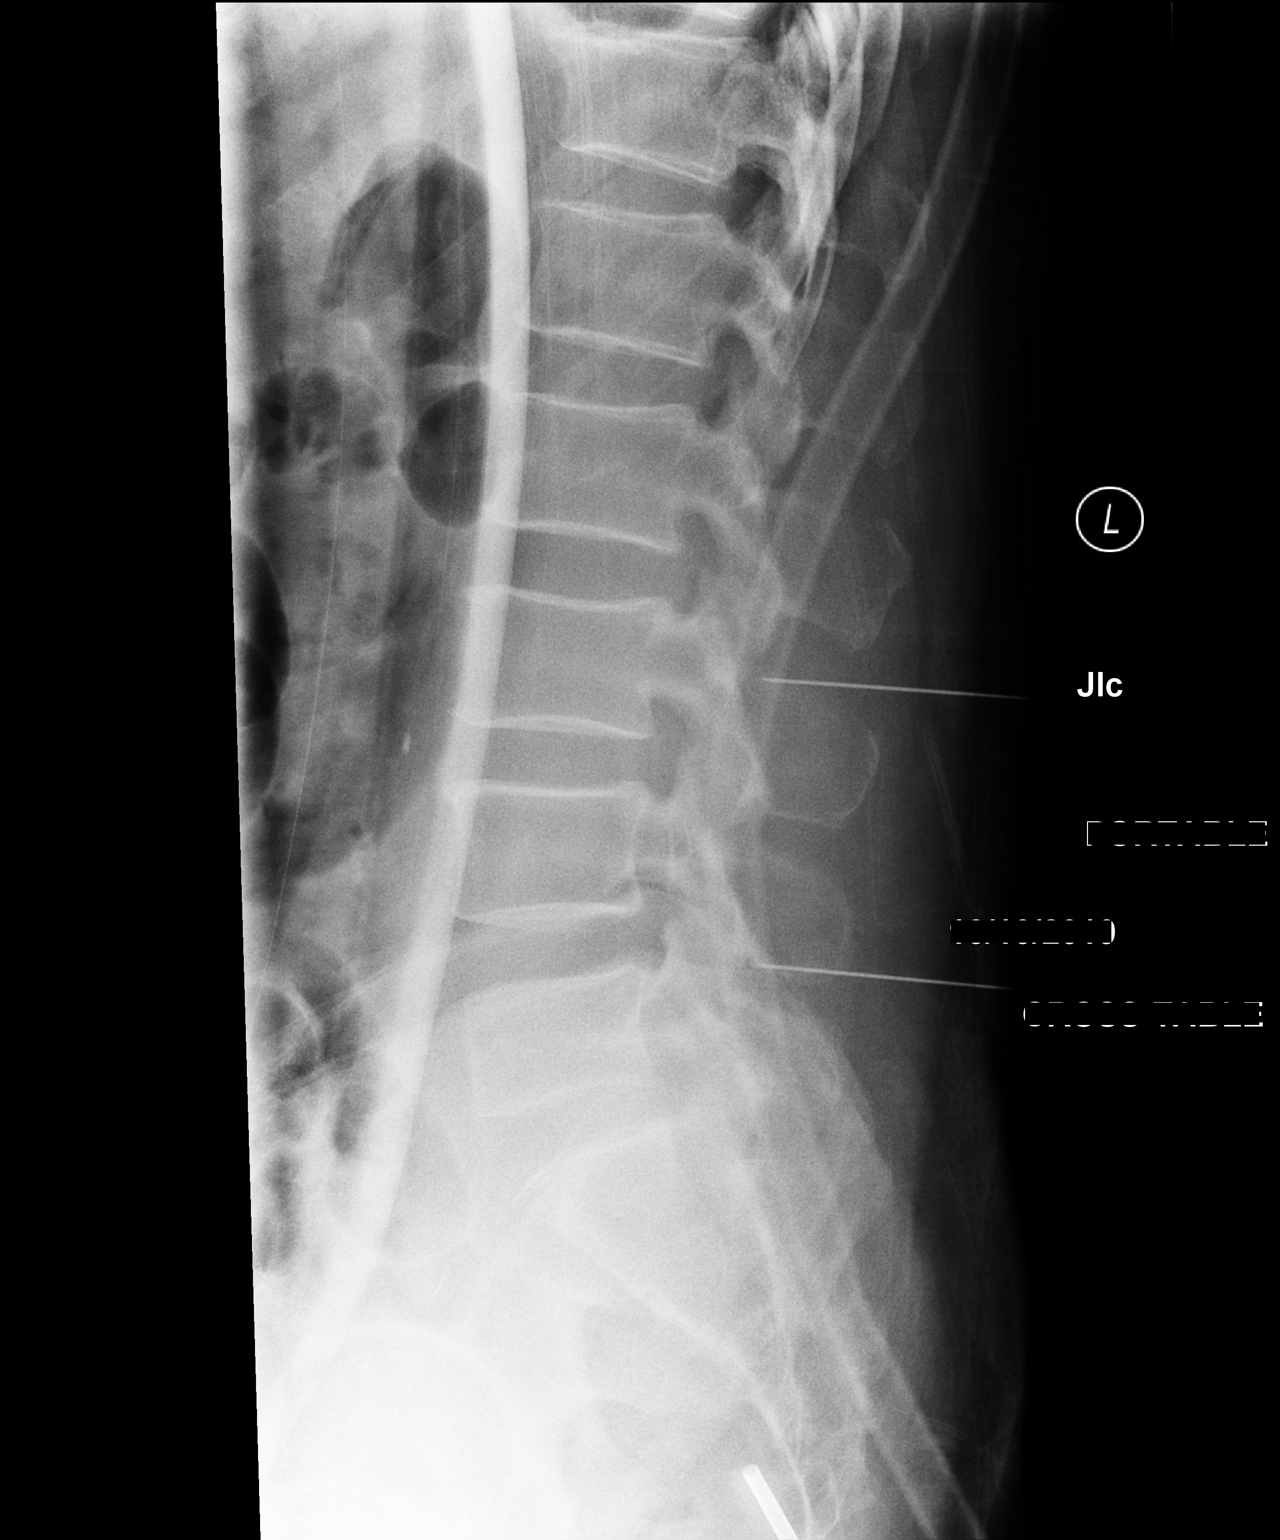

[lateral (2 of 2)]
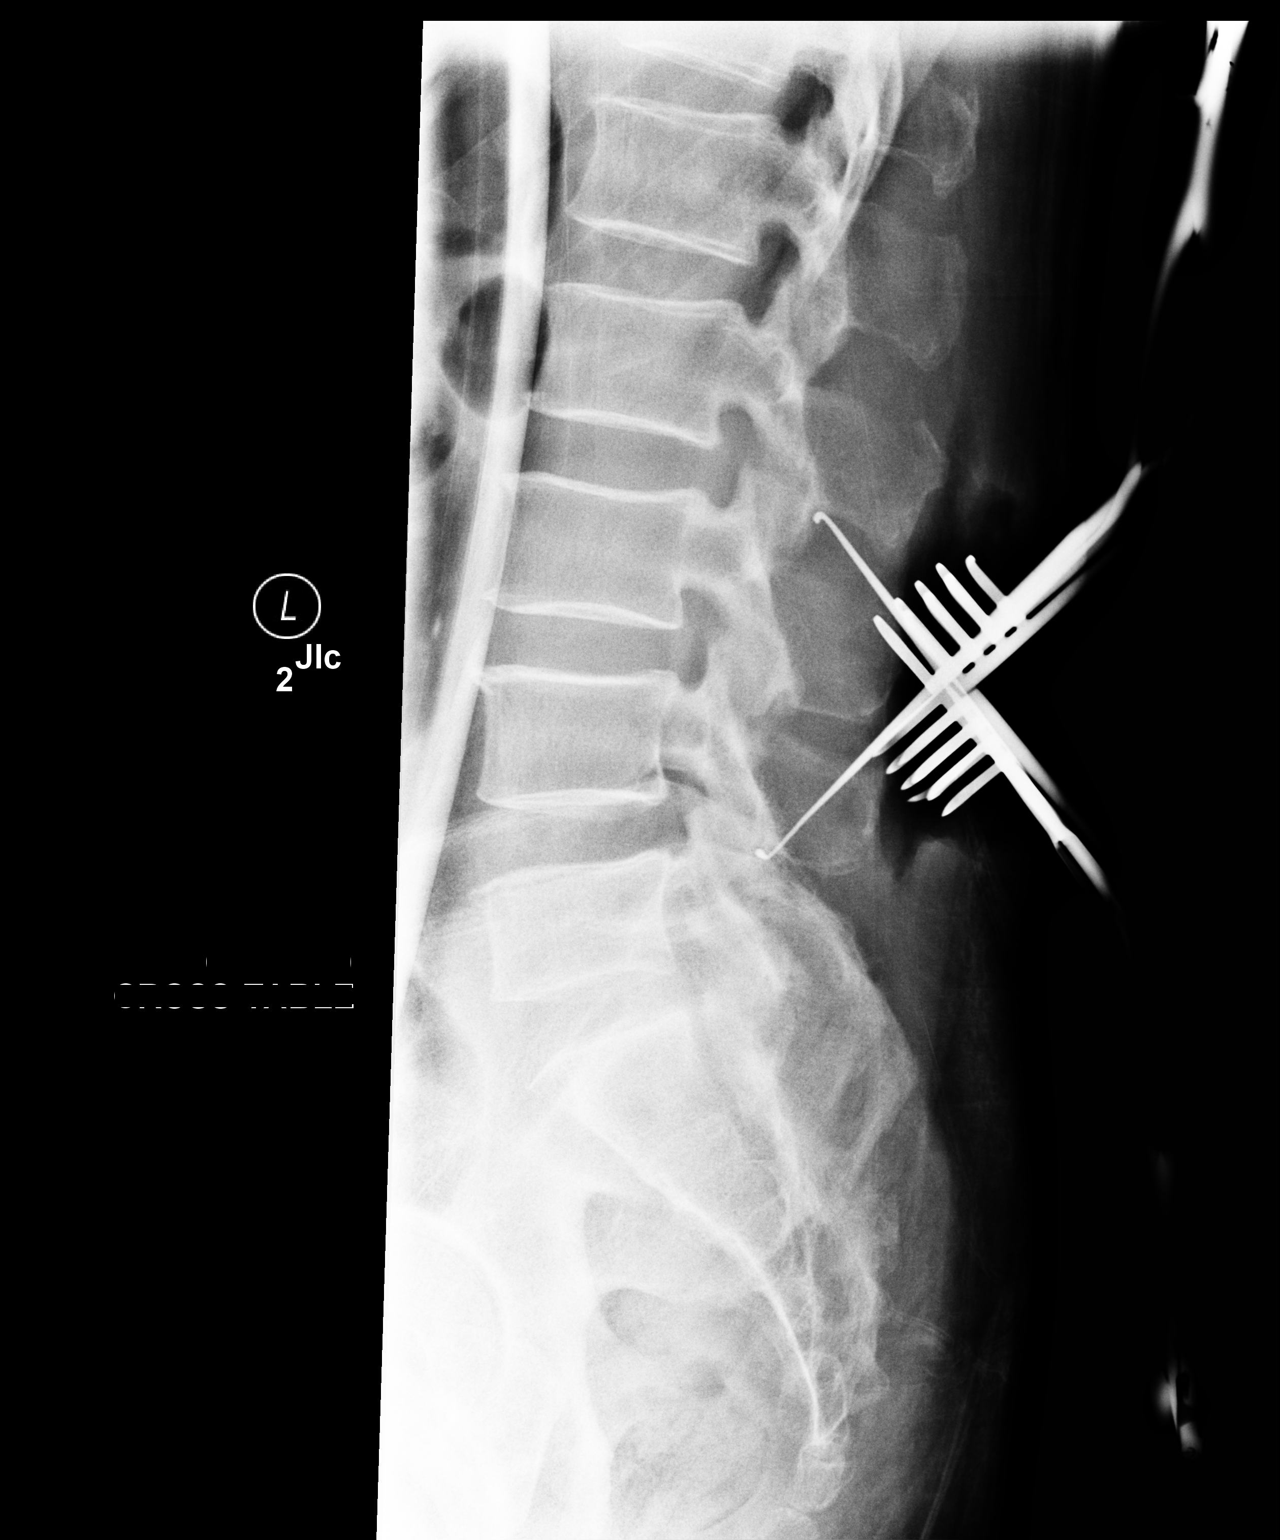

[2 of 2 positions shown; findings below may reference images not displayed]

FINDINGS: Two intraoperative LATERAL views of the lumbar spine are submitted
postoperatively for interpretation.

Film 1 demonstrates 2 posterior metallic probes, the UPPER probe
between the between the L2 and L3 spinous processes and the LOWER
probe overlying the LOWER aspect of the L4 spinous process.

Film 2 demonstrates 2 posterior metallic probes, the UPPER probe
between the L2 and L3 spinous processes directed at the LOWER aspect
of L2 and the LOWER probe at the level of L4-5 posteriorly.
IMPRESSION: Localizers

## 2019-11-26 ENCOUNTER — Other Ambulatory Visit: Payer: Self-pay | Admitting: Internal Medicine

## 2019-11-28 ENCOUNTER — Telehealth: Payer: Self-pay | Admitting: Internal Medicine

## 2019-11-28 NOTE — Telephone Encounter (Signed)
    1.Medication Requested: Vitamin D, Ergocalciferol, (DRISDOL) 1.25 MG (50000 UT) CAPS capsule  2. Pharmacy (Name, Street, [place] [facility] [place], [place] - [address] AT [address]  3. On Med List: yes  4. Last Visit with PCP: 08/22/19  5. Next visit date with PCP: 02/20/20   Agent: Please be advised that RX refills may take up to 3 business days. We ask that you follow-up with your pharmacy.

## 2019-11-28 NOTE — Telephone Encounter (Signed)
Please change to OTC Vitamin D3 at 2000 units per day, indefinitely.  

## 2019-12-01 ENCOUNTER — Telehealth: Payer: Self-pay

## 2019-12-01 MED ORDER — IBUPROFEN 600 MG PO TABS: 600.0000 mg | ORAL_TABLET | Freq: Three times a day (TID) | ORAL | 2 refills | Status: DC | PRN

## 2019-12-01 NOTE — Telephone Encounter (Signed)
Spoke with patient today and info given. 

## 2019-12-01 NOTE — Telephone Encounter (Signed)
none

## 2020-01-29 DIAGNOSIS — H18413 Arcus senilis, bilateral: Secondary | ICD-10-CM | POA: Diagnosis not present

## 2020-01-29 DIAGNOSIS — H524 Presbyopia: Secondary | ICD-10-CM | POA: Diagnosis not present

## 2020-01-29 DIAGNOSIS — H52223 Regular astigmatism, bilateral: Secondary | ICD-10-CM | POA: Diagnosis not present

## 2020-01-29 DIAGNOSIS — H11153 Pinguecula, bilateral: Secondary | ICD-10-CM | POA: Diagnosis not present

## 2020-01-29 DIAGNOSIS — H5203 Hypermetropia, bilateral: Secondary | ICD-10-CM | POA: Diagnosis not present

## 2020-01-29 DIAGNOSIS — H25813 Combined forms of age-related cataract, bilateral: Secondary | ICD-10-CM | POA: Diagnosis not present

## 2020-02-20 ENCOUNTER — Encounter: Payer: Self-pay | Admitting: Internal Medicine

## 2020-02-20 ENCOUNTER — Other Ambulatory Visit: Payer: Self-pay

## 2020-02-20 ENCOUNTER — Ambulatory Visit (INDEPENDENT_AMBULATORY_CARE_PROVIDER_SITE_OTHER): Payer: Medicare Other | Admitting: Internal Medicine

## 2020-02-20 ENCOUNTER — Ambulatory Visit (INDEPENDENT_AMBULATORY_CARE_PROVIDER_SITE_OTHER): Payer: Medicare Other

## 2020-02-20 VITALS — BP 120/80 | HR 66 | Temp 98.3°F | Resp 16 | Ht 70.0 in | Wt 214.8 lb

## 2020-02-20 VITALS — BP 120/78 | HR 59 | Temp 98.3°F | Ht 70.0 in | Wt 214.0 lb

## 2020-02-20 DIAGNOSIS — N183 Chronic kidney disease, stage 3 unspecified: Secondary | ICD-10-CM | POA: Diagnosis not present

## 2020-02-20 DIAGNOSIS — N320 Bladder-neck obstruction: Secondary | ICD-10-CM | POA: Diagnosis not present

## 2020-02-20 DIAGNOSIS — R7302 Impaired glucose tolerance (oral): Secondary | ICD-10-CM

## 2020-02-20 DIAGNOSIS — E538 Deficiency of other specified B group vitamins: Secondary | ICD-10-CM

## 2020-02-20 DIAGNOSIS — E785 Hyperlipidemia, unspecified: Secondary | ICD-10-CM | POA: Diagnosis not present

## 2020-02-20 DIAGNOSIS — E559 Vitamin D deficiency, unspecified: Secondary | ICD-10-CM | POA: Diagnosis not present

## 2020-02-20 DIAGNOSIS — Z0000 Encounter for general adult medical examination without abnormal findings: Secondary | ICD-10-CM

## 2020-02-20 LAB — PSA: PSA: 1.32 ng/mL (ref 0.10–4.00)

## 2020-02-20 LAB — CBC WITH DIFFERENTIAL/PLATELET
Basophils Absolute: 0 10*3/uL (ref 0.0–0.1)
Basophils Relative: 0.6 % (ref 0.0–3.0)
Eosinophils Absolute: 0.3 10*3/uL (ref 0.0–0.7)
Eosinophils Relative: 4.5 % (ref 0.0–5.0)
HCT: 42.1 % (ref 39.0–52.0)
Hemoglobin: 14 g/dL (ref 13.0–17.0)
Lymphocytes Relative: 46.6 % — ABNORMAL HIGH (ref 12.0–46.0)
Lymphs Abs: 3.3 10*3/uL (ref 0.7–4.0)
MCHC: 33.3 g/dL (ref 30.0–36.0)
MCV: 92.9 fl (ref 78.0–100.0)
Monocytes Absolute: 0.8 10*3/uL (ref 0.1–1.0)
Monocytes Relative: 10.7 % (ref 3.0–12.0)
Neutro Abs: 2.6 10*3/uL (ref 1.4–7.7)
Neutrophils Relative %: 37.6 % — ABNORMAL LOW (ref 43.0–77.0)
Platelets: 167 10*3/uL (ref 150.0–400.0)
RBC: 4.54 Mil/uL (ref 4.22–5.81)
RDW: 13 % (ref 11.5–15.5)
WBC: 7 10*3/uL (ref 4.0–10.5)

## 2020-02-20 LAB — BASIC METABOLIC PANEL
BUN: 14 mg/dL (ref 6–23)
CO2: 31 mEq/L (ref 19–32)
Calcium: 9.2 mg/dL (ref 8.4–10.5)
Chloride: 103 mEq/L (ref 96–112)
Creatinine, Ser: 1.23 mg/dL (ref 0.40–1.50)
GFR: 70.2 mL/min (ref >60.00)
Glucose, Bld: 88 mg/dL (ref 70–99)
Potassium: 3.8 mEq/L (ref 3.5–5.1)
Sodium: 139 mEq/L (ref 135–145)

## 2020-02-20 LAB — URINALYSIS, ROUTINE W REFLEX MICROSCOPIC
Bilirubin Urine: NEGATIVE
Ketones, ur: NEGATIVE
Leukocytes,Ua: NEGATIVE
Nitrite: NEGATIVE
Specific Gravity, Urine: 1.025 (ref 1.000–1.030)
Total Protein, Urine: NEGATIVE
Urine Glucose: NEGATIVE
Urobilinogen, UA: 1 (ref 0.0–1.0)
WBC, UA: NONE SEEN (ref 0–?)
pH: 6 (ref 5.0–8.0)

## 2020-02-20 LAB — LIPID PANEL
Cholesterol: 169 mg/dL (ref 0–200)
HDL: 70.3 mg/dL (ref >39.00)
LDL Cholesterol: 88 mg/dL (ref 0–99)
NonHDL: 99.04
Total CHOL/HDL Ratio: 2
Triglycerides: 56 mg/dL (ref 0.0–149.0)
VLDL: 11.2 mg/dL (ref 0.0–40.0)

## 2020-02-20 LAB — HEPATIC FUNCTION PANEL
ALT: 14 U/L (ref 0–53)
AST: 21 U/L (ref 0–37)
Albumin: 4.2 g/dL (ref 3.5–5.2)
Alkaline Phosphatase: 71 U/L (ref 39–117)
Bilirubin, Direct: 0.3 mg/dL (ref 0.0–0.3)
Total Bilirubin: 1.4 mg/dL — ABNORMAL HIGH (ref 0.2–1.2)
Total Protein: 6.7 g/dL (ref 6.0–8.3)

## 2020-02-20 LAB — TSH: TSH: 2.38 u[IU]/mL (ref 0.35–4.50)

## 2020-02-20 LAB — VITAMIN D 25 HYDROXY (VIT D DEFICIENCY, FRACTURES): VITD: 38.38 ng/mL (ref 30.00–100.00)

## 2020-02-20 LAB — VITAMIN B12: Vitamin B-12: 416 pg/mL (ref 211–911)

## 2020-02-20 MED ORDER — METHOCARBAMOL 500 MG PO TABS: 500.0000 mg | ORAL_TABLET | Freq: Three times a day (TID) | ORAL | 1 refills | Status: DC | PRN

## 2020-02-20 NOTE — Patient Instructions (Signed)
[name] , Thank you for taking time to come for your Medicare Wellness Visit. I appreciate your ongoing commitment to your health goals. Please review the following plan we discussed and let me know if I can assist you in the future.   Screening recommendations/referrals: Colonoscopy: last done 08/22/2011; due every 5 years Recommended yearly ophthalmology/optometry visit for glaucoma screening and checkup Recommended yearly dental visit for hygiene and checkup  Vaccinations: Influenza vaccine: 06/10/2019 Pneumococcal vaccine: completed Tdap vaccine: 02/20/2019; due every 10 years Shingles vaccine: completed Covid-19: completed; please bring immunization card to next appointment for documentation  Advanced directives: Advance directive discussed with you today. Patient denied paperwork.  Conditions/risks identified:  Reviewed health maintenance screenings with patient today and relevant education, vaccines, and/or referrals were provided.    Continue doing brain stimulating activities (puzzles, reading, adult coloring books, staying active) to keep memory sharp.    Continue to eat heart healthy diet (full of fruits, vegetables, whole grains, lean protein, water--limit salt, fat, and sugar intake) and increase physical activity as tolerated.  Next appointment: Please schedule your 1 year Medicare Wellness Visit with your Health Coach.  Preventive Care 28 Years and Older, Male Preventive care refers to lifestyle choices and visits with your health care provider that can promote health and wellness. What does preventive care include?  A yearly physical exam. This is also called an annual well check.  Dental exams once or twice a year.  Routine eye exams. Ask your health care provider how often you should have your eyes checked.  Personal lifestyle choices, including:  Daily care of your teeth and gums.  Regular physical activity.  Eating a healthy diet.  Avoiding tobacco and  drug use.  Limiting alcohol use.  Practicing safe sex.  Taking low doses of aspirin every day.  Taking vitamin and mineral supplements as recommended by your health care provider. What happens during an annual well check? The services and screenings done by your health care provider during your annual well check will depend on your age, overall health, lifestyle risk factors, and family history of disease. Counseling  Your health care provider may ask you questions about your:  Alcohol use.  Tobacco use.  Drug use.  Emotional well-being.  Home and relationship well-being.  Sexual activity.  Eating habits.  History of falls.  Memory and ability to understand (cognition).  Work and work Statistician. Screening  You may have the following tests or measurements:  Height, weight, and BMI.  Blood pressure.  Lipid and cholesterol levels. These may be checked every 5 years, or more frequently if you are over 77 years old.  Skin check.  Lung cancer screening. You may have this screening every year starting at age 53 if you have a 30-pack-year history of smoking and currently smoke or have quit within the past 15 years.  Fecal occult blood test (FOBT) of the stool. You may have this test every year starting at age 20.  Flexible sigmoidoscopy or colonoscopy. You may have a sigmoidoscopy every 5 years or a colonoscopy every 10 years starting at age 33.  Prostate cancer screening. Recommendations will vary depending on your family history and other risks.  Hepatitis C blood test.  Hepatitis B blood test.  Sexually transmitted disease (STD) testing.  Diabetes screening. This is done by checking your blood sugar (glucose) after you have not eaten for a while (fasting). You may have this done every 1-3 years.  Abdominal aortic aneurysm (AAA) screening. You may need  this if you are a current or former smoker.  Osteoporosis. You may be screened starting at age 30 if you are  at high risk. Talk with your health care provider about your test results, treatment options, and if necessary, the need for more tests. Vaccines  Your health care provider may recommend certain vaccines, such as:  Influenza vaccine. This is recommended every year.  Tetanus, diphtheria, and acellular pertussis (Tdap, Td) vaccine. You may need a Td booster every 10 years.  Zoster vaccine. You may need this after age 54.  Pneumococcal 13-valent conjugate (PCV13) vaccine. One dose is recommended after age 23.  Pneumococcal polysaccharide (PPSV23) vaccine. One dose is recommended after age 58. Talk to your health care provider about which screenings and vaccines you need and how often you need them. This information is not intended to replace advice given to you by your health care provider. Make sure you discuss any questions you have with your health care provider. Document Released: 09/24/2015 Document Revised: 05/17/2016 Document Reviewed: 06/29/2015 Elsevier Interactive Patient Education  2017 [employer] Prevention in the Home Falls can cause injuries. They can happen to people of all ages. There are many things you can do to make your home safe and to help prevent falls. What can I do on the outside of my home?  Regularly fix the edges of walkways and driveways and fix any cracks.  Remove anything that might make you trip as you walk through a door, such as a raised step or threshold.  Trim any bushes or trees on the path to your home.  Use bright outdoor lighting.  Clear any walking paths of anything that might make someone trip, such as rocks or tools.  Regularly check to see if handrails are loose or broken. Make sure that both sides of any steps have handrails.  Any raised decks and porches should have guardrails on the edges.  Have any leaves, snow, or ice cleared regularly.  Use sand or salt on walking paths during winter.  Clean up any spills in your garage  right away. This includes oil or grease spills. What can I do in the bathroom?  Use night lights.  Install grab bars by the toilet and in the tub and shower. Do not use towel bars as grab bars.  Use non-skid mats or decals in the tub or shower.  If you need to sit down in the shower, use a plastic, non-slip stool.  Keep the floor dry. Clean up any water that spills on the floor as soon as it happens.  Remove soap buildup in the tub or shower regularly.  Attach bath mats securely with double-sided non-slip rug tape.  Do not have throw rugs and other things on the floor that can make you trip. What can I do in the bedroom?  Use night lights.  Make sure that you have a light by your bed that is easy to reach.  Do not use any sheets or blankets that are too big for your bed. They should not hang down onto the floor.  Have a firm chair that has side arms. You can use this for support while you get dressed.  Do not have throw rugs and other things on the floor that can make you trip. What can I do in the kitchen?  Clean up any spills right away.  Avoid walking on wet floors.  Keep items that you use a lot in easy-to-reach places.  If you  need to reach something above you, use a strong step stool that has a grab bar.  Keep electrical cords out of the way.  Do not use floor polish or wax that makes floors slippery. If you must use wax, use non-skid floor wax.  Do not have throw rugs and other things on the floor that can make you trip. What can I do with my stairs?  Do not leave any items on the stairs.  Make sure that there are handrails on both sides of the stairs and use them. Fix handrails that are broken or loose. Make sure that handrails are as long as the stairways.  Check any carpeting to make sure that it is firmly attached to the stairs. Fix any carpet that is loose or worn.  Avoid having throw rugs at the top or bottom of the stairs. If you do have throw rugs,  attach them to the floor with carpet tape.  Make sure that you have a light switch at the top of the stairs and the bottom of the stairs. If you do not have them, ask someone to add them for you. What else can I do to help prevent falls?  Wear shoes that:  Do not have high heels.  Have rubber bottoms.  Are comfortable and fit you well.  Are closed at the toe. Do not wear sandals.  If you use a stepladder:  Make sure that it is fully opened. Do not climb a closed stepladder.  Make sure that both sides of the stepladder are locked into place.  Ask someone to hold it for you, if possible.  Clearly mark and make sure that you can see:  Any grab bars or handrails.  First and last steps.  Where the edge of each step is.  Use tools that help you move around (mobility aids) if they are needed. These include:  Canes.  Walkers.  Scooters.  Crutches.  Turn on the lights when you go into a dark area. Replace any light bulbs as soon as they burn out.  Set up your furniture so you have a clear path. Avoid moving your furniture around.  If any of your floors are uneven, fix them.  If there are any pets around you, be aware of where they are.  Review your medicines with your doctor. Some medicines can make you feel dizzy. This can increase your chance of falling. Ask your doctor what other things that you can do to help prevent falls. This information is not intended to replace advice given to you by your health care provider. Make sure you discuss any questions you have with your health care provider. Document Released: 06/24/2009 Document Revised: 02/03/2016 Document Reviewed: 10/02/2014 Elsevier Interactive Patient Education  2017 [employer].

## 2020-02-20 NOTE — Progress Notes (Signed)
Subjective:    Patient ID: Matthew Tucker, male    DOB: [birth year], 69 y.o.   MRN: [id number]  HPI  Here to f/u; overall doing ok,  Pt denies chest pain, increasing sob or doe, wheezing, orthopnea, PND, increased LE swelling, palpitations, dizziness or syncope.  Pt denies new neurological symptoms such as new headache, or facial or extremity weakness or numbness.  Pt denies polydipsia, polyuria, or low sugar episode.  Pt states overall good compliance with meds, mostly trying to follow appropriate diet, with wt overall stable,  but little exercise however. Tolerating vit d Past Medical History:  Diagnosis Date  . Arthritis    Neck, bilateral hands  . BACK PAIN, CHRONIC 10/31/2010  . Cervical stenosis of spine   . Chronic lumbar radiculopathy 05/20/2015  . Diastolic dysfunction 1/66/0630  . Family history of colon cancer 01/22/13   Father died at 43yo  . HYPERLIPIDEMIA 09/02/2007  . Impaired glucose tolerance 08/29/2011  . PARESTHESIA 09/02/2007  . Toxic effect of chlorine gas(987.6) 10/31/2010   Past Surgical History:  Procedure Laterality Date  . BUNIONECTOMY    . COLONOSCOPY    . FOOT SURGERY Right   . LUMBAR LAMINECTOMY/DECOMPRESSION MICRODISCECTOMY N/A 06/20/2018   Procedure: Laminectomy and Foraminotomy - Lumbar one-Lumbar two - Lumbar two-Lumbar three - Lumbar three-Lumbar four - Lumbar four-Lumbar five;  Surgeon: Eustace Moore, MD;  Location: [facility];  Service: Neurosurgery;  Laterality: N/A;  . mass removal     back, forehead; benign (lipoma)   . mass removal  2011   head; benign  . POSTERIOR CERVICAL FUSION/FORAMINOTOMY N/A 01/03/2018   Procedure: Posterior Cervical Fusion with lateral mass fixation - Cervical three - Cervical seven, cervical laminectomy Cervical three-cervical seven;  Surgeon: Eustace Moore, MD;  Location: [facility];  Service: Neurosurgery;  Laterality: N/A;  . ROOT CANAL    . s/p lipoma right scalp posteriorly  2011    reports that he has never smoked. He has  never used smokeless tobacco. He reports current alcohol use. He reports that he does not use drugs. family history includes Cancer in his mother; Colon cancer (age of onset: 17) in his father; Stroke (age of onset: 25) in his brother. Allergies  Allergen Reactions  . Other Other (See Comments)    Nivaquine. Found in [place] and [place] for Malaria treatment  UNSPECIFIED REACTION   . Levofloxacin Other (See Comments)    Irritates gums   Current Outpatient Medications on File Prior to Visit  Medication Sig Dispense Refill  . aspirin EC 81 MG tablet Take 81 mg by mouth daily.    . furosemide (LASIX) 20 MG tablet Take 1 tablet (20 mg total) by mouth daily. 90 tablet 3  . ibuprofen (ADVIL) 600 MG tablet Take 1 tablet (600 mg total) by mouth every 8 (eight) hours as needed. 60 tablet 2  . potassium chloride (KLOR-CON 10) 10 MEQ tablet Take 2 tablets (20 mEq total) by mouth daily. 180 tablet 3  . rosuvastatin (CRESTOR) 20 MG tablet TAKE 1 TABLET(20 MG) BY MOUTH DAILY 90 tablet 1  . sildenafil (VIAGRA) 100 MG tablet Take 0.5-1 tablets (50-100 mg total) by mouth daily as needed for erectile dysfunction. 10 tablet 11   No current facility-administered medications on file prior to visit.   Review of Systems All otherwise neg per pt    Objective:   Physical Exam BP 120/78 (BP Location: Left Arm, Patient Position: Sitting, Cuff Size: Large)   Pulse (!) 59  Temp 98.3 F (36.8 C) (Oral)   Ht 5\' 10"  (1.778 m)   Wt 214 lb (97.1 kg)   SpO2 96%   BMI 30.71 kg/m  VS noted,  Constitutional: Pt appears in NAD HENT: Head: NCAT.  Right Ear: External ear normal.  Left Ear: External ear normal.  Eyes: . Pupils are equal, round, and reactive to light. Conjunctivae and EOM are normal Nose: without d/c or deformity Neck: Neck supple. Gross normal ROM Cardiovascular: Normal rate and regular rhythm.   Pulmonary/Chest: Effort normal and breath sounds without rales or wheezing.  Abd:  Soft, NT, ND, +  BS, no organomegaly Neurological: Pt is alert. At baseline orientation, motor grossly intact Skin: Skin is warm. No rashes, other new lesions, no LE edema Psychiatric: Pt behavior is normal without agitation  'All otherwise neg per pt Lab Results  Component Value Date   WBC 5.5 02/20/2019   HGB 13.7 02/20/2019   HCT 41.1 02/20/2019   PLT 157.0 02/20/2019   GLUCOSE 108 (H) 08/22/2019   CHOL 158 02/20/2019   TRIG 60.0 02/20/2019   HDL 58.00 02/20/2019   LDLDIRECT 147.4 01/10/2013   LDLCALC 88 02/20/2019   ALT 15 02/20/2019   AST 21 02/20/2019   NA 140 08/22/2019   K 3.3 (L) 08/22/2019   CL 103 08/22/2019   CREATININE 1.32 08/22/2019   BUN 19 08/22/2019   CO2 28 08/22/2019   TSH 2.79 02/20/2019   PSA 1.62 02/20/2019   INR 1.10 06/10/2018   HGBA1C 5.4 08/22/2019      Assessment & Plan:

## 2020-02-20 NOTE — Progress Notes (Signed)
Subjective:   AMOUS CREWE is a 69 y.o. male who presents for Medicare Annual/Subsequent preventive examination.  Review of Systems:  No ROS.  Medicare Wellness Visit Cardiac Risk Factors include: advanced age (>51men, >55 women);dyslipidemia;male gender;obesity (BMI >30kg/m2)     Objective:    Vitals: BP 120/80   Pulse 66   Temp 98.3 F (36.8 C)   Resp 16   Ht 5\' 10"  (1.778 m)   Wt 214 lb 12.8 oz (97.4 kg)   SpO2 96%   BMI 30.82 kg/m   Body mass index is 30.82 kg/m.  Advanced Directives 02/20/2020 06/10/2018 12/26/2017  Does Patient Have a Medical Advance Directive? No No No  Would patient like information on creating a medical advance directive? No - Patient declined No - Patient declined No - Patient declined    Tobacco Social History   Tobacco Use  Smoking Status Never Smoker  Smokeless Tobacco Never Used     Counseling given: No   Clinical Intake:  Pre-visit preparation completed: Yes  Pain : No/denies pain Pain Score: 0-No pain     BMI - recorded: 30.82 Nutritional Status: BMI > 30  Obese Nutritional Risks: None Diabetes: No  How often do you need to have someone help you when you read instructions, pamphlets, or other written materials from your doctor or pharmacy?: 1 - Never What is the last grade level you completed in school?: Associate's Degree ([employer])  Interpreter Needed?: No  Information entered by :: Nuala Chiles N. Lowell Guitar, LPN  Past Medical History:  Diagnosis Date  . Arthritis    Neck, bilateral hands  . BACK PAIN, CHRONIC 10/31/2010  . Cervical stenosis of spine   . Chronic lumbar radiculopathy 05/20/2015  . Diastolic dysfunction 5/63/8937  . Family history of colon cancer 2013/02/07   Father died at 48yo  . HYPERLIPIDEMIA 09/02/2007  . Impaired glucose tolerance 08/29/2011  . PARESTHESIA 09/02/2007  . Toxic effect of chlorine gas(987.6) 10/31/2010   Past Surgical History:  Procedure Laterality Date  . BUNIONECTOMY    .  COLONOSCOPY    . FOOT SURGERY Right   . LUMBAR LAMINECTOMY/DECOMPRESSION MICRODISCECTOMY N/A 06/20/2018   Procedure: Laminectomy and Foraminotomy - Lumbar one-Lumbar two - Lumbar two-Lumbar three - Lumbar three-Lumbar four - Lumbar four-Lumbar five;  Surgeon: Eustace Moore, MD;  Location: [place];  Service: Neurosurgery;  Laterality: N/A;  . mass removal     back, forehead; benign (lipoma)   . mass removal  2011   head; benign  . POSTERIOR CERVICAL FUSION/FORAMINOTOMY N/A 01/03/2018   Procedure: Posterior Cervical Fusion with lateral mass fixation - Cervical three - Cervical seven, cervical laminectomy Cervical three-cervical seven;  Surgeon: Eustace Moore, MD;  Location: [place];  Service: Neurosurgery;  Laterality: N/A;  . ROOT CANAL    . s/p lipoma right scalp posteriorly  2011   Family History  Problem Relation Age of Onset  . Cancer Mother        colon cancer  . Colon cancer Father 19  . Stroke Brother 51  . Esophageal cancer Neg Hx   . Stomach cancer Neg Hx    Social History   Socioeconomic History  . Marital status: Married    Spouse name: Not on file  . Number of children: Not on file  . Years of education: Not on file  . Highest education level: Not on file  Occupational History  . Occupation: Scientist, water quality: [employer]  Tobacco Use  .  Smoking status: Never Smoker  . Smokeless tobacco: Never Used  Vaping Use  . Vaping Use: Never used  Substance and Sexual Activity  . Alcohol use: Yes    Alcohol/week: 0.0 standard drinks    Comment: occasional  . Drug use: No  . Sexual activity: Yes  Other Topics Concern  . Not on file  Social History Narrative  . Not on file   Social Determinants of Health   Financial Resource Strain:   . Difficulty of Paying Living Expenses:   Food Insecurity:   . Worried About Charity fundraiser in the Last Year:   . Arboriculturist in the Last Year:   Transportation Needs:   . Film/video editor (Medical):   Marland Kitchen Lack of  Transportation (Non-Medical):   Physical Activity:   . Days of Exercise per Week:   . Minutes of Exercise per Session:   Stress:   . Feeling of Stress :   Social Connections:   . Frequency of Communication with Friends and Family:   . Frequency of Social Gatherings with Friends and Family:   . Attends Religious Services:   . Active Member of Clubs or Organizations:   . Attends Archivist Meetings:   Marland Kitchen Marital Status:     Outpatient Encounter Medications as of 02/20/2020  Medication Sig  . aspirin EC 81 MG tablet Take 81 mg by mouth daily.  . furosemide (LASIX) 20 MG tablet Take 1 tablet (20 mg total) by mouth daily.  Marland Kitchen ibuprofen (ADVIL) 600 MG tablet Take 1 tablet (600 mg total) by mouth every 8 (eight) hours as needed.  . methocarbamol (ROBAXIN) 500 MG tablet Take 1 tablet (500 mg total) by mouth every 6 (six) hours as needed for muscle spasms.  . potassium chloride (KLOR-CON 10) 10 MEQ tablet Take 2 tablets (20 mEq total) by mouth daily.  . rosuvastatin (CRESTOR) 20 MG tablet TAKE 1 TABLET(20 MG) BY MOUTH DAILY  . sildenafil (VIAGRA) 100 MG tablet Take 0.5-1 tablets (50-100 mg total) by mouth daily as needed for erectile dysfunction.  . [DISCONTINUED] Vitamin D, Ergocalciferol, (DRISDOL) 1.25 MG (50000 UT) CAPS capsule Take 1 capsule (50,000 Units total) by mouth every 7 (seven) days. (Patient not taking: Reported on 02/20/2020)   No facility-administered encounter medications on file as of 02/20/2020.    Activities of Daily Living In your present state of health, do you have any difficulty performing the following activities: 02/20/2020  Hearing? N  Vision? N  Difficulty concentrating or making decisions? N  Walking or climbing stairs? N  Dressing or bathing? N  Doing errands, shopping? N  Preparing Food and eating ? N  Using the Toilet? N  In the past six months, have you accidently leaked urine? N  Do you have problems with loss of bowel control? N  Managing your  Medications? N  Managing your Finances? N  Housekeeping or managing your Housekeeping? N  Some recent data might be hidden    Patient Care Team: Biagio Borg, MD as PCP - General   Assessment:   This is a routine wellness examination for Daelyn.  Exercise Activities and Dietary recommendations Current Exercise Habits: Home exercise routine, Type of exercise: walking (walks in the neighborhood), Time (Minutes): 30, Frequency (Times/Week): 5, Weekly Exercise (Minutes/Week): 150, Intensity: Moderate, Exercise limited by: neurologic condition(s) (spinal surgery)  Goals    .  Client understands the importance of follow-up with providers by attending scheduled visits    .  Patient Stated (pt-stated)      To maintain my current health status by continuing to eat healthy and stay physically active & socially active.       Fall Risk Fall Risk  02/20/2020 02/20/2019 02/15/2018 08/22/2017 01/02/2017  Falls in the past year? 0 0 No No Yes  Number falls in past yr: 0 - - - 1  Injury with Fall? 0 - - - No  Risk for fall due to : No Fall Risks - - - -  Follow up Falls evaluation completed - - - -   Is the patient's home free of loose throw rugs in walkways, pet beds, electrical cords, etc?   yes      Grab bars in the bathroom? no      Handrails on the stairs?   yes      Adequate lighting?   yes  Timed Get Up and Go Performed: not indicated  Depression Screen PHQ 2/9 Scores 02/20/2020 02/20/2019 02/15/2018 08/22/2017  PHQ - 2 Score 0 0 0 1    Cognitive Function     6CIT Screen 02/20/2020  What Year? 0 points  What month? 0 points  What time? 0 points  Count back from 20 0 points  Months in reverse 0 points  Repeat phrase 0 points  Total Score 0    Immunization History  Administered Date(s) Administered  . Influenza Split 09/08/2011  . Influenza Whole 06/12/2007  . Influenza, High Dose Seasonal PF 05/20/2015, 08/22/2017, 05/17/2018  . Influenza-Unspecified 06/10/2019  .  Pneumococcal Conjugate-13 01/29/2015  . Pneumococcal Polysaccharide-23 08/22/2017  . Td 01/04/2009  . Tdap 02/20/2019  . Zoster 02/05/2014    Qualifies for Shingles Vaccine? Yes  Screening Tests Health Maintenance  Topic Date Due  . COVID-19 Vaccine (1) Never done  . INFLUENZA VACCINE  04/11/2020  . COLONOSCOPY  08/21/2021  . TETANUS/TDAP  02/19/2029  . Hepatitis C Screening  Completed  . PNA vac Low Risk Adult  Completed   Cancer Screenings: Lung: Low Dose CT Chest recommended if Age 54-80 years, 30 pack-year currently smoking OR have quit w/in 15years. Patient does qualify. Colorectal: Yes  Additional Screenings: Hepatitis C Screening: completed      Plan:     Reviewed health maintenance screenings with patient today and relevant education, vaccines, and/or referrals were provided.    Continue doing brain stimulating activities (puzzles, reading, adult coloring books, staying active) to keep memory sharp.    Continue to eat heart healthy diet (full of fruits, vegetables, whole grains, lean protein, water--limit salt, fat, and sugar intake) and increase physical activity as tolerated.  I have personally reviewed and noted the following in the patient's chart:   . Medical and social history . Use of alcohol, tobacco or illicit drugs  . Current medications and supplements . Functional ability and status . Nutritional status . Physical activity . Advanced directives . List of other physicians . Hospitalizations, surgeries, and ER visits in previous 12 months . Vitals . Screenings to include cognitive, depression, and falls . Referrals and appointments  In addition, I have reviewed and discussed with patient certain preventive protocols, quality metrics, and best practice recommendations. A written personalized care plan for preventive services as well as general preventive health recommendations were provided to patient.     Sheral Flow, LPN  9/62/9528    Nurse Health Advisor

## 2020-02-20 NOTE — Patient Instructions (Signed)

## 2020-02-22 ENCOUNTER — Encounter: Payer: Self-pay | Admitting: Internal Medicine

## 2020-02-22 NOTE — Assessment & Plan Note (Signed)
stable overall by history and exam, recent data reviewed with pt, and pt to continue medical treatment as before,  to f/u any worsening symptoms or concerns  

## 2020-02-22 NOTE — Assessment & Plan Note (Signed)
Cont oral replacement 

## 2020-02-22 NOTE — Assessment & Plan Note (Addendum)
stable overall by history and exam, recent data reviewed with pt, and pt to continue medical treatment as before,  to f/u any worsening symptoms or concerns  I spent 31 minutes in preparing to see the patient by review of recent labs, imaging and procedures, obtaining and reviewing separately obtained history, communicating with the patient and family or caregiver, ordering medications, tests or procedures, and documenting clinical information in the EHR including the differential Dx, treatment, and any further evaluation and other management of hyperglycemia, hld, ckd, vit d deficiency

## 2020-02-23 LAB — HEMOGLOBIN A1C: Hgb A1c MFr Bld: 5.2 % (ref 4.6–6.5)

## 2020-03-04 ENCOUNTER — Other Ambulatory Visit: Payer: Self-pay | Admitting: Internal Medicine

## 2020-03-04 NOTE — Telephone Encounter (Signed)
Please refill as per office routine med refill policy (all routine meds refilled for 3 mo or monthly per pt preference up to one year from last visit, then month to month grace period for 3 mo, then further med refills will have to be denied)  

## 2020-03-12 ENCOUNTER — Other Ambulatory Visit: Payer: Self-pay

## 2020-03-12 MED ORDER — METHOCARBAMOL 500 MG PO TABS: 500.0000 mg | ORAL_TABLET | Freq: Three times a day (TID) | ORAL | 1 refills | Status: DC | PRN

## 2020-03-12 NOTE — Telephone Encounter (Signed)
New message    Need prior authorization methocarbamol (ROBAXIN) 500 MG tablet  [place], [place]

## 2020-03-12 NOTE — Telephone Encounter (Signed)
Sent to Dr. John. 

## 2020-03-12 NOTE — Telephone Encounter (Signed)
Done erx 

## 2020-03-16 ENCOUNTER — Telehealth: Payer: Self-pay | Admitting: Internal Medicine

## 2020-03-16 MED ORDER — TIZANIDINE HCL 2 MG PO TABS: 2.0000 mg | ORAL_TABLET | Freq: Three times a day (TID) | ORAL | 1 refills | Status: DC | PRN

## 2020-03-16 NOTE — Telephone Encounter (Signed)
Sent to Dr. John to advise. 

## 2020-03-16 NOTE — Telephone Encounter (Signed)
Ona for change robaxin to tizanidine prn

## 2020-03-16 NOTE — Telephone Encounter (Signed)
methocarbamol (ROBAXIN) 500 MG tablet Insurance would not fill medication, Pharmacy suggested TIZANIDINE tablet or cov TIZANIDINE tablet 2 mg or CYCLOBENZAPR tablet 5 mg   [facility] [place], [place] AT [address] Phone:  [phone]  Fax:  [phone]

## 2020-03-17 NOTE — Telephone Encounter (Signed)
Spoke with pt and informed him that it is ok for the pharmacy to change the rx for Robaxin to Tizanidine prn.  **Called pharmacy and informed the pharm tech that it is ok to change the rx from Robaxin to Tizanidine prn.

## 2020-04-19 DIAGNOSIS — M5136 Other intervertebral disc degeneration, lumbar region: Secondary | ICD-10-CM | POA: Diagnosis not present

## 2020-04-19 DIAGNOSIS — M2550 Pain in unspecified joint: Secondary | ICD-10-CM | POA: Diagnosis not present

## 2020-04-19 DIAGNOSIS — M19041 Primary osteoarthritis, right hand: Secondary | ICD-10-CM | POA: Diagnosis not present

## 2020-04-19 DIAGNOSIS — M19031 Primary osteoarthritis, right wrist: Secondary | ICD-10-CM | POA: Diagnosis not present

## 2020-04-19 DIAGNOSIS — M069 Rheumatoid arthritis, unspecified: Secondary | ICD-10-CM | POA: Diagnosis not present

## 2020-04-19 DIAGNOSIS — M1990 Unspecified osteoarthritis, unspecified site: Secondary | ICD-10-CM | POA: Diagnosis not present

## 2020-04-19 DIAGNOSIS — R789 Finding of unspecified substance, not normally found in blood: Secondary | ICD-10-CM | POA: Diagnosis not present

## 2020-04-19 DIAGNOSIS — E785 Hyperlipidemia, unspecified: Secondary | ICD-10-CM | POA: Diagnosis not present

## 2020-04-19 DIAGNOSIS — M79642 Pain in left hand: Secondary | ICD-10-CM | POA: Diagnosis not present

## 2020-04-19 DIAGNOSIS — M79641 Pain in right hand: Secondary | ICD-10-CM | POA: Diagnosis not present

## 2020-04-19 DIAGNOSIS — M19032 Primary osteoarthritis, left wrist: Secondary | ICD-10-CM | POA: Diagnosis not present

## 2020-04-19 DIAGNOSIS — M19042 Primary osteoarthritis, left hand: Secondary | ICD-10-CM | POA: Diagnosis not present

## 2020-04-19 DIAGNOSIS — M5030 Other cervical disc degeneration, unspecified cervical region: Secondary | ICD-10-CM | POA: Diagnosis not present

## 2020-06-07 ENCOUNTER — Other Ambulatory Visit: Payer: Self-pay | Admitting: Internal Medicine

## 2020-07-07 DIAGNOSIS — Z23 Encounter for immunization: Secondary | ICD-10-CM | POA: Diagnosis not present

## 2020-09-12 ENCOUNTER — Other Ambulatory Visit: Payer: Self-pay | Admitting: Internal Medicine

## 2020-09-12 NOTE — Telephone Encounter (Signed)
Please refill as per office routine med refill policy (all routine meds refilled for 3 mo or monthly per pt preference up to one year from last visit, then month to month grace period for 3 mo, then further med refills will have to be denied)  

## 2020-09-14 DIAGNOSIS — Z03818 Encounter for observation for suspected exposure to other biological agents ruled out: Secondary | ICD-10-CM | POA: Diagnosis not present

## 2020-09-30 ENCOUNTER — Telehealth: Payer: Self-pay | Admitting: Internal Medicine

## 2020-09-30 NOTE — Telephone Encounter (Signed)
Needs rov 

## 2020-09-30 NOTE — Telephone Encounter (Signed)
Patient called and was wondering if he could take more than one furosemide (LASIX) 20 MG tablet. He said that he is having more swelling in his ankles and feet. He can be reached at [phone]. He was also wondering if this medication could cause fatigue.

## 2020-10-01 ENCOUNTER — Telehealth: Payer: Self-pay

## 2020-10-01 NOTE — Telephone Encounter (Signed)
Followed up with patient in regards to leg swelling and him wanting to increase lasix....gave him the providers sugguestion. Patient states swelling has went down. He declines an office visit.

## 2020-12-21 ENCOUNTER — Encounter: Payer: Self-pay | Admitting: Internal Medicine

## 2020-12-21 ENCOUNTER — Ambulatory Visit (INDEPENDENT_AMBULATORY_CARE_PROVIDER_SITE_OTHER): Payer: Medicare Other | Admitting: Internal Medicine

## 2020-12-21 ENCOUNTER — Telehealth: Payer: Self-pay | Admitting: Internal Medicine

## 2020-12-21 ENCOUNTER — Other Ambulatory Visit: Payer: Self-pay

## 2020-12-21 VITALS — BP 120/78 | HR 72 | Temp 98.5°F | Ht 70.0 in | Wt 216.0 lb

## 2020-12-21 DIAGNOSIS — R972 Elevated prostate specific antigen [PSA]: Secondary | ICD-10-CM | POA: Diagnosis not present

## 2020-12-21 DIAGNOSIS — E7800 Pure hypercholesterolemia, unspecified: Secondary | ICD-10-CM | POA: Diagnosis not present

## 2020-12-21 DIAGNOSIS — E559 Vitamin D deficiency, unspecified: Secondary | ICD-10-CM | POA: Diagnosis not present

## 2020-12-21 DIAGNOSIS — Z1211 Encounter for screening for malignant neoplasm of colon: Secondary | ICD-10-CM

## 2020-12-21 DIAGNOSIS — N1831 Chronic kidney disease, stage 3a: Secondary | ICD-10-CM

## 2020-12-21 DIAGNOSIS — R7302 Impaired glucose tolerance (oral): Secondary | ICD-10-CM | POA: Diagnosis not present

## 2020-12-21 DIAGNOSIS — M5136 Other intervertebral disc degeneration, lumbar region: Secondary | ICD-10-CM | POA: Insufficient documentation

## 2020-12-21 DIAGNOSIS — E538 Deficiency of other specified B group vitamins: Secondary | ICD-10-CM | POA: Diagnosis not present

## 2020-12-21 DIAGNOSIS — M069 Rheumatoid arthritis, unspecified: Secondary | ICD-10-CM | POA: Insufficient documentation

## 2020-12-21 DIAGNOSIS — M1990 Unspecified osteoarthritis, unspecified site: Secondary | ICD-10-CM | POA: Insufficient documentation

## 2020-12-21 LAB — URINALYSIS, ROUTINE W REFLEX MICROSCOPIC
Bilirubin Urine: NEGATIVE
Leukocytes,Ua: NEGATIVE
Nitrite: NEGATIVE
Specific Gravity, Urine: 1.025 (ref 1.000–1.030)
Urine Glucose: NEGATIVE
Urobilinogen, UA: 0.2 (ref 0.0–1.0)
pH: 5.5 (ref 5.0–8.0)

## 2020-12-21 LAB — LIPID PANEL
Cholesterol: 133 mg/dL (ref 0–200)
HDL: 53.4 mg/dL (ref >39.00)
LDL Cholesterol: 69 mg/dL (ref 0–99)
NonHDL: 79.48
Total CHOL/HDL Ratio: 2
Triglycerides: 54 mg/dL (ref 0.0–149.0)
VLDL: 10.8 mg/dL (ref 0.0–40.0)

## 2020-12-21 LAB — BASIC METABOLIC PANEL
BUN: 15 mg/dL (ref 6–23)
CO2: 26 mEq/L (ref 19–32)
Calcium: 9.1 mg/dL (ref 8.4–10.5)
Chloride: 105 mEq/L (ref 96–112)
Creatinine, Ser: 1.34 mg/dL (ref 0.40–1.50)
GFR: 53.2 mL/min — ABNORMAL LOW (ref >60.00)
Glucose, Bld: 84 mg/dL (ref 70–99)
Potassium: 3.9 mEq/L (ref 3.5–5.1)
Sodium: 141 mEq/L (ref 135–145)

## 2020-12-21 LAB — HEPATIC FUNCTION PANEL
ALT: 14 U/L (ref 0–53)
AST: 19 U/L (ref 0–37)
Albumin: 3.9 g/dL (ref 3.5–5.2)
Alkaline Phosphatase: 77 U/L (ref 39–117)
Bilirubin, Direct: 0.2 mg/dL (ref 0.0–0.3)
Total Bilirubin: 0.9 mg/dL (ref 0.2–1.2)
Total Protein: 6.7 g/dL (ref 6.0–8.3)

## 2020-12-21 LAB — CBC WITH DIFFERENTIAL/PLATELET
Basophils Absolute: 0 10*3/uL (ref 0.0–0.1)
Basophils Relative: 0.7 % (ref 0.0–3.0)
Eosinophils Absolute: 0.3 10*3/uL (ref 0.0–0.7)
Eosinophils Relative: 4.6 % (ref 0.0–5.0)
HCT: 40.8 % (ref 39.0–52.0)
Hemoglobin: 13.7 g/dL (ref 13.0–17.0)
Lymphocytes Relative: 32.3 % (ref 12.0–46.0)
Lymphs Abs: 2.2 10*3/uL (ref 0.7–4.0)
MCHC: 33.5 g/dL (ref 30.0–36.0)
MCV: 90.2 fl (ref 78.0–100.0)
Monocytes Absolute: 0.6 10*3/uL (ref 0.1–1.0)
Monocytes Relative: 9.1 % (ref 3.0–12.0)
Neutro Abs: 3.7 10*3/uL (ref 1.4–7.7)
Neutrophils Relative %: 53.3 % (ref 43.0–77.0)
Platelets: 182 10*3/uL (ref 150.0–400.0)
RBC: 4.53 Mil/uL (ref 4.22–5.81)
RDW: 13.1 % (ref 11.5–15.5)
WBC: 6.9 10*3/uL (ref 4.0–10.5)

## 2020-12-21 LAB — VITAMIN D 25 HYDROXY (VIT D DEFICIENCY, FRACTURES): VITD: 33.63 ng/mL (ref 30.00–100.00)

## 2020-12-21 LAB — HEMOGLOBIN A1C: Hgb A1c MFr Bld: 5.5 % (ref 4.6–6.5)

## 2020-12-21 LAB — TSH: TSH: 2.89 u[IU]/mL (ref 0.35–4.50)

## 2020-12-21 LAB — PSA: PSA: 2.01 ng/mL (ref 0.10–4.00)

## 2020-12-21 LAB — VITAMIN B12: Vitamin B-12: 458 pg/mL (ref 211–911)

## 2020-12-21 NOTE — Progress Notes (Signed)
Patient ID: Matthew Tucker, male   DOB: 1950/10/15, 70 y.o.   MRN: [id number]         Chief Complaint:: yearly exam and Follow-up  low vit d, hyperglycemia, elevated psa, hld, ckd       HPI:  Matthew Tucker is a 70 y.o. male and plans to have second booster soon; due for colonoscopy o/w up to date with preventive referrals and immunizations  Denies urinary symptoms such as dysuria, frequency, urgency, flank pain, hematuria or n/v, fever, chills.   Pt denies polydipsia, polyuria,   Pt states overall good compliance with meds, trying to follow lower cholesterol diet, wt overall stable but little exercise however. Not taking Vit D.  Denies new focal neuro s/s.   Pt denies fever, wt loss, night sweats, loss of appetite, or other constitutional symptoms  No other new complaints    Wt Readings from Last 3 Encounters:  12/21/20 216 lb (98 kg)  02/20/20 214 lb (97.1 kg)  02/20/20 214 lb 12.8 oz (97.4 kg)   BP Readings from Last 3 Encounters:  12/21/20 120/78  02/20/20 120/78  02/20/20 120/80   Immunization History  Administered Date(s) Administered  . Influenza Split 09/08/2011  . Influenza Whole 06/12/2007  . Influenza, High Dose Seasonal PF 05/20/2015, 08/22/2017, 05/17/2018  . Influenza-Unspecified 06/10/2019  . PFIZER(Purple Top)SARS-COV-2 Vaccination 11/10/2019, 12/11/2019, 06/21/2020  . Pneumococcal Conjugate-13 01/29/2015  . Pneumococcal Polysaccharide-23 08/22/2017  . Td 01/04/2009  . Tdap 02/20/2019  . Zoster 02/05/2014  There are no preventive care reminders to display for this patient.    Past Medical History:  Diagnosis Date  . Arthritis    Neck, bilateral hands  . BACK PAIN, CHRONIC 10/31/2010  . Cervical stenosis of spine   . Chronic lumbar radiculopathy 05/20/2015  . Diastolic dysfunction 03/04/7627  . Family history of colon cancer 2013-02-13   Father died at 56yo  . HYPERLIPIDEMIA 09/02/2007  . Impaired glucose tolerance 08/29/2011  . PARESTHESIA 09/02/2007  . Toxic  effect of chlorine gas(987.6) 10/31/2010   Past Surgical History:  Procedure Laterality Date  . BUNIONECTOMY    . COLONOSCOPY    . FOOT SURGERY Right   . LUMBAR LAMINECTOMY/DECOMPRESSION MICRODISCECTOMY N/A 06/20/2018   Procedure: Laminectomy and Foraminotomy - Lumbar one-Lumbar two - Lumbar two-Lumbar three - Lumbar three-Lumbar four - Lumbar four-Lumbar five;  Surgeon: Eustace Moore, MD;  Location: [place];  Service: Neurosurgery;  Laterality: N/A;  . mass removal     back, forehead; benign (lipoma)   . mass removal  2011   head; benign  . POSTERIOR CERVICAL FUSION/FORAMINOTOMY N/A 01/03/2018   Procedure: Posterior Cervical Fusion with lateral mass fixation - Cervical three - Cervical seven, cervical laminectomy Cervical three-cervical seven;  Surgeon: Eustace Moore, MD;  Location: [place];  Service: Neurosurgery;  Laterality: N/A;  . ROOT CANAL    . s/p lipoma right scalp posteriorly  2011    reports that he has never smoked. He has never used smokeless tobacco. He reports current alcohol use. He reports that he does not use drugs. family history includes Cancer in his mother; Colon cancer (age of onset: 10) in his father; Stroke (age of onset: 78) in his brother. Allergies  Allergen Reactions  . Other Other (See Comments)    Nivaquine. Found in [place] and [place] for Malaria treatment  UNSPECIFIED REACTION   . Levofloxacin Other (See Comments)    Irritates gums   Current Outpatient Medications on File Prior to Visit  Medication Sig Dispense Refill  . aspirin EC 81 MG tablet Take 81 mg by mouth daily.    . furosemide (LASIX) 20 MG tablet TAKE 1 TABLET(20 MG) BY MOUTH DAILY AS NEEDED 90 tablet 3  . ibuprofen (ADVIL) 600 MG tablet Take 1 tablet (600 mg total) by mouth every 8 (eight) hours as needed. 60 tablet 2  . potassium chloride (KLOR-CON) 10 MEQ tablet TAKE 2 TABLETS(20 MEQ) BY MOUTH DAILY 180 tablet 3  . rosuvastatin (CRESTOR) 20 MG tablet TAKE 1 TABLET(20 MG) BY MOUTH DAILY 90  tablet 1  . sildenafil (VIAGRA) 100 MG tablet Take 0.5-1 tablets (50-100 mg total) by mouth daily as needed for erectile dysfunction. 10 tablet 11  . tiZANidine (ZANAFLEX) 2 MG tablet Take 1 tablet (2 mg total) by mouth every 8 (eight) hours as needed for muscle spasms. 60 tablet 1  . methocarbamol (ROBAXIN) 500 MG tablet 1.5 tablets     No current facility-administered medications on file prior to visit.        ROS:  All others reviewed and negative.  Objective        PE:  BP 120/78 (BP Location: Left Arm, Patient Position: Sitting, Cuff Size: Large)   Pulse 72   Temp 98.5 F (36.9 C) (Oral)   Ht 5\' 10"  (1.778 m)   Wt 216 lb (98 kg)   SpO2 98%   BMI 30.99 kg/m                 Constitutional: Pt appears in NAD               HENT: Head: NCAT.                Right Ear: External ear normal.                 Left Ear: External ear normal.                Eyes: . Pupils are equal, round, and reactive to light. Conjunctivae and EOM are normal               Nose: without d/c or deformity               Neck: Neck supple. Gross normal ROM               Cardiovascular: Normal rate and regular rhythm.                 Pulmonary/Chest: Effort normal and breath sounds without rales or wheezing.                Abd:  Soft, NT, ND, + BS, no organomegaly               Neurological: Pt is alert. At baseline orientation, motor grossly intact               Skin: Skin is warm. No rashes, no other new lesions, LE edema - none               Psychiatric: Pt behavior is normal without agitation   Micro: none  Cardiac tracings I have personally interpreted today:  none  Pertinent Radiological findings (summarize): none   Lab Results  Component Value Date   WBC 6.9 12/21/2020   HGB 13.7 12/21/2020   HCT 40.8 12/21/2020   PLT 182.0 12/21/2020   GLUCOSE 84 12/21/2020   CHOL 133 12/21/2020   TRIG 54.0 12/21/2020   HDL  53.40 12/21/2020   LDLDIRECT 147.4 01/10/2013   LDLCALC 69 12/21/2020   ALT 14  12/21/2020   AST 19 12/21/2020   NA 141 12/21/2020   K 3.9 12/21/2020   CL 105 12/21/2020   CREATININE 1.34 12/21/2020   BUN 15 12/21/2020   CO2 26 12/21/2020   TSH 2.89 12/21/2020   PSA 2.01 12/21/2020   INR 1.10 06/10/2018   HGBA1C 5.5 12/21/2020   Assessment/Plan:  Matthew Tucker is a 70 y.o. Black or African American [2] male with  has a past medical history of Arthritis, BACK PAIN, CHRONIC (10/31/2010), Cervical stenosis of spine, Chronic lumbar radiculopathy (02/17/880), Diastolic dysfunction (09/13/1592), Family history of colon cancer (01/17/2013), HYPERLIPIDEMIA (09/02/2007), Impaired glucose tolerance (08/29/2011), PARESTHESIA (09/02/2007), and Toxic effect of chlorine gas(987.6) (10/31/2010).  Vitamin D deficiency Last vitamin D Lab Results  Component Value Date   VD25OH 33.63 12/21/2020   Stable, cont oral replacement  Increased prostate specific antigen (PSA) velocity Overall stable, for f/u PSA, consider urology referral  Impaired glucose tolerance Lab Results  Component Value Date   HGBA1C 5.5 12/21/2020   Stable, pt to continue current medical treatment  - diet, wt control   Hyperlipidemia Lab Results  Component Value Date   LDLCALC 69 12/21/2020   Stable, pt to continue current statin crestor 20   CKD (chronic kidney disease) stage 3, GFR 30-59 ml/min (HCC) Lab Results  Component Value Date   CREATININE 1.34 12/21/2020   Stable overall, cont to avoid nephrotoxins  Followup: Return in about 1 year (around 12/21/2021).  Cathlean Cower, MD 12/26/2020 5:21 AM [facility]

## 2020-12-21 NOTE — Telephone Encounter (Signed)
Ok to let pt know  O positive  Done apr 2019

## 2020-12-21 NOTE — Telephone Encounter (Signed)
Patient had a question about how he could figure out his blood type. He said when he went down to the lab they could not tell him.

## 2020-12-21 NOTE — Patient Instructions (Addendum)
You will be contacted regarding the referral for: colonoscopy  Please continue all other medications as before, and refills have been done if requested.  Please have the pharmacy call with any other refills you may need.  Please continue your efforts at being more active, low cholesterol diet, and weight control.  You are otherwise up to date with prevention measures today.  Please keep your appointments with your specialists as you may have planned  Please go to the LAB at the blood drawing area for the tests to be done  You will be contacted by phone if any changes need to be made immediately.  Otherwise, you will receive a letter about your results with an explanation, but please check with MyChart first.  Please remember to sign up for MyChart if you have not done so, as this will be important to you in the future with finding out test results, communicating by private email, and scheduling acute appointments online when needed.  Please make an Appointment to return in 1 year, or sooner if needed

## 2020-12-21 NOTE — Telephone Encounter (Signed)
Patient notified of his blood type

## 2020-12-26 ENCOUNTER — Encounter: Payer: Self-pay | Admitting: Internal Medicine

## 2020-12-26 NOTE — Assessment & Plan Note (Signed)
Lab Results  Component Value Date   CREATININE 1.34 12/21/2020   Stable overall, cont to avoid nephrotoxins

## 2020-12-26 NOTE — Assessment & Plan Note (Signed)
Lab Results  Component Value Date   LDLCALC 69 12/21/2020   Stable, pt to continue current statin crestor 20

## 2020-12-26 NOTE — Assessment & Plan Note (Signed)
Overall stable, for f/u PSA, consider urology referral

## 2020-12-26 NOTE — Assessment & Plan Note (Signed)
Lab Results  Component Value Date   HGBA1C 5.5 12/21/2020   Stable, pt to continue current medical treatment  - diet, wt control

## 2020-12-26 NOTE — Assessment & Plan Note (Signed)
Last vitamin D Lab Results  Component Value Date   VD25OH 33.63 12/21/2020   Stable, cont oral replacement

## 2021-01-19 ENCOUNTER — Other Ambulatory Visit: Payer: Self-pay

## 2021-01-19 MED ORDER — ROSUVASTATIN CALCIUM 20 MG PO TABS: ORAL_TABLET | ORAL | 1 refills | Status: DC

## 2021-02-15 ENCOUNTER — Other Ambulatory Visit: Payer: Self-pay | Admitting: Internal Medicine

## 2021-02-17 DIAGNOSIS — N5201 Erectile dysfunction due to arterial insufficiency: Secondary | ICD-10-CM | POA: Diagnosis not present

## 2021-02-17 DIAGNOSIS — N400 Enlarged prostate without lower urinary tract symptoms: Secondary | ICD-10-CM | POA: Diagnosis not present

## 2021-02-23 ENCOUNTER — Telehealth: Payer: Self-pay | Admitting: Internal Medicine

## 2021-02-23 NOTE — Telephone Encounter (Signed)
Patient/spouse calling for information listed in medical record. Patient recently applied for additional insurance coverage and was denied due to "dilated aortic root" diagnosis The patient would like to when this diagnosis was given and why.

## 2021-02-23 NOTE — Telephone Encounter (Signed)
Very sorry, this is not an easy explantation by email, but please remember to bring it up at the next office visit

## 2021-02-24 NOTE — Telephone Encounter (Signed)
Patient notified of Dr. Jenny Reichmann last note.

## 2021-03-16 DIAGNOSIS — Z20822 Contact with and (suspected) exposure to covid-19: Secondary | ICD-10-CM | POA: Diagnosis not present

## 2021-04-25 DIAGNOSIS — R789 Finding of unspecified substance, not normally found in blood: Secondary | ICD-10-CM | POA: Diagnosis not present

## 2021-04-25 DIAGNOSIS — M79641 Pain in right hand: Secondary | ICD-10-CM | POA: Diagnosis not present

## 2021-04-25 DIAGNOSIS — M549 Dorsalgia, unspecified: Secondary | ICD-10-CM | POA: Diagnosis not present

## 2021-04-25 DIAGNOSIS — M1990 Unspecified osteoarthritis, unspecified site: Secondary | ICD-10-CM | POA: Diagnosis not present

## 2021-04-25 DIAGNOSIS — M79642 Pain in left hand: Secondary | ICD-10-CM | POA: Diagnosis not present

## 2021-04-25 DIAGNOSIS — M5030 Other cervical disc degeneration, unspecified cervical region: Secondary | ICD-10-CM | POA: Diagnosis not present

## 2021-04-25 DIAGNOSIS — E785 Hyperlipidemia, unspecified: Secondary | ICD-10-CM | POA: Diagnosis not present

## 2021-04-25 DIAGNOSIS — M542 Cervicalgia: Secondary | ICD-10-CM | POA: Diagnosis not present

## 2021-04-25 DIAGNOSIS — M069 Rheumatoid arthritis, unspecified: Secondary | ICD-10-CM | POA: Diagnosis not present

## 2021-04-25 DIAGNOSIS — M5136 Other intervertebral disc degeneration, lumbar region: Secondary | ICD-10-CM | POA: Diagnosis not present

## 2021-06-07 ENCOUNTER — Ambulatory Visit (INDEPENDENT_AMBULATORY_CARE_PROVIDER_SITE_OTHER): Payer: Medicare Other

## 2021-06-07 ENCOUNTER — Other Ambulatory Visit: Payer: Self-pay

## 2021-06-07 ENCOUNTER — Encounter: Payer: Self-pay | Admitting: Internal Medicine

## 2021-06-07 VITALS — BP 122/80 | HR 74 | Temp 98.2°F | Ht 70.0 in | Wt 217.0 lb

## 2021-06-07 DIAGNOSIS — Z23 Encounter for immunization: Secondary | ICD-10-CM

## 2021-06-07 DIAGNOSIS — Z0000 Encounter for general adult medical examination without abnormal findings: Secondary | ICD-10-CM

## 2021-06-07 NOTE — Patient Instructions (Addendum)
Matthew Tucker , Thank you for taking time to come for your Medicare Wellness Visit. I appreciate your ongoing commitment to your health goals. Please review the following plan we discussed and let me know if I can assist you in the future.   Screening recommendations/referrals: Colonoscopy: 08/22/2011; due every 10 years (due 08/21/2021) Recommended yearly ophthalmology/optometry visit for glaucoma screening and checkup Recommended yearly dental visit for hygiene and checkup  Vaccinations: Influenza vaccine: 06/07/2021 Pneumococcal vaccine: 01/29/2015, 08/22/2017 Tdap vaccine: 02/20/2019; due every 10 years (due 02/19/2029) Shingles vaccine: never done   Covid-19: 11/10/2019, 12/11/2019, 06/21/2020, 03/08/2021  Advanced directives: Advance directive discussed with you today. Even though you declined this today please call our office should you change your mind and we can give you the proper paperwork for you to fill out.  Conditions/risks identified: Yes; Client understands the importance of follow-up with providers by attending scheduled visits and discussed goals to eat healthier, increase physical activity, exercise the brain, socialize more, get enough sleep and make time for laughter.  Next appointment: Please schedule your next Medicare Wellness Visit with your Nurse Health Advisor in 1 year by calling [phone].  Preventive Care 60 Years and Older, Male Preventive care refers to lifestyle choices and visits with your health care provider that can promote health and wellness. What does preventive care include? A yearly physical exam. This is also called an annual well check. Dental exams once or twice a year. Routine eye exams. Ask your health care provider how often you should have your eyes checked. Personal lifestyle choices, including: Daily care of your teeth and gums. Regular physical activity. Eating a healthy diet. Avoiding tobacco and drug use. Limiting alcohol use. Practicing  safe sex. Taking low doses of aspirin every day. Taking vitamin and mineral supplements as recommended by your health care provider. What happens during an annual well check? The services and screenings done by your health care provider during your annual well check will depend on your age, overall health, lifestyle risk factors, and family history of disease. Counseling  Your health care provider may ask you questions about your: Alcohol use. Tobacco use. Drug use. Emotional well-being. Home and relationship well-being. Sexual activity. Eating habits. History of falls. Memory and ability to understand (cognition). Work and work Statistician. Screening  You may have the following tests or measurements: Height, weight, and BMI. Blood pressure. Lipid and cholesterol levels. These may be checked every 5 years, or more frequently if you are over 91 years old. Skin check. Lung cancer screening. You may have this screening every year starting at age 63 if you have a 30-pack-year history of smoking and currently smoke or have quit within the past 15 years. Fecal occult blood test (FOBT) of the stool. You may have this test every year starting at age 1. Flexible sigmoidoscopy or colonoscopy. You may have a sigmoidoscopy every 5 years or a colonoscopy every 10 years starting at age 82. Prostate cancer screening. Recommendations will vary depending on your family history and other risks. Hepatitis C blood test. Hepatitis B blood test. Sexually transmitted disease (STD) testing. Diabetes screening. This is done by checking your blood sugar (glucose) after you have not eaten for a while (fasting). You may have this done every 1-3 years. Abdominal aortic aneurysm (AAA) screening. You may need this if you are a current or former smoker. Osteoporosis. You may be screened starting at age 88 if you are at high risk. Talk with your health care provider about your test  results, treatment options, and  if necessary, the need for more tests. Vaccines  Your health care provider may recommend certain vaccines, such as: Influenza vaccine. This is recommended every year. Tetanus, diphtheria, and acellular pertussis (Tdap, Td) vaccine. You may need a Td booster every 10 years. Zoster vaccine. You may need this after age 4. Pneumococcal 13-valent conjugate (PCV13) vaccine. One dose is recommended after age 86. Pneumococcal polysaccharide (PPSV23) vaccine. One dose is recommended after age 41. Talk to your health care provider about which screenings and vaccines you need and how often you need them. This information is not intended to replace advice given to you by your health care provider. Make sure you discuss any questions you have with your health care provider. Document Released: 09/24/2015 Document Revised: 05/17/2016 Document Reviewed: 06/29/2015 Elsevier Interactive Patient Education  2017 [place] Prevention in the Home Falls can cause injuries. They can happen to people of all ages. There are many things you can do to make your home safe and to help prevent falls. What can I do on the outside of my home? Regularly fix the edges of walkways and driveways and fix any cracks. Remove anything that might make you trip as you walk through a door, such as a raised step or threshold. Trim any bushes or trees on the path to your home. Use bright outdoor lighting. Clear any walking paths of anything that might make someone trip, such as rocks or tools. Regularly check to see if handrails are loose or broken. Make sure that both sides of any steps have handrails. Any raised decks and porches should have guardrails on the edges. Have any leaves, snow, or ice cleared regularly. Use sand or salt on walking paths during winter. Clean up any spills in your garage right away. This includes oil or grease spills. What can I do in the bathroom? Use night lights. Install grab bars by the  toilet and in the tub and shower. Do not use towel bars as grab bars. Use non-skid mats or decals in the tub or shower. If you need to sit down in the shower, use a plastic, non-slip stool. Keep the floor dry. Clean up any water that spills on the floor as soon as it happens. Remove soap buildup in the tub or shower regularly. Attach bath mats securely with double-sided non-slip rug tape. Do not have throw rugs and other things on the floor that can make you trip. What can I do in the bedroom? Use night lights. Make sure that you have a light by your bed that is easy to reach. Do not use any sheets or blankets that are too big for your bed. They should not hang down onto the floor. Have a firm chair that has side arms. You can use this for support while you get dressed. Do not have throw rugs and other things on the floor that can make you trip. What can I do in the kitchen? Clean up any spills right away. Avoid walking on wet floors. Keep items that you use a lot in easy-to-reach places. If you need to reach something above you, use a strong step stool that has a grab bar. Keep electrical cords out of the way. Do not use floor polish or wax that makes floors slippery. If you must use wax, use non-skid floor wax. Do not have throw rugs and other things on the floor that can make you trip. What can I do with my stairs?  Do not leave any items on the stairs. Make sure that there are handrails on both sides of the stairs and use them. Fix handrails that are broken or loose. Make sure that handrails are as long as the stairways. Check any carpeting to make sure that it is firmly attached to the stairs. Fix any carpet that is loose or worn. Avoid having throw rugs at the top or bottom of the stairs. If you do have throw rugs, attach them to the floor with carpet tape. Make sure that you have a light switch at the top of the stairs and the bottom of the stairs. If you do not have them, ask someone  to add them for you. What else can I do to help prevent falls? Wear shoes that: Do not have high heels. Have rubber bottoms. Are comfortable and fit you well. Are closed at the toe. Do not wear sandals. If you use a stepladder: Make sure that it is fully opened. Do not climb a closed stepladder. Make sure that both sides of the stepladder are locked into place. Ask someone to hold it for you, if possible. Clearly mark and make sure that you can see: Any grab bars or handrails. First and last steps. Where the edge of each step is. Use tools that help you move around (mobility aids) if they are needed. These include: Canes. Walkers. Scooters. Crutches. Turn on the lights when you go into a dark area. Replace any light bulbs as soon as they burn out. Set up your furniture so you have a clear path. Avoid moving your furniture around. If any of your floors are uneven, fix them. If there are any pets around you, be aware of where they are. Review your medicines with your doctor. Some medicines can make you feel dizzy. This can increase your chance of falling. Ask your doctor what other things that you can do to help prevent falls. This information is not intended to replace advice given to you by your health care provider. Make sure you discuss any questions you have with your health care provider. Document Released: 06/24/2009 Document Revised: 02/03/2016 Document Reviewed: 10/02/2014 Elsevier Interactive Patient Education  2017 [employer].

## 2021-06-07 NOTE — Progress Notes (Addendum)
Subjective:   Matthew Tucker is a 70 y.o. male who presents for Medicare Annual/Subsequent preventive examination.  Review of Systems     Cardiac Risk Factors include: advanced age (>69men, >39 women);dyslipidemia;male gender;obesity (BMI >30kg/m2);sedentary lifestyle     Objective:    Today's Vitals   06/07/21 0850  BP: 122/80  Pulse: 74  Temp: 98.2 F (36.8 C)  SpO2: 98%  Weight: 217 lb (98.4 kg)  Height: 5\' 10"  (1.778 m)  PainSc: 0-No pain   Body mass index is 31.14 kg/m.  Advanced Directives 06/07/2021 02/20/2020 06/10/2018 12/26/2017  Does Patient Have a Medical Advance Directive? No No No No  Would patient like information on creating a medical advance directive? No - Patient declined No - Patient declined No - Patient declined No - Patient declined    Current Medications (verified) Outpatient Encounter Medications as of 06/07/2021  Medication Sig   aspirin EC 81 MG tablet Take 81 mg by mouth daily.   furosemide (LASIX) 20 MG tablet TAKE 1 TABLET(20 MG) BY MOUTH DAILY AS NEEDED   ibuprofen (ADVIL) 600 MG tablet TAKE 1 TABLET(600 MG) BY MOUTH EVERY 8 HOURS AS NEEDED   methocarbamol (ROBAXIN) 500 MG tablet 1.5 tablets   potassium chloride (KLOR-CON) 10 MEQ tablet TAKE 2 TABLETS(20 MEQ) BY MOUTH DAILY   rosuvastatin (CRESTOR) 20 MG tablet TAKE 1 TABLET(20 MG) BY MOUTH DAILY   sildenafil (VIAGRA) 100 MG tablet Take 0.5-1 tablets (50-100 mg total) by mouth daily as needed for erectile dysfunction.   tiZANidine (ZANAFLEX) 2 MG tablet Take 1 tablet (2 mg total) by mouth every 8 (eight) hours as needed for muscle spasms.   No facility-administered encounter medications on file as of 06/07/2021.    Allergies (verified) Other and Levofloxacin   History: Past Medical History:  Diagnosis Date   Arthritis    Neck, bilateral hands   BACK PAIN, CHRONIC 10/31/2010   Cervical stenosis of spine    Chronic lumbar radiculopathy 11/11/6710   Diastolic dysfunction 4/58/0998    Family history of colon cancer 02-03-2013   Father died at 62yo   HYPERLIPIDEMIA 09/19/07   Impaired glucose tolerance 08/29/2011   PARESTHESIA 09-19-2007   Toxic effect of chlorine gas(987.6) 10/31/2010   Past Surgical History:  Procedure Laterality Date   BUNIONECTOMY     COLONOSCOPY     FOOT SURGERY Right    LUMBAR LAMINECTOMY/DECOMPRESSION MICRODISCECTOMY N/A 06/20/2018   Procedure: Laminectomy and Foraminotomy - Lumbar one-Lumbar two - Lumbar two-Lumbar three - Lumbar three-Lumbar four - Lumbar four-Lumbar five;  Surgeon: Eustace Moore, MD;  Location: [facility];  Service: Neurosurgery;  Laterality: N/A;   mass removal     back, forehead; benign (lipoma)    mass removal  2011   head; benign   POSTERIOR CERVICAL FUSION/FORAMINOTOMY N/A 01/03/2018   Procedure: Posterior Cervical Fusion with lateral mass fixation - Cervical three - Cervical seven, cervical laminectomy Cervical three-cervical seven;  Surgeon: Eustace Moore, MD;  Location: [facility];  Service: Neurosurgery;  Laterality: N/A;   ROOT CANAL     s/p lipoma right scalp posteriorly  2011   Family History  Problem Relation Age of Onset   Cancer Mother        colon cancer   Colon cancer Father 27   Stroke Brother 28   Esophageal cancer Neg Hx    Stomach cancer Neg Hx    Social History   Socioeconomic History   Marital status: Married    Spouse name:  Not on file   Number of children: Not on file   Years of education: Not on file   Highest education level: Not on file  Occupational History   Occupation: Geophysical data processor    Employer: [employer]  Tobacco Use   Smoking status: Never   Smokeless tobacco: Never  Vaping Use   Vaping Use: Never used  Substance and Sexual Activity   Alcohol use: Yes    Alcohol/week: 0.0 standard drinks    Comment: occasional   Drug use: No   Sexual activity: Yes  Other Topics Concern   Not on file  Social History Narrative   Not on file   Social Determinants of Health   Financial  Resource Strain: Low Risk    Difficulty of Paying Living Expenses: Not hard at all  Food Insecurity: No Food Insecurity   Worried About Charity fundraiser in the Last Year: Never true   [place] in the Last Year: Never true  Transportation Needs: No Transportation Needs   Lack of Transportation (Medical): No   Lack of Transportation (Non-Medical): No  Physical Activity: Inactive   Days of Exercise per Week: 0 days   Minutes of Exercise per Session: 0 min  Stress: No Stress Concern Present   Feeling of Stress : Not at all  Social Connections: Socially Integrated   Frequency of Communication with Friends and Family: More than three times a week   Frequency of Social Gatherings with Friends and Family: More than three times a week   Attends Religious Services: More than 4 times per year   Active Member of [employer] or Organizations: Yes   Attends Music therapist: More than 4 times per year   Marital Status: Married    Tobacco Counseling Counseling given: Not Answered   Clinical Intake:  Pre-visit preparation completed: Yes  Pain : No/denies pain Pain Score: 0-No pain     BMI - recorded: 31.14 Nutritional Status: BMI > 30  Obese Nutritional Risks: None Diabetes: No  How often do you need to have someone help you when you read instructions, pamphlets, or other written materials from your doctor or pharmacy?: 1 - Never What is the last grade level you completed in school?: Associates Degree from [employer]  Diabetic? no  Interpreter Needed?: No  Information entered by :: Lisette Abu, LPN   Activities of Daily Living In your present state of health, do you have any difficulty performing the following activities: 06/07/2021  Hearing? N  Vision? N  Difficulty concentrating or making decisions? N  Walking or climbing stairs? N  Dressing or bathing? N  Doing errands, shopping? N  Preparing Food and eating ? N  Using the Toilet? N  In the past six  months, have you accidently leaked urine? N  Do you have problems with loss of bowel control? N  Managing your Medications? N  Managing your Finances? N  Housekeeping or managing your Housekeeping? N  Some recent data might be hidden    Patient Care Team: Biagio Borg, MD as PCP - General  Indicate any recent Medical Services you may have received from other than [facility] providers in the past year (date may be approximate).     Assessment:   This is a routine wellness examination for Matthew Tucker.  Hearing/Vision screen Hearing Screening - Comments:: Patient denied any hearing difficulty.   No hearing aids.  Vision Screening - Comments:: Patient wears corrective glasses/contacts.  Eye exam done annually.  Dietary issues and exercise activities discussed: Current Exercise Habits: The patient does not participate in regular exercise at present, Exercise limited by: orthopedic condition(s)   Goals Addressed   None   Depression Screen PHQ 2/9 Scores 06/07/2021 12/21/2020 02/20/2020 02/20/2020 02/20/2019 02/15/2018 08/22/2017  PHQ - 2 Score 0 0 0 0 0 0 1    Fall Risk Fall Risk  06/07/2021 12/21/2020 02/20/2020 02/20/2020 02/20/2019  Falls in the past year? 0 0 0 0 0  Number falls in past yr: 0 - - 0 -  Injury with Fall? 0 - - 0 -  Risk for fall due to : No Fall Risks - - No Fall Risks -  Follow up Falls evaluation completed - - Falls evaluation completed -    FALL RISK PREVENTION PERTAINING TO THE HOME:  Any stairs in or around the home? Yes  If so, are there any without handrails? No  Home free of loose throw rugs in walkways, pet beds, electrical cords, etc? Yes  Adequate lighting in your home to reduce risk of falls? Yes   ASSISTIVE DEVICES UTILIZED TO PREVENT FALLS:  Life alert? No  Use of a cane, walker or w/c? No  Grab bars in the bathroom? No  Shower chair or bench in shower? No  Elevated toilet seat or a handicapped toilet? No   TIMED UP AND GO:  Was the test performed? Yes  .  Length of time to ambulate 10 feet: 5 sec.   Gait steady and fast without use of assistive device  Cognitive Function: Normal cognitive status assessed by direct observation by this Nurse Health Advisor. No abnormalities found.       6CIT Screen 02/20/2020  What Year? 0 points  What month? 0 points  What time? 0 points  Count back from 20 0 points  Months in reverse 0 points  Repeat phrase 0 points  Total Score 0    Immunizations Immunization History  Administered Date(s) Administered   Influenza Split 09/08/2011   Influenza Whole 06/12/2007   Influenza, High Dose Seasonal PF 05/20/2015, 08/22/2017, 05/17/2018   Influenza-Unspecified 06/10/2019   PFIZER(Purple Top)SARS-COV-2 Vaccination 11/10/2019, 12/11/2019, 06/21/2020   Pneumococcal Conjugate-13 01/29/2015   Pneumococcal Polysaccharide-23 08/22/2017   Td 01/04/2009   Tdap 02/20/2019   Zoster, Live 02/05/2014    TDAP status: Up to date  Flu Vaccine status: Completed at today's visit  Pneumococcal vaccine status: Up to date  Covid-19 vaccine status: Completed vaccines  Qualifies for Shingles Vaccine? Yes   Zostavax completed Yes   Shingrix Completed?: No.    Education has been provided regarding the importance of this vaccine. Patient has been advised to call insurance company to determine out of pocket expense if they have not yet received this vaccine. Advised may also receive vaccine at local pharmacy or Health Dept. Verbalized acceptance and understanding.  Screening Tests Health Maintenance  Topic Date Due   Zoster Vaccines- Shingrix (1 of 2) Never done   COVID-19 Vaccine (4 - Booster for Pfizer series) 10/22/2020   INFLUENZA VACCINE  04/11/2021   COLONOSCOPY (Pts 45-46yrs Insurance coverage will need to be confirmed)  08/21/2021   TETANUS/TDAP  02/19/2029   Hepatitis C Screening  Completed   HPV VACCINES  Aged Out    Health Maintenance  Health Maintenance Due  Topic Date Due   Zoster Vaccines-  Shingrix (1 of 2) Never done   COVID-19 Vaccine (4 - Booster for Pfizer series) 10/22/2020   INFLUENZA VACCINE  04/11/2021  Colorectal cancer screening: Type of screening: Colonoscopy. Completed 08/22/2011. Repeat every 10 years  Lung Cancer Screening: (Low Dose CT Chest recommended if Age 33-80 years, 30 pack-year currently smoking OR have quit w/in 15years.) does not qualify.   Lung Cancer Screening Referral: no  Additional Screening:  Hepatitis C Screening: does qualify; Completed: yes  Vision Screening: Recommended annual ophthalmology exams for early detection of glaucoma and other disorders of the eye. Is the patient up to date with their annual eye exam?  Yes  Who is the provider or what is the name of the office in which the patient attends annual eye exams? Dr. Webb Laws If pt is not established with a provider, would they like to be referred to a provider to establish care? No .   Dental Screening: Recommended annual dental exams for proper oral hygiene  Community Resource Referral / Chronic Care Management: CRR required this visit?  No   CCM required this visit?  No      Plan:     I have personally reviewed and noted the following in the patient's chart:   Medical and social history Use of alcohol, tobacco or illicit drugs  Current medications and supplements including opioid prescriptions. Patient is not currently taking opioid prescriptions. Functional ability and status Nutritional status Physical activity Advanced directives List of other physicians Hospitalizations, surgeries, and ER visits in previous 12 months Vitals Screenings to include cognitive, depression, and falls Referrals and appointments  In addition, I have reviewed and discussed with patient certain preventive protocols, quality metrics, and best practice recommendations. A written personalized care plan for preventive services as well as general preventive health recommendations  were provided to patient.     Sheral Flow, LPN   [birth year]   Nurse Notes:  Hearing Screening - Comments:: Patient denied any hearing difficulty.   No hearing aids.  Vision Screening - Comments:: Patient wears corrective glasses/contacts.  Eye exam done annually.    Medical screening examination/treatment/procedure(s) were performed by non-physician practitioner and as supervising physician I was immediately available for consultation/collaboration.  I agree with above. Cathlean Cower, MD

## 2021-07-27 ENCOUNTER — Telehealth: Payer: Self-pay | Admitting: Internal Medicine

## 2021-07-27 DIAGNOSIS — Z1211 Encounter for screening for malignant neoplasm of colon: Secondary | ICD-10-CM

## 2021-07-27 NOTE — Telephone Encounter (Signed)
Ok this referral is done 

## 2021-07-27 NOTE — Telephone Encounter (Signed)
Pt. Called and is requesting a referral to receive his colonoscopy. States his wife has had a referral sent, since last provider has retired.    Please advise.   Callback #- [phone]

## 2021-07-28 NOTE — Telephone Encounter (Signed)
Patient notified

## 2021-08-10 ENCOUNTER — Encounter: Payer: Self-pay | Admitting: Gastroenterology

## 2021-08-17 ENCOUNTER — Ambulatory Visit (AMBULATORY_SURGERY_CENTER): Payer: Self-pay

## 2021-08-17 ENCOUNTER — Other Ambulatory Visit: Payer: Self-pay

## 2021-08-17 VITALS — Ht 71.0 in | Wt 210.0 lb

## 2021-08-17 DIAGNOSIS — Z86010 Personal history of colonic polyps: Secondary | ICD-10-CM

## 2021-08-17 MED ORDER — NA SULFATE-K SULFATE-MG SULF 17.5-3.13-1.6 GM/177ML PO SOLN: 1.0000 | Freq: Once | ORAL | 0 refills | Status: AC

## 2021-08-17 NOTE — Progress Notes (Signed)
Denies allergies to eggs or soy products. Denies complication of anesthesia or sedation. Denies use of weight loss medication. Denies use of O2.   Emmi instructions given for colonoscopy.  

## 2021-08-18 DIAGNOSIS — Z20822 Contact with and (suspected) exposure to covid-19: Secondary | ICD-10-CM | POA: Diagnosis not present

## 2021-08-31 ENCOUNTER — Telehealth: Payer: Self-pay

## 2021-08-31 ENCOUNTER — Other Ambulatory Visit: Payer: Self-pay

## 2021-08-31 DIAGNOSIS — Z86010 Personal history of colonic polyps: Secondary | ICD-10-CM

## 2021-08-31 NOTE — Telephone Encounter (Signed)
Per Dr. Tarri Glenn and Jenny Reichmann Nulty's request, 09/06/21 direct colon has been canceled d/t difficult airway. Direct colon has been rescheduled for first available hosp day at [facility] on 10/13/21 @ 1030am with Dr. Silverio Decamp, arrival time 9am. Dr. Silverio Decamp made aware of this change. Called pt to inform him about cancellation and rescheduled procedure date/time/location. Amb referral placed for auth purposes. LVM requesting returned call.  My Chart message sent to pt informing him about this change. Updated prep instructions sent via My Chart as well.

## 2021-08-31 NOTE — Telephone Encounter (Signed)
Pt returned call. Advised about the need to cancel [facility] direct colon, rationale and about newly scheduled date/time/location/provider for procedure. Advised all information has been sent via My Chart. Verbalized acceptance and understanding.

## 2021-08-31 NOTE — Telephone Encounter (Signed)
-----   Message from Thornton Park, MD sent at 08/31/2021  6:18 AM EST ----- Regarding: FW: [facility] pt Matthew Tucker, please see the note from [place]. I do not have an established relationship with this patient (i dont think!) so he could be scheduled with another doctor earlier.  Thanks  KLB ----- Message ----- From: Osvaldo Angst, CRNA Sent: 08/31/2021   6:15 AM EST To: Thornton Park, MD Subject: [facility] pt                                         Dr. Tarri Glenn,  This pt is scheduled with you on 12/27.  He is a difficult intubation and his procedure will need to be done at the hospital.  Thanks,  Osvaldo Angst

## 2021-09-06 ENCOUNTER — Encounter: Payer: Medicare Other | Admitting: Gastroenterology

## 2021-10-05 ENCOUNTER — Encounter (HOSPITAL_COMMUNITY): Payer: Self-pay | Admitting: Gastroenterology

## 2021-10-05 NOTE — Progress Notes (Signed)
Attempted to obtain medical history via telephone, unable to reach at this time. I left a voicemail to return pre surgical testing department's phone call.  

## 2021-10-13 ENCOUNTER — Ambulatory Visit (HOSPITAL_COMMUNITY): Payer: Medicare Other | Admitting: Anesthesiology

## 2021-10-13 ENCOUNTER — Encounter (HOSPITAL_COMMUNITY): Payer: Self-pay | Admitting: Gastroenterology

## 2021-10-13 ENCOUNTER — Ambulatory Visit (HOSPITAL_COMMUNITY)
Admission: RE | Admit: 2021-10-13 | Discharge: 2021-10-13 | Disposition: A | Discharge status: Home / Self Care | Payer: Medicare Other | Source: Ambulatory Visit | Attending: Gastroenterology | Admitting: Gastroenterology

## 2021-10-13 ENCOUNTER — Other Ambulatory Visit: Payer: Self-pay

## 2021-10-13 ENCOUNTER — Encounter (HOSPITAL_COMMUNITY)
Admission: RE | Disposition: A | Discharge status: Home / Self Care | Payer: Self-pay | Source: Ambulatory Visit | Attending: Gastroenterology

## 2021-10-13 DIAGNOSIS — K635 Polyp of colon: Secondary | ICD-10-CM

## 2021-10-13 DIAGNOSIS — Z1211 Encounter for screening for malignant neoplasm of colon: Secondary | ICD-10-CM | POA: Insufficient documentation

## 2021-10-13 DIAGNOSIS — D126 Benign neoplasm of colon, unspecified: Secondary | ICD-10-CM | POA: Diagnosis not present

## 2021-10-13 DIAGNOSIS — F419 Anxiety disorder, unspecified: Secondary | ICD-10-CM | POA: Diagnosis not present

## 2021-10-13 DIAGNOSIS — N289 Disorder of kidney and ureter, unspecified: Secondary | ICD-10-CM | POA: Insufficient documentation

## 2021-10-13 DIAGNOSIS — M1990 Unspecified osteoarthritis, unspecified site: Secondary | ICD-10-CM | POA: Diagnosis not present

## 2021-10-13 DIAGNOSIS — K648 Other hemorrhoids: Secondary | ICD-10-CM | POA: Insufficient documentation

## 2021-10-13 DIAGNOSIS — D120 Benign neoplasm of cecum: Secondary | ICD-10-CM | POA: Insufficient documentation

## 2021-10-13 DIAGNOSIS — D122 Benign neoplasm of ascending colon: Secondary | ICD-10-CM | POA: Insufficient documentation

## 2021-10-13 DIAGNOSIS — K621 Rectal polyp: Secondary | ICD-10-CM | POA: Diagnosis not present

## 2021-10-13 DIAGNOSIS — Z86010 Personal history of colonic polyps: Secondary | ICD-10-CM

## 2021-10-13 HISTORY — PX: POLYPECTOMY: SHX5525

## 2021-10-13 HISTORY — PX: COLONOSCOPY WITH PROPOFOL: SHX5780

## 2021-10-13 HISTORY — PX: BIOPSY: SHX5522

## 2021-10-13 SURGERY — COLONOSCOPY WITH PROPOFOL
Anesthesia: Monitor Anesthesia Care

## 2021-10-13 MED ORDER — SODIUM CHLORIDE 0.9 % IV SOLN: INTRAVENOUS | Status: DC

## 2021-10-13 MED ORDER — PROPOFOL 500 MG/50ML IV EMUL
INTRAVENOUS | Status: DC | PRN
  Administered 2021-10-13: 150 ug/kg/min via INTRAVENOUS

## 2021-10-13 MED ORDER — PROPOFOL 10 MG/ML IV BOLUS
INTRAVENOUS | Status: DC | PRN
  Administered 2021-10-13: 50 mg via INTRAVENOUS

## 2021-10-13 MED ORDER — LACTATED RINGERS IV SOLN: INTRAVENOUS | Status: DC

## 2021-10-13 MED ORDER — PROPOFOL 10 MG/ML IV BOLUS
INTRAVENOUS | Status: AC
  Filled 2021-10-13: qty 20

## 2021-10-13 MED ORDER — PROPOFOL 500 MG/50ML IV EMUL
INTRAVENOUS | Status: AC
  Filled 2021-10-13: qty 50

## 2021-10-13 MED ORDER — EPHEDRINE SULFATE-NACL 50-0.9 MG/10ML-% IV SOSY
PREFILLED_SYRINGE | INTRAVENOUS | Status: DC | PRN
  Administered 2021-10-13: 10 mg via INTRAVENOUS

## 2021-10-13 SURGICAL SUPPLY — 22 items

## 2021-10-13 NOTE — Op Note (Signed)
[facility] Patient Name: Matthew Tucker Procedure Date: 10/13/2021 MRN: [id number] Attending MD: Mauri Pole , MD Date of Birth: 05-09-1951 CSN: [id number] Age: 71 Admit Type: Outpatient Procedure:                Colonoscopy Indications:              Screening for colorectal malignant neoplasm Providers:                Mauri Pole, MD, Princess Bruins, RN, Jeanella Cara, RN, Tyna Jaksch Technician Referring MD:              Medicines:                Monitored Anesthesia Care Complications:            No immediate complications. Estimated Blood Loss:     Estimated blood loss was minimal. Procedure:                Pre-Anesthesia Assessment:                           - Prior to the procedure, a History and Physical                            was performed, and patient medications and                            allergies were reviewed. The patient's tolerance of                            previous anesthesia was also reviewed. The risks                            and benefits of the procedure and the sedation                            options and risks were discussed with the patient.                            All questions were answered, and informed consent                            was obtained. Prior Anticoagulants: The patient has                            taken no previous anticoagulant or antiplatelet                            agents. ASA Grade Assessment: III - A patient with                            severe systemic disease. After reviewing the risks  and benefits, the patient was deemed in                            satisfactory condition to undergo the procedure.                           After obtaining informed consent, the colonoscope                            was passed under direct vision. Throughout the                            procedure, the patient's blood pressure, pulse,  and                            oxygen saturations were monitored continuously. The                            PCF-HQ190L ([id number]) Olympus colonoscope was                            introduced through the anus and advanced to the the                            cecum, identified by appendiceal orifice and                            ileocecal valve. The colonoscopy was performed                            without difficulty. The patient tolerated the                            procedure well. The quality of the bowel                            preparation was good. The terminal ileum, ileocecal                            valve, appendiceal orifice, and rectum were                            photographed. Scope In: 10:26:34 AM Scope Out: 10:46:10 AM Scope Withdrawal Time: 0 hours 14 minutes 58 seconds  Total Procedure Duration: 0 hours 19 minutes 36 seconds  Findings:      The perianal and digital rectal examinations were normal.      Two sessile polyps were found in the rectum and cecum. The polyps were 1       to 2 mm in size. These polyps were removed with a cold biopsy forceps.       Resection and retrieval were complete.      A 4 mm polyp was found in the ascending colon. The polyp was sessile.       The polyp was removed with a cold snare. Resection and retrieval were  complete.      Non-bleeding internal hemorrhoids were found during retroflexion. The       hemorrhoids were medium-sized. Impression:               - Two 1 to 2 mm polyps in the rectum and in the                            cecum, removed with a cold biopsy forceps. Resected                            and retrieved.                           - One 4 mm polyp in the ascending colon, removed                            with a cold snare. Resected and retrieved.                           - Non-bleeding internal hemorrhoids. Moderate Sedation:      Not Applicable - Patient had care per Anesthesia. Recommendation:            - Patient has a contact number available for                            emergencies. The signs and symptoms of potential                            delayed complications were discussed with the                            patient. Return to normal activities tomorrow.                            Written discharge instructions were provided to the                            patient.                           - Resume previous diet.                           - Continue present medications.                           - Await pathology results.                           - No repeat colonoscopy due to age.                           - Return to GI office PRN. Procedure Code(s):        --- Professional ---  45385, Colonoscopy, flexible; with removal of                            tumor(s), polyp(s), or other lesion(s) by snare                            technique                           45380, 59, Colonoscopy, flexible; with biopsy,                            single or multiple Diagnosis Code(s):        --- Professional ---                           Z12.11, Encounter for screening for malignant                            neoplasm of colon                           K62.1, Rectal polyp                           K63.5, Polyp of colon                           K64.8, Other hemorrhoids CPT copyright 2019 American Medical Association. All rights reserved. The codes documented in this report are preliminary and upon coder review may  be revised to meet current compliance requirements. Mauri Pole, MD 10/13/2021 10:52:05 AM This report has been signed electronically. Number of Addenda: 0

## 2021-10-13 NOTE — Discharge Instructions (Signed)
YOU HAD AN ENDOSCOPIC PROCEDURE TODAY: Refer to the procedure report and other information in the discharge instructions given to you for any specific questions about what was found during the examination. If this information does not answer your questions, please call [facility] office at [phone] to clarify.  ° °YOU SHOULD EXPECT: Some feelings of bloating in the abdomen. Passage of more gas than usual. Walking can help get rid of the air that was put into your GI tract during the procedure and reduce the bloating. If you had a lower endoscopy (such as a colonoscopy or flexible sigmoidoscopy) you may notice spotting of blood in your stool or on the toilet paper. Some abdominal soreness may be present for a day or two, also. ° °DIET: Your first meal following the procedure should be a light meal and then it is ok to progress to your normal diet. A half-sandwich or bowl of soup is an example of a good first meal. Heavy or fried foods are harder to digest and may make you feel nauseous or bloated. Drink plenty of fluids but you should avoid alcoholic beverages for 24 hours. If you had a esophageal dilation, please see attached instructions for diet.   ° °ACTIVITY: Your care partner should take you home directly after the procedure. You should plan to take it easy, moving slowly for the rest of the day. You can resume normal activity the day after the procedure however YOU SHOULD NOT DRIVE, use power tools, machinery or perform tasks that involve climbing or major physical exertion for 24 hours (because of the sedation medicines used during the test).  ° °SYMPTOMS TO REPORT IMMEDIATELY: °A gastroenterologist can be reached at any hour. Please call [phone]  for any of the following symptoms:  °Following lower endoscopy (colonoscopy, flexible sigmoidoscopy) °Excessive amounts of blood in the stool  °Significant tenderness, worsening of abdominal pains  °Swelling of the abdomen that is new, acute  °Fever of 100° or  higher  °Following upper endoscopy (EGD, EUS, ERCP, esophageal dilation) °Vomiting of blood or coffee ground material  °New, significant abdominal pain  °New, significant chest pain or pain under the shoulder blades  °Painful or persistently difficult swallowing  °New shortness of breath  °Black, tarry-looking or red, bloody stools ° °FOLLOW UP:  °If any biopsies were taken you will be contacted by phone or by letter within the next 1-3 weeks. Call [phone]  if you have not heard about the biopsies in 3 weeks.  °Please also call with any specific questions about appointments or follow up tests. ° °

## 2021-10-13 NOTE — Anesthesia Preprocedure Evaluation (Addendum)
Anesthesia Evaluation  Patient identified by MRN, date of birth, ID band Patient awake    Reviewed: Allergy & Precautions, NPO status , Patient's Chart, lab work & pertinent test results  History of Anesthesia Complications Negative for: history of anesthetic complications  Airway Mallampati: II  TM Distance: >3 FB Neck ROM: Full    Dental  (+) Missing,    Pulmonary neg pulmonary ROS,    Pulmonary exam normal        Cardiovascular negative cardio ROS Normal cardiovascular exam     Neuro/Psych Anxiety negative neurological ROS     GI/Hepatic negative GI ROS, Neg liver ROS,   Endo/Other  negative endocrine ROS  Renal/GU Renal InsufficiencyRenal disease  negative genitourinary   Musculoskeletal  (+) Arthritis ,   Abdominal   Peds  Hematology negative hematology ROS (+)   Anesthesia Other Findings Day of surgery medications reviewed with patient.  Reproductive/Obstetrics negative OB ROS                            Anesthesia Physical Anesthesia Plan  ASA: 2  Anesthesia Plan: [name]   Post-op Pain Management: Minimal or no pain anticipated   Induction:   PONV Risk Score and Plan: 1 and Treatment may vary due to age or medical condition and Propofol infusion  Airway Management Planned: Natural Airway and Simple Face Mask  Additional Equipment: None  Intra-op Plan:   Post-operative Plan:   Informed Consent: I have reviewed the patients History and Physical, chart, labs and discussed the procedure including the risks, benefits and alternatives for the proposed anesthesia with the patient or authorized representative who has indicated his/her understanding and acceptance.       Plan Discussed with: CRNA  Anesthesia Plan Comments:        Anesthesia Quick Evaluation

## 2021-10-13 NOTE — H&P (Signed)
[facility] History and Physical   Primary Care Physician:  Biagio Borg, MD   Reason for Procedure:   Colorectal cancer screening  Plan:    Colonoscopy with possible interventions     HPI: Matthew Tucker is a 71 y.o. male here for colonoscopy with possible interventions for colorectal cancer screening. Denies any nausea, vomiting, abdominal pain, melena or bright red blood per rectum The risks and benefits as well as alternatives of endoscopic procedure(s) have been discussed and reviewed. All questions answered. The patient agrees to proceed.    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) 10/31/2010    Past Surgical History:  Procedure Laterality Date   BUNIONECTOMY     COLONOSCOPY     FOOT SURGERY Right    LUMBAR LAMINECTOMY/DECOMPRESSION MICRODISCECTOMY N/A 06/20/2018   Procedure: Laminectomy and Foraminotomy - Lumbar one-Lumbar two - Lumbar two-Lumbar three - Lumbar three-Lumbar four - Lumbar four-Lumbar five;  Surgeon: Eustace Moore, MD;  Location: [place];  Service: Neurosurgery;  Laterality: N/A;   mass removal     back, forehead; benign (lipoma)    mass removal  2011   head; benign   POSTERIOR CERVICAL FUSION/FORAMINOTOMY N/A 01/03/2018   Procedure: Posterior Cervical Fusion with lateral mass fixation - Cervical three - Cervical seven, cervical laminectomy Cervical three-cervical seven;  Surgeon: Eustace Moore, MD;  Location: [place];  Service: Neurosurgery;  Laterality: N/A;   ROOT CANAL     s/p lipoma right scalp posteriorly  2011    Prior to Admission medications   Medication Sig Start  Date End Date Taking? Authorizing Provider  aspirin EC 81 MG tablet Take 81 mg by mouth daily.   Yes [provider]  cholecalciferol (VITAMIN D3) 25 MCG (1000 UNIT) tablet Take 1,000 Units by mouth daily.   Yes [provider]  furosemide (LASIX) 20 MG tablet TAKE 1 TABLET(20 MG) BY MOUTH DAILY AS NEEDED 06/07/20  Yes Biagio Borg, MD  ibuprofen (ADVIL) 600 MG tablet TAKE 1 TABLET(600 MG) BY MOUTH EVERY 8 HOURS AS NEEDED 02/15/21  Yes Biagio Borg, MD  potassium chloride (KLOR-CON) 10 MEQ tablet TAKE 2 TABLETS(20 MEQ) BY MOUTH DAILY 09/13/20  Yes Biagio Borg, MD  rosuvastatin (CRESTOR) 20 MG tablet TAKE 1 TABLET(20 MG) BY MOUTH DAILY 01/19/21  Yes Biagio Borg, MD  tiZANidine (ZANAFLEX) 2 MG tablet Take 1 tablet (2 mg total) by mouth every 8 (eight) hours as needed for muscle spasms. 03/16/20  Yes Biagio Borg, MD  sildenafil (VIAGRA) 100 MG tablet Take 0.5-1 tablets (50-100 mg total) by mouth daily as needed for erectile dysfunction. 08/22/17   Biagio Borg, MD    Current Facility-Administered Medications  Medication Dose Route Frequency Provider Last Rate Last Admin   0.9 %  sodium chloride infusion   Intravenous Continuous Sinda Leedom, Venia Minks, MD       lactated ringers infusion   Intravenous Continuous Mauri Pole, MD 10 mL/hr at 10/13/21 1015 New Bag at 10/13/21 1015    Allergies as of 08/31/2021 - Review Complete 08/17/2021  Allergen Reaction  Noted   Other Other (See Comments) 01/03/2018   Levofloxacin Other (See Comments) 09/02/2007    Family History  Problem Relation Age of Onset   Cancer Mother        colon cancer   Colon cancer Father 62   Stroke Brother 38   Esophageal cancer Neg Hx    Stomach cancer Neg Hx    Rectal cancer Neg Hx     Social History   Socioeconomic History   Marital status: Married    Spouse name: Not on file   Number of children: Not on file   Years of education: Not on file   Highest education level: Not on file   Occupational History   Occupation: Geophysical data processor    Employer: [employer]  Tobacco Use   Smoking status: Never   Smokeless tobacco: Never  Vaping Use   Vaping Use: Never used  Substance and Sexual Activity   Alcohol use: Yes    Alcohol/week: 0.0 standard drinks    Comment: occasional   Drug use: No   Sexual activity: Yes  Other Topics Concern   Not on file  Social History Narrative   Not on file   Social Determinants of Health   Financial Resource Strain: Low Risk    Difficulty of Paying Living Expenses: Not hard at all  Food Insecurity: No Food Insecurity   Worried About Charity fundraiser in the Last Year: Never true   [place] in the Last Year: Never true  Transportation Needs: No Transportation Needs   Lack of Transportation (Medical): No   Lack of Transportation (Non-Medical): No  Physical Activity: Inactive   Days of Exercise per Week: 0 days   Minutes of Exercise per Session: 0 min  Stress: No Stress Concern Present   Feeling of Stress : Not at all  Social Connections: Socially Integrated   Frequency of Communication with Friends and Family: More than three times a week   Frequency of Social Gatherings with Friends and Family: More than three times a week   Attends Religious Services: More than 4 times per year   Active Member of [employer] or Organizations: Yes   Attends Music therapist: More than 4 times per year   Marital Status: Married  Human resources officer Violence: Not At Risk   Fear of Current or Ex-Partner: No   Emotionally Abused: No   Physically Abused: No   Sexually Abused: No    Review of Systems:  All other review of systems negative except as mentioned in the HPI.  Physical Exam: Vital signs in last 24 hours: Temp:  [98.5 F (36.9 C)] 98.5 F (36.9 C) (02/02 1001) Pulse Rate:  [82] 82 (02/02 1001) BP: (132)/(74) 132/74 (02/02 1001) SpO2:  [100 %] 100 % (02/02 1001) Weight:  [90.3 kg] 90.3 kg (02/02 1001)   General:    Alert, NAD Lungs:  Clear .   Heart:  Regular rate and rhythm Abdomen:  Soft, nontender and nondistended. Neuro/Psych:  Alert and cooperative. Normal mood and affect. A and O x 3   K. Denzil Magnuson , MD [phone]

## 2021-10-13 NOTE — Anesthesia Postprocedure Evaluation (Signed)
Anesthesia Post Note  Patient: Matthew Tucker  Procedure(s) Performed: COLONOSCOPY WITH PROPOFOL POLYPECTOMY     Patient location during evaluation: PACU Anesthesia Type: [name] Level of consciousness: awake and alert Pain management: pain level controlled Vital Signs Assessment: post-procedure vital signs reviewed and stable Respiratory status: spontaneous breathing, nonlabored ventilation and respiratory function stable Cardiovascular status: blood pressure returned to baseline Postop Assessment: no apparent nausea or vomiting Anesthetic complications: no   No notable events documented.  Last Vitals:  Vitals:   10/13/21 1101 10/13/21 1111  BP: 119/68 (!) 153/81  Pulse: 79 74  Resp: 18 17  Temp:    SpO2: 96% 95%    Last Pain:  Vitals:   10/13/21 1111  TempSrc:   PainSc: 0-No pain                 Marthenia Rolling

## 2021-10-13 NOTE — Transfer of Care (Signed)
Immediate Anesthesia Transfer of Care Note  Patient: Matthew Tucker  Procedure(s) Performed: COLONOSCOPY WITH PROPOFOL POLYPECTOMY  Patient Location: PACU and Endoscopy Unit  Anesthesia Type:[name]  Level of Consciousness: drowsy  Airway & Oxygen Therapy: Patient Spontanous Breathing  Post-op Assessment: Report given to RN and Post -op Vital signs reviewed and stable  Post vital signs: Reviewed and stable  Last Vitals:  Vitals Value Taken Time  BP 100/60 10/13/21 1052  Temp    Pulse 78 10/13/21 1053  Resp 19 10/13/21 1053  SpO2 95 % 10/13/21 1053  Vitals shown include unvalidated device data.  Last Pain:  Vitals:   10/13/21 1051  TempSrc:   PainSc: Asleep         Complications: No notable events documented.

## 2021-10-14 ENCOUNTER — Encounter (HOSPITAL_COMMUNITY): Payer: Self-pay | Admitting: Gastroenterology

## 2021-10-14 LAB — SURGICAL PATHOLOGY

## 2021-10-25 ENCOUNTER — Encounter: Payer: Self-pay | Admitting: Gastroenterology

## 2021-11-02 DIAGNOSIS — Z20828 Contact with and (suspected) exposure to other viral communicable diseases: Secondary | ICD-10-CM | POA: Diagnosis not present

## 2021-11-07 DIAGNOSIS — Z20822 Contact with and (suspected) exposure to covid-19: Secondary | ICD-10-CM | POA: Diagnosis not present

## 2021-11-16 ENCOUNTER — Other Ambulatory Visit: Payer: Self-pay | Admitting: Internal Medicine

## 2021-11-16 DIAGNOSIS — Z20828 Contact with and (suspected) exposure to other viral communicable diseases: Secondary | ICD-10-CM | POA: Diagnosis not present

## 2021-11-16 NOTE — Telephone Encounter (Signed)
Please refill as per office routine med refill policy (all routine meds to be refilled for 3 mo or monthly (per pt preference) up to one year from last visit, then month to month grace period for 3 mo, then further med refills will have to be denied) ? ?

## 2021-12-09 DIAGNOSIS — Z20822 Contact with and (suspected) exposure to covid-19: Secondary | ICD-10-CM | POA: Diagnosis not present

## 2021-12-15 DIAGNOSIS — Z20822 Contact with and (suspected) exposure to covid-19: Secondary | ICD-10-CM | POA: Diagnosis not present

## 2021-12-20 ENCOUNTER — Other Ambulatory Visit: Payer: Self-pay | Admitting: Internal Medicine

## 2021-12-20 NOTE — Telephone Encounter (Signed)
Please refill as per office routine med refill policy (all routine meds to be refilled for 3 mo or monthly (per pt preference) up to one year from last visit, then month to month grace period for 3 mo, then further med refills will have to be denied) ? ?

## 2021-12-23 ENCOUNTER — Ambulatory Visit (INDEPENDENT_AMBULATORY_CARE_PROVIDER_SITE_OTHER): Payer: Medicare Other | Admitting: Internal Medicine

## 2021-12-23 ENCOUNTER — Encounter: Payer: Self-pay | Admitting: Internal Medicine

## 2021-12-23 VITALS — BP 118/60 | HR 62 | Temp 98.2°F | Ht 71.0 in | Wt 206.0 lb

## 2021-12-23 DIAGNOSIS — E538 Deficiency of other specified B group vitamins: Secondary | ICD-10-CM

## 2021-12-23 DIAGNOSIS — E559 Vitamin D deficiency, unspecified: Secondary | ICD-10-CM

## 2021-12-23 DIAGNOSIS — Q7649 Other congenital malformations of spine, not associated with scoliosis: Secondary | ICD-10-CM | POA: Diagnosis not present

## 2021-12-23 DIAGNOSIS — R7302 Impaired glucose tolerance (oral): Secondary | ICD-10-CM | POA: Diagnosis not present

## 2021-12-23 DIAGNOSIS — E7800 Pure hypercholesterolemia, unspecified: Secondary | ICD-10-CM | POA: Diagnosis not present

## 2021-12-23 DIAGNOSIS — N1831 Chronic kidney disease, stage 3a: Secondary | ICD-10-CM

## 2021-12-23 DIAGNOSIS — R972 Elevated prostate specific antigen [PSA]: Secondary | ICD-10-CM

## 2021-12-23 DIAGNOSIS — N320 Bladder-neck obstruction: Secondary | ICD-10-CM | POA: Diagnosis not present

## 2021-12-23 LAB — URINALYSIS, ROUTINE W REFLEX MICROSCOPIC
Bilirubin Urine: NEGATIVE
Ketones, ur: NEGATIVE
Leukocytes,Ua: NEGATIVE
Nitrite: NEGATIVE
Specific Gravity, Urine: 1.02 (ref 1.000–1.030)
Urine Glucose: NEGATIVE
Urobilinogen, UA: 1 (ref 0.0–1.0)
pH: 6 (ref 5.0–8.0)

## 2021-12-23 LAB — HEPATIC FUNCTION PANEL
ALT: 12 U/L (ref 0–53)
AST: 20 U/L (ref 0–37)
Albumin: 4.1 g/dL (ref 3.5–5.2)
Alkaline Phosphatase: 83 U/L (ref 39–117)
Bilirubin, Direct: 0.2 mg/dL (ref 0.0–0.3)
Total Bilirubin: 1.2 mg/dL (ref 0.2–1.2)
Total Protein: 6.6 g/dL (ref 6.0–8.3)

## 2021-12-23 LAB — CBC WITH DIFFERENTIAL/PLATELET
Basophils Absolute: 0.1 K/uL (ref 0.0–0.1)
Basophils Relative: 1.2 % (ref 0.0–3.0)
Eosinophils Absolute: 0.3 K/uL (ref 0.0–0.7)
Eosinophils Relative: 4 % (ref 0.0–5.0)
HCT: 40.8 % (ref 39.0–52.0)
Hemoglobin: 13.6 g/dL (ref 13.0–17.0)
Lymphocytes Relative: 44.9 % (ref 12.0–46.0)
Lymphs Abs: 3.2 K/uL (ref 0.7–4.0)
MCHC: 33.3 g/dL (ref 30.0–36.0)
MCV: 89.7 fl (ref 78.0–100.0)
Monocytes Absolute: 0.7 K/uL (ref 0.1–1.0)
Monocytes Relative: 10 % (ref 3.0–12.0)
Neutro Abs: 2.8 K/uL (ref 1.4–7.7)
Neutrophils Relative %: 39.9 % — ABNORMAL LOW (ref 43.0–77.0)
Platelets: 159 K/uL (ref 150.0–400.0)
RBC: 4.54 Mil/uL (ref 4.22–5.81)
RDW: 13 % (ref 11.5–15.5)
WBC: 7 K/uL (ref 4.0–10.5)

## 2021-12-23 LAB — VITAMIN B12: Vitamin B-12: 460 pg/mL (ref 211–911)

## 2021-12-23 LAB — LIPID PANEL
Cholesterol: 143 mg/dL (ref 0–200)
HDL: 57.2 mg/dL (ref >39.00)
LDL Cholesterol: 70 mg/dL (ref 0–99)
NonHDL: 85.98
Total CHOL/HDL Ratio: 3
Triglycerides: 80 mg/dL (ref 0.0–149.0)
VLDL: 16 mg/dL (ref 0.0–40.0)

## 2021-12-23 LAB — HEMOGLOBIN A1C: Hgb A1c MFr Bld: 5.5 % (ref 4.6–6.5)

## 2021-12-23 LAB — TSH: TSH: 3.88 u[IU]/mL (ref 0.35–5.50)

## 2021-12-23 LAB — VITAMIN D 25 HYDROXY (VIT D DEFICIENCY, FRACTURES): VITD: 29 ng/mL — ABNORMAL LOW (ref 30.00–100.00)

## 2021-12-23 LAB — PSA: PSA: 1.74 ng/mL (ref 0.10–4.00)

## 2021-12-23 MED ORDER — CYCLOBENZAPRINE HCL 5 MG PO TABS: 5.0000 mg | ORAL_TABLET | Freq: Three times a day (TID) | ORAL | 2 refills | Status: DC | PRN

## 2021-12-23 MED ORDER — POTASSIUM CHLORIDE ER 10 MEQ PO TBCR: EXTENDED_RELEASE_TABLET | ORAL | 1 refills | Status: DC

## 2021-12-23 MED ORDER — ROSUVASTATIN CALCIUM 20 MG PO TABS: ORAL_TABLET | ORAL | 3 refills | Status: DC

## 2021-12-23 MED ORDER — FUROSEMIDE 20 MG PO TABS: ORAL_TABLET | ORAL | 3 refills | Status: DC

## 2021-12-23 NOTE — Patient Instructions (Signed)
Please take all new medication as prescribed - the muscle relaxer for muscle pain ? ?Please continue all other medications as before, and refills have been done if requested. ? ?Please have the pharmacy call with any other refills you may need. ? ?Please continue your efforts at being more active, low cholesterol diet, and weight control. ? ?You are otherwise up to date with prevention measures today. ? ?Please keep your appointments with your specialists as you may have planned ? ?Please go to the LAB at the blood drawing area for the tests to be done ? ?You will be contacted by phone if any changes need to be made immediately.  Otherwise, you will receive a letter about your results with an explanation, but please check with MyChart first. ? ?Please remember to sign up for MyChart if you have not done so, as this will be important to you in the future with finding out test results, communicating by private email, and scheduling acute appointments online when needed. ? ?Please make an Appointment to return for your 1 year visit, or sooner if needed ?

## 2021-12-23 NOTE — Progress Notes (Signed)
Patient ID: Matthew Tucker, male   DOB: 02/14/1951, 71 y.o.   MRN: [id number] ? ? ? ?     Chief Complaint:: yearly exam ? ?     HPI:  Matthew Tucker is a 71 y.o. male here overall doing well, but Pt continues to have recurring LBP without change in severity, bowel or bladder change, fever, wt loss,  worsening LE pain/numbness/weakness, gait change or falls.  Pt denies chest pain, increased sob or doe, wheezing, orthopnea, PND, increased LE swelling, palpitations, dizziness or syncope.   Pt denies polydipsia, polyuria, or new focal neuro s/s.    Pt denies fever, wt loss, night sweats, loss of appetite, or other constitutional symptoms   ?  ?Wt Readings from Last 3 Encounters:  ?12/23/21 206 lb (93.4 kg)  ?10/13/21 199 lb (90.3 kg)  ?08/17/21 210 lb (95.3 kg)  ? ?BP Readings from Last 3 Encounters:  ?12/23/21 118/60  ?10/13/21 (!) 153/81  ?06/07/21 122/80  ? ?Immunization History  ?Administered Date(s) Administered  ? Fluad Quad(high Dose 65+) 06/07/2021  ? Influenza Split 09/08/2011  ? Influenza Whole 06/12/2007  ? Influenza, High Dose Seasonal PF 05/20/2015, 08/22/2017, 05/17/2018  ? Influenza-Unspecified 06/10/2019  ? PFIZER(Purple Top)SARS-COV-2 Vaccination 11/10/2019, 12/11/2019, 06/21/2020, 03/08/2021  ? Pneumococcal Conjugate-13 01/29/2015  ? Pneumococcal Polysaccharide-23 08/22/2017  ? Td 01/04/2009  ? Tdap 02/20/2019  ? Zoster, Live 02/05/2014  ? ?There are no preventive care reminders to display for this patient. ? ?  ? ?Past Medical History:  ?Diagnosis Date  ? Arthritis   ? Neck, bilateral hands  ? BACK PAIN, CHRONIC 10/31/2010  ? Cervical stenosis of spine   ? Chronic lumbar radiculopathy 05/20/2015  ? Diastolic dysfunction 5/46/2703  ? Family history of colon cancer 01-27-2013  ? Father died at 23yo  ? HYPERLIPIDEMIA 09/02/2007  ? Impaired glucose tolerance 08/29/2011  ? PARESTHESIA 09/02/2007  ? Toxic effect of chlorine gas(987.6) 10/31/2010  ? ?Past Surgical History:  ?Procedure Laterality Date  ? BIOPSY   10/13/2021  ? Procedure: BIOPSY;  Surgeon: Mauri Pole, MD;  Location: [facility];  Service: Endoscopy;;  ? BUNIONECTOMY    ? COLONOSCOPY    ? COLONOSCOPY WITH PROPOFOL N/A 10/13/2021  ? Procedure: COLONOSCOPY WITH PROPOFOL;  Surgeon: Mauri Pole, MD;  Location: [facility];  Service: Endoscopy;  Laterality: N/A;  ? FOOT SURGERY Right   ? LUMBAR LAMINECTOMY/DECOMPRESSION MICRODISCECTOMY N/A 06/20/2018  ? Procedure: Laminectomy and Foraminotomy - Lumbar one-Lumbar two - Lumbar two-Lumbar three - Lumbar three-Lumbar four - Lumbar four-Lumbar five;  Surgeon: Eustace Moore, MD;  Location: [place];  Service: Neurosurgery;  Laterality: N/A;  ? mass removal    ? back, forehead; benign (lipoma)   ? mass removal  2011  ? head; benign  ? POLYPECTOMY  10/13/2021  ? Procedure: POLYPECTOMY;  Surgeon: Mauri Pole, MD;  Location: [facility];  Service: Endoscopy;;  ? POSTERIOR CERVICAL FUSION/FORAMINOTOMY N/A 01/03/2018  ? Procedure: Posterior Cervical Fusion with lateral mass fixation - Cervical three - Cervical seven, cervical laminectomy Cervical three-cervical seven;  Surgeon: Eustace Moore, MD;  Location: [place];  Service: Neurosurgery;  Laterality: N/A;  ? ROOT CANAL    ? s/p lipoma right scalp posteriorly  2011  ? ? reports that he has never smoked. He has never used smokeless tobacco. He reports current alcohol use. He reports that he does not use drugs. ?family history includes Cancer in his mother; Colon cancer (age of onset: 65) in his father;  Stroke (age of onset: 54) in his brother. ?Allergies  ?Allergen Reactions  ? Other Other (See Comments)  ?  Nivaquine. Found in [place] and [place] for Malaria treatment ? ?UNSPECIFIED REACTION   ? Levofloxacin Other (See Comments)  ?  Irritates gums  ? ?Current Outpatient Medications on File Prior to Visit  ?Medication Sig Dispense Refill  ? aspirin EC 81 MG tablet Take 81 mg by mouth daily.    ? cholecalciferol (VITAMIN D3) 25 MCG (1000 UNIT) tablet Take 1,000  Units by mouth daily.    ? ibuprofen (ADVIL) 600 MG tablet TAKE 1 TABLET(600 MG) BY MOUTH EVERY 8 HOURS AS NEEDED 60 tablet 2  ? sildenafil (VIAGRA) 100 MG tablet Take 0.5-1 tablets (50-100 mg total) by mouth daily as needed for erectile dysfunction. 10 tablet 11  ? ?No current facility-administered medications on file prior to visit.  ? ?     ROS:  All others reviewed and negative. ? ?Objective  ? ?     PE:  BP 118/60 (BP Location: Left Arm, Patient Position: Sitting, Cuff Size: Normal)   Pulse 62   Temp 98.2 ?F (36.8 ?C) (Oral)   Ht '5\' 11"'$  (1.803 m)   Wt 206 lb (93.4 kg)   SpO2 98%   BMI 28.73 kg/m?  ? ?              Constitutional: Pt appears in NAD ?              HENT: Head: NCAT.  ?              Right Ear: External ear normal.   ?              Left Ear: External ear normal.  ?              Eyes: . Pupils are equal, round, and reactive to light. Conjunctivae and EOM are normal ?              Nose: without d/c or deformity ?              Neck: Neck supple. Gross normal ROM ?              Cardiovascular: Normal rate and regular rhythm.   ?              Pulmonary/Chest: Effort normal and breath sounds without rales or wheezing.  ?              Abd:  Soft, NT, ND, + BS, no organomegaly ?              Neurological: Pt is alert. At baseline orientation, motor grossly intact ?              Skin: Skin is warm. No rashes, no other new lesions, LE edema - none ?              Psychiatric: Pt behavior is normal without agitation  ? ?Micro: none ? ?Cardiac tracings I have personally interpreted today:  none ? ?Pertinent Radiological findings (summarize): none  ? ?Lab Results  ?Component Value Date  ? WBC 7.0 12/23/2021  ? HGB 13.6 12/23/2021  ? HCT 40.8 12/23/2021  ? PLT 159.0 12/23/2021  ? GLUCOSE 84 12/21/2020  ? CHOL 143 12/23/2021  ? TRIG 80.0 12/23/2021  ? HDL 57.20 12/23/2021  ? LDLDIRECT 147.4 01/10/2013  ? [place] 70 12/23/2021  ? ALT 12 12/23/2021  ? AST 20  12/23/2021  ? NA 141 12/21/2020  ? K 3.9 12/21/2020   ? CL 105 12/21/2020  ? CREATININE 1.34 12/21/2020  ? BUN 15 12/21/2020  ? CO2 26 12/21/2020  ? TSH 3.88 12/23/2021  ? PSA 1.74 12/23/2021  ? INR 1.10 06/10/2018  ? HGBA1C 5.5 12/23/2021  ? ?Assessment/Plan:  ?Matthew Tucker is a 71 y.o. Black or African American [2] male with  has a past medical history of Arthritis, BACK PAIN, CHRONIC (10/31/2010), Cervical stenosis of spine, Chronic lumbar radiculopathy (03/11/2457), Diastolic dysfunction (0/99/8338), Family history of colon cancer (01/17/2013), HYPERLIPIDEMIA (09/02/2007), Impaired glucose tolerance (08/29/2011), PARESTHESIA (09/02/2007), and Toxic effect of chlorine gas(987.6) (10/31/2010). ? ?Congenital spinal stenosis of lumbar region ?With chronic recurrent pain mild worsening recent, for flexeril prn,  to f/u any worsening symptoms or concerns   ? ?CKD (chronic kidney disease) stage 3, GFR 30-59 ml/min (HCC) ?Lab Results  ?Component Value Date  ? CREATININE 1.34 12/21/2020  ? ?Stable overall, cont to avoid nephrotoxins ? ? ?Hyperlipidemia ?Lab Results  ?Component Value Date  ? [place] 70 12/23/2021  ? ?Mild uncontrolled, goal ldl < 70,, pt to continue current statin crester 20 as declines change ? ? ?Impaired glucose tolerance ?Lab Results  ?Component Value Date  ? HGBA1C 5.5 12/23/2021  ? ?Stable, pt to continue current medical treatment  - diet ? ? ?Increased prostate specific antigen (PSA) velocity ?Lab Results  ?Component Value Date  ? PSA 1.74 12/23/2021  ? PSA 2.01 12/21/2020  ? PSA 1.32 02/20/2020  ? ?Stable, asympt, cont to follow,  ? ?Vitamin D deficiency ?Last vitamin D ?Lab Results  ?Component Value Date  ? VD25OH 29.00 (L) 12/23/2021  ? ?Low, to start oral replacement ? ?Followup: No follow-ups on file. ? ?Cathlean Cower, MD 12/24/2021 7:39 PM ?[place] ?[place] ?Internal Medicine ?

## 2021-12-24 ENCOUNTER — Encounter: Payer: Self-pay | Admitting: Internal Medicine

## 2021-12-24 NOTE — Assessment & Plan Note (Signed)
Lab Results  ?Component Value Date  ? CREATININE 1.34 12/21/2020  ? ?Stable overall, cont to avoid nephrotoxins ? ?

## 2021-12-24 NOTE — Assessment & Plan Note (Signed)
Last vitamin D Lab Results  Component Value Date   VD25OH 29.00 (L) 12/23/2021   Low, to start oral replacement  

## 2021-12-24 NOTE — Assessment & Plan Note (Signed)
Lab Results  ?Component Value Date  ? PSA 1.74 12/23/2021  ? PSA 2.01 12/21/2020  ? PSA 1.32 02/20/2020  ? ?Stable, asympt, cont to follow,  ?

## 2021-12-24 NOTE — Assessment & Plan Note (Signed)
Lab Results  ?Component Value Date  ? [place] 70 12/23/2021  ? ?Mild uncontrolled, goal ldl < 70,, pt to continue current statin crester 20 as declines change ? ?

## 2021-12-24 NOTE — Assessment & Plan Note (Signed)
Lab Results  ?Component Value Date  ? HGBA1C 5.5 12/23/2021  ? ?Stable, pt to continue current medical treatment  - diet ? ?

## 2021-12-24 NOTE — Assessment & Plan Note (Signed)
With chronic recurrent pain mild worsening recent, for flexeril prn,  to f/u any worsening symptoms or concerns   ?

## 2021-12-26 DIAGNOSIS — Z20822 Contact with and (suspected) exposure to covid-19: Secondary | ICD-10-CM | POA: Diagnosis not present

## 2022-01-03 DIAGNOSIS — Z20822 Contact with and (suspected) exposure to covid-19: Secondary | ICD-10-CM | POA: Diagnosis not present

## 2022-01-16 DIAGNOSIS — Z20822 Contact with and (suspected) exposure to covid-19: Secondary | ICD-10-CM | POA: Diagnosis not present

## 2022-02-27 ENCOUNTER — Telehealth: Payer: Self-pay | Admitting: Internal Medicine

## 2022-02-27 MED ORDER — IBUPROFEN 600 MG PO TABS: ORAL_TABLET | ORAL | 2 refills | Status: DC

## 2022-02-27 NOTE — Telephone Encounter (Signed)
Caller & Relationship to patient: Hudsen Fei  Call back number: [phone]  Date of last office visit: 12/23/21  Date of next office visit: 12/26/22  Medication(s) to be refilled: ibuprofen (ADVIL) 600 MG tablet Also requesting a muscle relaxer       Preferred Pharmacy:  [facility], [place] AT [facility] Phone:  [phone]  Fax:  [phone]

## 2022-04-03 ENCOUNTER — Telehealth: Payer: Self-pay | Admitting: Internal Medicine

## 2022-04-03 NOTE — Telephone Encounter (Signed)
Pt tested positive for covid today. Has had cough, congestion, headache, sore throat but no fever for the past 2 days. Pt would like an RX called into [facility] AT [facility]  Phone:  [phone]  Fax:  [phone]

## 2022-04-04 ENCOUNTER — Telehealth: Payer: Self-pay | Admitting: Internal Medicine

## 2022-04-04 ENCOUNTER — Telehealth (INDEPENDENT_AMBULATORY_CARE_PROVIDER_SITE_OTHER): Payer: Medicare Other | Admitting: Internal Medicine

## 2022-04-04 DIAGNOSIS — U071 COVID-19: Secondary | ICD-10-CM

## 2022-04-04 DIAGNOSIS — R7302 Impaired glucose tolerance (oral): Secondary | ICD-10-CM

## 2022-04-04 DIAGNOSIS — E559 Vitamin D deficiency, unspecified: Secondary | ICD-10-CM

## 2022-04-04 MED ORDER — NIRMATRELVIR/RITONAVIR (PAXLOVID) TABLET (RENAL DOSING): 2.0000 | ORAL_TABLET | Freq: Two times a day (BID) | ORAL | 0 refills | Status: AC

## 2022-04-04 MED ORDER — HYDROCODONE BIT-HOMATROP MBR 5-1.5 MG/5ML PO SOLN: 5.0000 mL | Freq: Four times a day (QID) | ORAL | 0 refills | Status: AC | PRN

## 2022-04-04 NOTE — Progress Notes (Unsigned)
Patient ID: Matthew Tucker, male   DOB: 02-Aug-1951, 71 y.o.   MRN: [id number]  Virtual Visit via Video Note  I connected with Matthew Tucker on April 04, 2022 at  2:20 PM EDT by a video enabled telemedicine application and verified that I am speaking with the correct person using two identifiers.  Location of all participants today Patient:  at home Provider: at office   I discussed the limitations of evaluation and management by telemedicine and the availability of in person appointments. The patient expressed understanding and agreed to proceed.  History of Present Illness: Here to f/u after testin + for covid at home today, after wife found + after symptoms starting mon after church exposure Sunday, and pt symptoms starting jluy 24.  Has intact smell and tast, but lower appetite and lost 5 lbs.  Has mild sob, scant prod cough, and Pt denies chest pain, increased sob or doe, wheezing, orthopnea, PND, increased LE swelling, palpitations, dizziness or syncope.  No n/v or diarrhea.  No prior hx of covid infection, has had 2 shots vax and 1 booster.   Past Medical History:  Diagnosis Date   Arthritis    Neck, bilateral hands   BACK PAIN, CHRONIC 10/31/2010   Cervical stenosis of spine    Chronic lumbar radiculopathy 0/11/4915   Diastolic dysfunction 05/26/568   Family history of colon cancer 01/20/2013   Father died at 54yo   HYPERLIPIDEMIA 09-05-2007   Impaired glucose tolerance 08/29/2011   PARESTHESIA September 05, 2007   Toxic effect of chlorine gas(987.6) 10/31/2010   Past Surgical History:  Procedure Laterality Date   BIOPSY  10/13/2021   Procedure: BIOPSY;  Surgeon: Mauri Pole, MD;  Location: [facility];  Service: Endoscopy;;   BUNIONECTOMY     COLONOSCOPY     COLONOSCOPY WITH PROPOFOL N/A 10/13/2021   Procedure: COLONOSCOPY WITH PROPOFOL;  Surgeon: Mauri Pole, MD;  Location: [facility];  Service: Endoscopy;  Laterality: N/A;   FOOT SURGERY Right    LUMBAR  LAMINECTOMY/DECOMPRESSION MICRODISCECTOMY N/A 06/20/2018   Procedure: Laminectomy and Foraminotomy - Lumbar one-Lumbar two - Lumbar two-Lumbar three - Lumbar three-Lumbar four - Lumbar four-Lumbar five;  Surgeon: Eustace Moore, MD;  Location: [place];  Service: Neurosurgery;  Laterality: N/A;   mass removal     back, forehead; benign (lipoma)    mass removal  2011   head; benign   POLYPECTOMY  10/13/2021   Procedure: POLYPECTOMY;  Surgeon: Mauri Pole, MD;  Location: [facility];  Service: Endoscopy;;   POSTERIOR CERVICAL FUSION/FORAMINOTOMY N/A 01/03/2018   Procedure: Posterior Cervical Fusion with lateral mass fixation - Cervical three - Cervical seven, cervical laminectomy Cervical three-cervical seven;  Surgeon: Eustace Moore, MD;  Location: [place];  Service: Neurosurgery;  Laterality: N/A;   ROOT CANAL     s/p lipoma right scalp posteriorly  2011    reports that he has never smoked. He has never used smokeless tobacco. He reports current alcohol use. He reports that he does not use drugs. family history includes Cancer in his mother; Colon cancer (age of onset: 15) in his father; Stroke (age of onset: 74) in his brother. Allergies  Allergen Reactions   Other Other (See Comments)    Nivaquine. Found in [place] and [place] for Malaria treatment  UNSPECIFIED REACTION    Levofloxacin Other (See Comments)    Irritates gums   Current Outpatient Medications on File Prior to Visit  Medication Sig Dispense Refill   aspirin  EC 81 MG tablet Take 81 mg by mouth daily.     cholecalciferol (VITAMIN D3) 25 MCG (1000 UNIT) tablet Take 1,000 Units by mouth daily.     cyclobenzaprine (FLEXERIL) 5 MG tablet Take 1 tablet (5 mg total) by mouth 3 (three) times daily as needed for muscle spasms. 60 tablet 2   furosemide (LASIX) 20 MG tablet TAKE 1 TABLET(20 MG) BY MOUTH DAILY AS NEEDED 90 tablet 3   ibuprofen (ADVIL) 600 MG tablet TAKE 1 TABLET(600 MG) BY MOUTH EVERY 8 HOURS AS NEEDED 60 tablet 2    potassium chloride (KLOR-CON) 10 MEQ tablet TAKE 2 TABLETS(20 MEQ) BY MOUTH DAILY as needed with fluid pill 60 tablet 1   rosuvastatin (CRESTOR) 20 MG tablet TAKE 1 TABLET(20 MG) BY MOUTH DAILY 90 tablet 3   sildenafil (VIAGRA) 100 MG tablet Take 0.5-1 tablets (50-100 mg total) by mouth daily as needed for erectile dysfunction. 10 tablet 11   No current facility-administered medications on file prior to visit.    Observations/Objective: Alert, NAD, appropriate mood and affect, resps normal, cn 2-12 intact, moves all 4s, no visible rash or swelling Lab Results  Component Value Date   WBC 7.0 12/23/2021   HGB 13.6 12/23/2021   HCT 40.8 12/23/2021   PLT 159.0 12/23/2021   GLUCOSE 84 12/21/2020   CHOL 143 12/23/2021   TRIG 80.0 12/23/2021   HDL 57.20 12/23/2021   LDLDIRECT 147.4 01/10/2013   LDLCALC 70 12/23/2021   ALT 12 12/23/2021   AST 20 12/23/2021   NA 141 12/21/2020   K 3.9 12/21/2020   CL 105 12/21/2020   CREATININE 1.34 12/21/2020   BUN 15 12/21/2020   CO2 26 12/21/2020   TSH 3.88 12/23/2021   PSA 1.74 12/23/2021   INR 1.10 06/10/2018   HGBA1C 5.5 12/23/2021   Assessment and Plan: See notes  Follow Up Instructions: See notes   I discussed the assessment and treatment plan with the patient. The patient was provided an opportunity to ask questions and all were answered. The patient agreed with the plan and demonstrated an understanding of the instructions.   The patient was advised to call back or seek an in-person evaluation if the symptoms worsen or if the condition fails to improve as anticipated.   Cathlean Cower, MD

## 2022-04-04 NOTE — Telephone Encounter (Signed)
Patient scheduled for VV today at 2:20

## 2022-04-04 NOTE — Telephone Encounter (Signed)
Sorry, there are no other strengths, so that is confusing about why they might have told him that  I suspect they mean they dont have these in stock  I can send to different pharmacy - just let me know

## 2022-04-04 NOTE — Telephone Encounter (Signed)
Patient states that a different strength will have to be called in to the pharmacy on the paxlovid and the hydrocodone

## 2022-04-05 NOTE — Telephone Encounter (Signed)
Called pt to clarify msg.. Pt states first they were out of stock, but they call last night he was able to get both meds filled and he is currently taking with no problem.Marland KitchenJohny Tucker

## 2022-04-06 ENCOUNTER — Encounter: Payer: Self-pay | Admitting: Internal Medicine

## 2022-04-06 DIAGNOSIS — U071 COVID-19: Secondary | ICD-10-CM | POA: Insufficient documentation

## 2022-04-06 NOTE — Assessment & Plan Note (Signed)
Lab Results  Component Value Date   HGBA1C 5.5 12/23/2021   Stable, pt to continue current medical treatment  - diet, wt control, excercise

## 2022-04-06 NOTE — Assessment & Plan Note (Signed)
Acute onset, for paxlovid course renal dosing, cough med prn, and advised vit d and c and zinc start. to f/u any worsening symptoms or concerns

## 2022-04-06 NOTE — Patient Instructions (Signed)
Please take all new medication as prescribed 

## 2022-04-06 NOTE — Assessment & Plan Note (Signed)
Last vitamin D Lab Results  Component Value Date   VD25OH 29.00 (L) 12/23/2021   Low, to start oral replacement

## 2022-04-25 DIAGNOSIS — M5136 Other intervertebral disc degeneration, lumbar region: Secondary | ICD-10-CM | POA: Diagnosis not present

## 2022-04-25 DIAGNOSIS — M549 Dorsalgia, unspecified: Secondary | ICD-10-CM | POA: Diagnosis not present

## 2022-04-25 DIAGNOSIS — M069 Rheumatoid arthritis, unspecified: Secondary | ICD-10-CM | POA: Diagnosis not present

## 2022-04-25 DIAGNOSIS — M79642 Pain in left hand: Secondary | ICD-10-CM | POA: Diagnosis not present

## 2022-04-25 DIAGNOSIS — M1990 Unspecified osteoarthritis, unspecified site: Secondary | ICD-10-CM | POA: Diagnosis not present

## 2022-04-25 DIAGNOSIS — E785 Hyperlipidemia, unspecified: Secondary | ICD-10-CM | POA: Diagnosis not present

## 2022-04-25 DIAGNOSIS — M79641 Pain in right hand: Secondary | ICD-10-CM | POA: Diagnosis not present

## 2022-04-25 DIAGNOSIS — R789 Finding of unspecified substance, not normally found in blood: Secondary | ICD-10-CM | POA: Diagnosis not present

## 2022-04-25 DIAGNOSIS — M542 Cervicalgia: Secondary | ICD-10-CM | POA: Diagnosis not present

## 2022-04-25 DIAGNOSIS — M5030 Other cervical disc degeneration, unspecified cervical region: Secondary | ICD-10-CM | POA: Diagnosis not present

## 2022-05-02 DIAGNOSIS — R269 Unspecified abnormalities of gait and mobility: Secondary | ICD-10-CM | POA: Diagnosis not present

## 2022-05-02 DIAGNOSIS — M62549 Muscle wasting and atrophy, not elsewhere classified, unspecified hand: Secondary | ICD-10-CM | POA: Diagnosis not present

## 2022-05-05 DIAGNOSIS — M6250 Muscle wasting and atrophy, not elsewhere classified, unspecified site: Secondary | ICD-10-CM | POA: Insufficient documentation

## 2022-05-31 DIAGNOSIS — G5622 Lesion of ulnar nerve, left upper limb: Secondary | ICD-10-CM | POA: Diagnosis not present

## 2022-05-31 DIAGNOSIS — G5601 Carpal tunnel syndrome, right upper limb: Secondary | ICD-10-CM | POA: Diagnosis not present

## 2022-05-31 DIAGNOSIS — G5621 Lesion of ulnar nerve, right upper limb: Secondary | ICD-10-CM | POA: Diagnosis not present

## 2022-06-08 ENCOUNTER — Ambulatory Visit (INDEPENDENT_AMBULATORY_CARE_PROVIDER_SITE_OTHER): Payer: Medicare Other

## 2022-06-08 VITALS — Ht 71.0 in | Wt 197.0 lb

## 2022-06-08 DIAGNOSIS — Z0000 Encounter for general adult medical examination without abnormal findings: Secondary | ICD-10-CM | POA: Diagnosis not present

## 2022-06-08 NOTE — Patient Instructions (Signed)
Matthew Tucker , Thank you for taking time to come for your Medicare Wellness Visit. I appreciate your ongoing commitment to your health goals. Please review the following plan we discussed and let me know if I can assist you in the future.   Screening recommendations/referrals: Colonoscopy: 10/13/2021; according to report no longer recommended Recommended yearly ophthalmology/optometry visit for glaucoma screening and checkup Recommended yearly dental visit for hygiene and checkup  Vaccinations: Influenza vaccine: due Pneumococcal vaccine: completed Tdap vaccine: completed Shingles vaccine: due   Covid-19: completed  Advanced directives: No  Conditions/risks identified: Yes  Next appointment: Please schedule your next Medicare Wellness Visit with your Nurse Health Advisor in 1 year by calling [phone].  Preventive Care 73 Years and Older, Male Preventive care refers to lifestyle choices and visits with your health care provider that can promote health and wellness. What does preventive care include? A yearly physical exam. This is also called an annual well check. Dental exams once or twice a year. Routine eye exams. Ask your health care provider how often you should have your eyes checked. Personal lifestyle choices, including: Daily care of your teeth and gums. Regular physical activity. Eating a healthy diet. Avoiding tobacco and drug use. Limiting alcohol use. Practicing safe sex. Taking low doses of aspirin every day. Taking vitamin and mineral supplements as recommended by your health care provider. What happens during an annual well check? The services and screenings done by your health care provider during your annual well check will depend on your age, overall health, lifestyle risk factors, and family history of disease. Counseling  Your health care provider may ask you questions about your: Alcohol use. Tobacco use. Drug use. Emotional well-being. Home and  relationship well-being. Sexual activity. Eating habits. History of falls. Memory and ability to understand (cognition). Work and work Statistician. Screening  You may have the following tests or measurements: Height, weight, and BMI. Blood pressure. Lipid and cholesterol levels. These may be checked every 5 years, or more frequently if you are over 73 years old. Skin check. Lung cancer screening. You may have this screening every year starting at age 73 if you have a 30-pack-year history of smoking and currently smoke or have quit within the past 15 years. Fecal occult blood test (FOBT) of the stool. You may have this test every year starting at age 73. Flexible sigmoidoscopy or colonoscopy. You may have a sigmoidoscopy every 5 years or a colonoscopy every 10 years starting at age 73. Prostate cancer screening. Recommendations will vary depending on your family history and other risks. Hepatitis C blood test. Hepatitis B blood test. Sexually transmitted disease (STD) testing. Diabetes screening. This is done by checking your blood sugar (glucose) after you have not eaten for a while (fasting). You may have this done every 1-3 years. Abdominal aortic aneurysm (AAA) screening. You may need this if you are a current or former smoker. Osteoporosis. You may be screened starting at age 73 if you are at high risk. Talk with your health care provider about your test results, treatment options, and if necessary, the need for more tests. Vaccines  Your health care provider may recommend certain vaccines, such as: Influenza vaccine. This is recommended every year. Tetanus, diphtheria, and acellular pertussis (Tdap, Td) vaccine. You may need a Td booster every 10 years. Zoster vaccine. You may need this after age 5. Pneumococcal 13-valent conjugate (PCV13) vaccine. One dose is recommended after age 73. Pneumococcal polysaccharide (PPSV23) vaccine. One dose is recommended after age  73. Talk to your  health care provider about which screenings and vaccines you need and how often you need them. This information is not intended to replace advice given to you by your health care provider. Make sure you discuss any questions you have with your health care provider. Document Released: 09/24/2015 Document Revised: 05/17/2016 Document Reviewed: 06/29/2015 Elsevier Interactive Patient Education  2017 [place] Prevention in the Home Falls can cause injuries. They can happen to people of all ages. There are many things you can do to make your home safe and to help prevent falls. What can I do on the outside of my home? Regularly fix the edges of walkways and driveways and fix any cracks. Remove anything that might make you trip as you walk through a door, such as a raised step or threshold. Trim any bushes or trees on the path to your home. Use bright outdoor lighting. Clear any walking paths of anything that might make someone trip, such as rocks or tools. Regularly check to see if handrails are loose or broken. Make sure that both sides of any steps have handrails. Any raised decks and porches should have guardrails on the edges. Have any leaves, snow, or ice cleared regularly. Use sand or salt on walking paths during winter. Clean up any spills in your garage right away. This includes oil or grease spills. What can I do in the bathroom? Use night lights. Install grab bars by the toilet and in the tub and shower. Do not use towel bars as grab bars. Use non-skid mats or decals in the tub or shower. If you need to sit down in the shower, use a plastic, non-slip stool. Keep the floor dry. Clean up any water that spills on the floor as soon as it happens. Remove soap buildup in the tub or shower regularly. Attach bath mats securely with double-sided non-slip rug tape. Do not have throw rugs and other things on the floor that can make you trip. What can I do in the bedroom? Use night  lights. Make sure that you have a light by your bed that is easy to reach. Do not use any sheets or blankets that are too big for your bed. They should not hang down onto the floor. Have a firm chair that has side arms. You can use this for support while you get dressed. Do not have throw rugs and other things on the floor that can make you trip. What can I do in the kitchen? Clean up any spills right away. Avoid walking on wet floors. Keep items that you use a lot in easy-to-reach places. If you need to reach something above you, use a strong step stool that has a grab bar. Keep electrical cords out of the way. Do not use floor polish or wax that makes floors slippery. If you must use wax, use non-skid floor wax. Do not have throw rugs and other things on the floor that can make you trip. What can I do with my stairs? Do not leave any items on the stairs. Make sure that there are handrails on both sides of the stairs and use them. Fix handrails that are broken or loose. Make sure that handrails are as long as the stairways. Check any carpeting to make sure that it is firmly attached to the stairs. Fix any carpet that is loose or worn. Avoid having throw rugs at the top or bottom of the stairs. If you do have  throw rugs, attach them to the floor with carpet tape. Make sure that you have a light switch at the top of the stairs and the bottom of the stairs. If you do not have them, ask someone to add them for you. What else can I do to help prevent falls? Wear shoes that: Do not have high heels. Have rubber bottoms. Are comfortable and fit you well. Are closed at the toe. Do not wear sandals. If you use a stepladder: Make sure that it is fully opened. Do not climb a closed stepladder. Make sure that both sides of the stepladder are locked into place. Ask someone to hold it for you, if possible. Clearly mark and make sure that you can see: Any grab bars or handrails. First and last  steps. Where the edge of each step is. Use tools that help you move around (mobility aids) if they are needed. These include: Canes. Walkers. Scooters. Crutches. Turn on the lights when you go into a dark area. Replace any light bulbs as soon as they burn out. Set up your furniture so you have a clear path. Avoid moving your furniture around. If any of your floors are uneven, fix them. If there are any pets around you, be aware of where they are. Review your medicines with your doctor. Some medicines can make you feel dizzy. This can increase your chance of falling. Ask your doctor what other things that you can do to help prevent falls. This information is not intended to replace advice given to you by your health care provider. Make sure you discuss any questions you have with your health care provider. Document Released: 06/24/2009 Document Revised: 02/03/2016 Document Reviewed: 10/02/2014 Elsevier Interactive Patient Education  2017 [employer].

## 2022-06-08 NOTE — Progress Notes (Signed)
Virtual Visit via Telephone Note  I connected with  [place] on 06/08/22 at  8:45 AM EDT by telephone and verified that I am speaking with the correct person using two identifiers.  Location: Patient: Home Provider: [place] Persons participating in the virtual visit: [place]   I discussed the limitations, risks, security and privacy concerns of performing an evaluation and management service by telephone and the availability of in person appointments. The patient expressed understanding and agreed to proceed.  Interactive audio and video telecommunications were attempted between this nurse and patient, however failed, due to patient having technical difficulties OR patient did not have access to video capability.  We continued and completed visit with audio only.  Some vital signs may be absent or patient reported.   Sheral Flow, LPN  Subjective:   RAYDEL HOSICK is a 71 y.o. male who presents for Medicare Annual/Subsequent preventive examination.  Review of Systems     Cardiac Risk Factors include: advanced age (>68mn, >>52women);dyslipidemia;male gender;family history of premature cardiovascular disease     Objective:    Today's Vitals   06/08/22 0903  Weight: 197 lb (89.4 kg)  Height: '5\' 11"'$  (1.803 m)  PainSc: 0-No pain   Body mass index is 27.48 kg/m.     06/08/2022    8:50 AM 10/13/2021    9:56 AM 06/07/2021    9:26 AM 02/20/2020    8:41 AM 06/10/2018   11:30 AM 12/26/2017   11:08 AM  Advanced Directives  Does Patient Have a Medical Advance Directive? No No No No No No  Would patient like information on creating a medical advance directive? No - Patient declined  No - Patient declined No - Patient declined No - Patient declined No - Patient declined    Current Medications (verified) Outpatient Encounter Medications as of 06/08/2022  Medication Sig   aspirin EC 81 MG tablet Take 81 mg by mouth daily.   cholecalciferol  (VITAMIN D3) 25 MCG (1000 UNIT) tablet Take 1,000 Units by mouth daily.   cyclobenzaprine (FLEXERIL) 5 MG tablet Take 1 tablet (5 mg total) by mouth 3 (three) times daily as needed for muscle spasms.   furosemide (LASIX) 20 MG tablet TAKE 1 TABLET(20 MG) BY MOUTH DAILY AS NEEDED   ibuprofen (ADVIL) 600 MG tablet TAKE 1 TABLET(600 MG) BY MOUTH EVERY 8 HOURS AS NEEDED   potassium chloride (KLOR-CON) 10 MEQ tablet TAKE 2 TABLETS(20 MEQ) BY MOUTH DAILY as needed with fluid pill   rosuvastatin (CRESTOR) 20 MG tablet TAKE 1 TABLET(20 MG) BY MOUTH DAILY   sildenafil (VIAGRA) 100 MG tablet Take 0.5-1 tablets (50-100 mg total) by mouth daily as needed for erectile dysfunction.   No facility-administered encounter medications on file as of 06/08/2022.    Allergies (verified) Other and Levofloxacin   History: Past Medical History:  Diagnosis Date   Arthritis    Neck, bilateral hands   BACK PAIN, CHRONIC 10/31/2010   Cervical stenosis of spine    Chronic lumbar radiculopathy 96/03/3418  Diastolic dysfunction 93/79/0240  Family history of colon cancer 505/20/2014  Father died at 738yo  HYPERLIPIDEMIA 101-02-09  Impaired glucose tolerance 08/29/2011   PARESTHESIA 101-10-2007  Toxic effect of chlorine gas(987.6) 10/31/2010   Past Surgical History:  Procedure Laterality Date   BIOPSY  10/13/2021   Procedure: BIOPSY;  Surgeon: NMauri Pole MD;  Location: [facility];  Service: Endoscopy;;   BLillard Anes  COLONOSCOPY     COLONOSCOPY WITH PROPOFOL N/A 10/13/2021   Procedure: COLONOSCOPY WITH PROPOFOL;  Surgeon: Mauri Pole, MD;  Location: [facility];  Service: Endoscopy;  Laterality: N/A;   FOOT SURGERY Right    LUMBAR LAMINECTOMY/DECOMPRESSION MICRODISCECTOMY N/A 06/20/2018   Procedure: Laminectomy and Foraminotomy - Lumbar one-Lumbar two - Lumbar two-Lumbar three - Lumbar three-Lumbar four - Lumbar four-Lumbar five;  Surgeon: Eustace Moore, MD;  Location: [facility] [place];  Service:  Neurosurgery;  Laterality: N/A;   mass removal     back, forehead; benign (lipoma)    mass removal  2011   head; benign   POLYPECTOMY  10/13/2021   Procedure: POLYPECTOMY;  Surgeon: Mauri Pole, MD;  Location: [facility];  Service: Endoscopy;;   POSTERIOR CERVICAL FUSION/FORAMINOTOMY N/A 01/03/2018   Procedure: Posterior Cervical Fusion with lateral mass fixation - Cervical three - Cervical seven, cervical laminectomy Cervical three-cervical seven;  Surgeon: Eustace Moore, MD;  Location: [facility];  Service: Neurosurgery;  Laterality: N/A;   ROOT CANAL     s/p lipoma right scalp posteriorly  2011   Family History  Problem Relation Age of Onset   Cancer Mother        colon cancer   Colon cancer Father 19   Stroke Brother 41   Esophageal cancer Neg Hx    Stomach cancer Neg Hx    Rectal cancer Neg Hx    Social History   Socioeconomic History   Marital status: Married    Spouse name: Not on file   Number of children: Not on file   Years of education: Not on file   Highest education level: Not on file  Occupational History   Occupation: Geophysical data processor    Employer: [employer]  Tobacco Use   Smoking status: Never   Smokeless tobacco: Never  Vaping Use   Vaping Use: Never used  Substance and Sexual Activity   Alcohol use: Yes    Alcohol/week: 0.0 standard drinks of alcohol    Comment: occasional   Drug use: No   Sexual activity: Yes  Other Topics Concern   Not on file  Social History Narrative   Not on file   Social Determinants of Health   Financial Resource Strain: Low Risk  (06/08/2022)   Overall Financial Resource Strain (CARDIA)    Difficulty of Paying Living Expenses: Not hard at all  Food Insecurity: No Food Insecurity (06/08/2022)   Hunger Vital Sign    Worried About Running Out of Food in the Last Year: Never true    Ran Out of Food in the Last Year: Never true  Transportation Needs: No Transportation Needs (06/08/2022)   PRAPARE - Civil engineer, contracting (Medical): No    Lack of Transportation (Non-Medical): No  Physical Activity: Sufficiently Active (06/08/2022)   Exercise Vital Sign    Days of Exercise per Week: 5 days    Minutes of Exercise per Session: 30 min  Stress: No Stress Concern Present (06/08/2022)   [place]    Feeling of Stress : Not at all  Social Connections: [place] (06/08/2022)   Social Connection and Isolation Panel [NHANES]    Frequency of Communication with Friends and Family: More than three times a week    Frequency of Social Gatherings with Friends and Family: More than three times a week    Attends Religious Services: More than 4 times per year    Active  Member of Clubs or Organizations: Yes    Attends Music therapist: More than 4 times per year    Marital Status: Married    Tobacco Counseling Counseling given: Not Answered   Clinical Intake:  Pre-visit preparation completed: Yes  Pain : No/denies pain Pain Score: 0-No pain     BMI - recorded: 27.48 Nutritional Status: BMI 25 -29 Overweight Nutritional Risks: None Diabetes: No  How often do you need to have someone help you when you read instructions, pamphlets, or other written materials from your doctor or pharmacy?: 1 - Never What is the last grade level you completed in school?: HSG; 2 years at [employer]  Diabetic? no     Information entered by :: [employer], LPN.   Activities of Daily Living    06/08/2022    9:02 AM  In your present state of health, do you have any difficulty performing the following activities:  Hearing? 0  Vision? 0  Difficulty concentrating or making decisions? 0  Walking or climbing stairs? 0  Dressing or bathing? 0  Doing errands, shopping? 0  Preparing Food and eating ? N  Using the Toilet? N  In the past six months, have you accidently leaked urine? N  Do you have problems with loss of bowel  control? N  Managing your Medications? N  Managing your Finances? N  Housekeeping or managing your Housekeeping? N    Patient Care Team: Biagio Borg, MD as PCP - General Webb Laws, OD as Referring Physician (Optometry)  Indicate any recent Medical Services you may have received from other than [facility] providers in the past year (date may be approximate).     Assessment:   This is a routine wellness examination for Matthew Tucker.  Hearing/Vision screen Hearing Screening - Comments:: Denies hearing difficulties   Vision Screening - Comments:: Wears readers - up to date with routine eye exams with Webb Laws, OD.   Dietary issues and exercise activities discussed: Current Exercise Habits: Home exercise routine, Type of exercise: walking, Time (Minutes): 30, Frequency (Times/Week): 5, Weekly Exercise (Minutes/Week): 150, Intensity: Moderate, Exercise limited by: orthopedic condition(s)   Goals Addressed               This Visit's Progress     Client understands the importance of follow-up with providers by attending scheduled visits   On track     Patient Stated (pt-stated)   On track     To maintain my current health status by continuing to eat healthy and stay physically active & socially active.      Depression Screen    06/08/2022    9:02 AM 12/23/2021    3:07 PM 12/23/2021    2:36 PM 06/07/2021    9:27 AM 12/21/2020    9:48 AM 02/20/2020   10:03 AM 02/20/2020    8:41 AM  PHQ 2/9 Scores  PHQ - 2 Score 0 0 0 0 0 0 0    Fall Risk    06/08/2022    8:51 AM 12/23/2021    3:07 PM 12/23/2021    2:36 PM 06/07/2021    9:27 AM 12/21/2020    9:48 AM  Fall Risk   Falls in the past year? 0 0 0 0 0  Number falls in past yr: 0 0 0 0   Injury with Fall? 0 0 0 0   Risk for fall due to : No Fall Risks   No Fall Risks   Follow up Falls  prevention discussed   Falls evaluation completed     FALL RISK PREVENTION PERTAINING TO THE HOME:  Any stairs in or around the home? Yes   If so, are there any without handrails? No  Home free of loose throw rugs in walkways, pet beds, electrical cords, etc? Yes  Adequate lighting in your home to reduce risk of falls? Yes   ASSISTIVE DEVICES UTILIZED TO PREVENT FALLS:  Life alert? No  Use of a cane, walker or w/c? No  Grab bars in the bathroom? No  Shower chair or bench in shower? No  Elevated toilet seat or a handicapped toilet? No   TIMED UP AND GO:  Was the test performed? No . Phone Visit  Cognitive Function:        06/08/2022    9:06 AM 02/20/2020    8:43 AM  6CIT Screen  What Year? 0 points 0 points  What month? 0 points 0 points  What time? 0 points 0 points  Count back from 20 0 points 0 points  Months in reverse 0 points 0 points  Repeat phrase 0 points 0 points  Total Score 0 points 0 points    Immunizations Immunization History  Administered Date(s) Administered   Fluad Quad(high Dose 65+) 06/07/2021   Influenza Split 09/08/2011   Influenza Whole 06/12/2007   Influenza, High Dose Seasonal PF 05/20/2015, 08/22/2017, 05/17/2018   Influenza-Unspecified 06/10/2019   PFIZER(Purple Top)SARS-COV-2 Vaccination 11/10/2019, 12/11/2019, 06/21/2020, 03/08/2021   Pneumococcal Conjugate-13 01/29/2015   Pneumococcal Polysaccharide-23 08/22/2017   Td 01/04/2009   Tdap 02/20/2019   Zoster, Live 02/05/2014    TDAP status: Up to date  Flu Vaccine status: Due, Education has been provided regarding the importance of this vaccine. Advised may receive this vaccine at local pharmacy or Health Dept. Aware to provide a copy of the vaccination record if obtained from local pharmacy or Health Dept. Verbalized acceptance and understanding.  Pneumococcal vaccine status: Up to date  Covid-19 vaccine status: Completed vaccines  Qualifies for Shingles Vaccine? Yes   Zostavax completed Yes   Shingrix Completed?: No.    Education has been provided regarding the importance of this vaccine. Patient has been advised to  call insurance company to determine out of pocket expense if they have not yet received this vaccine. Advised may also receive vaccine at local pharmacy or Health Dept. Verbalized acceptance and understanding.  Screening Tests Health Maintenance  Topic Date Due   Zoster Vaccines- Shingrix (1 of 2) Never done   COVID-19 Vaccine (5 - Pfizer series) 05/03/2021   INFLUENZA VACCINE  04/11/2022   TETANUS/TDAP  02/19/2029   COLONOSCOPY (Pts 45-74yr Insurance coverage will need to be confirmed)  10/14/2031   Pneumonia Vaccine 71 Years old  Completed   Hepatitis C Screening  Completed   HPV VACCINES  Aged Out    Health Maintenance  Health Maintenance Due  Topic Date Due   Zoster Vaccines- Shingrix (1 of 2) Never done   COVID-19 Vaccine (5 - Pfizer series) 05/03/2021   INFLUENZA VACCINE  04/11/2022    Colorectal cancer screening: Type of screening: Colonoscopy. Completed 10/13/2021. Repeat every 0 years  Lung Cancer Screening: (Low Dose CT Chest recommended if Age 73-80years, 30 pack-year currently smoking OR have quit w/in 15years.) does not qualify.   Lung Cancer Screening Referral: no  Additional Screening:  Hepatitis C Screening: does qualify; Completed 05/30/2015  Vision Screening: Recommended annual ophthalmology exams for early detection of glaucoma and other disorders of the eye.  Is the patient up to date with their annual eye exam?  Yes  Who is the provider or what is the name of the office in which the patient attends annual eye exams? Howard McFarland, OD. If pt is not established with a provider, would they like to be referred to a provider to establish care? No .   Dental Screening: Recommended annual dental exams for proper oral hygiene  Community Resource Referral / Chronic Care Management: CRR required this visit?  No   CCM required this visit?  No      Plan:     I have personally reviewed and noted the following in the patient's chart:   Medical and social  history Use of alcohol, tobacco or illicit drugs  Current medications and supplements including opioid prescriptions. Patient is not currently taking opioid prescriptions. Functional ability and status Nutritional status Physical activity Advanced directives List of other physicians Hospitalizations, surgeries, and ER visits in previous 12 months Vitals Screenings to include cognitive, depression, and falls Referrals and appointments  In addition, I have reviewed and discussed with patient certain preventive protocols, quality metrics, and best practice recommendations. A written personalized care plan for preventive services as well as general preventive health recommendations were provided to patient.     Sheral Flow, LPN   2/37/6283   Nurse Notes: N/A

## 2022-07-11 DIAGNOSIS — M545 Low back pain, unspecified: Secondary | ICD-10-CM | POA: Diagnosis not present

## 2022-07-11 DIAGNOSIS — R269 Unspecified abnormalities of gait and mobility: Secondary | ICD-10-CM | POA: Diagnosis not present

## 2022-07-11 DIAGNOSIS — R200 Anesthesia of skin: Secondary | ICD-10-CM | POA: Diagnosis not present

## 2022-07-24 DIAGNOSIS — Z6828 Body mass index (BMI) 28.0-28.9, adult: Secondary | ICD-10-CM | POA: Diagnosis not present

## 2022-07-24 DIAGNOSIS — G5620 Lesion of ulnar nerve, unspecified upper limb: Secondary | ICD-10-CM | POA: Diagnosis not present

## 2022-07-24 DIAGNOSIS — M4802 Spinal stenosis, cervical region: Secondary | ICD-10-CM | POA: Diagnosis not present

## 2022-07-24 DIAGNOSIS — G5610 Other lesions of median nerve, unspecified upper limb: Secondary | ICD-10-CM | POA: Diagnosis not present

## 2022-07-26 ENCOUNTER — Other Ambulatory Visit: Payer: Self-pay | Admitting: Neurological Surgery

## 2022-08-01 ENCOUNTER — Other Ambulatory Visit: Payer: Self-pay | Admitting: Neurological Surgery

## 2022-08-09 NOTE — Pre-Procedure Instructions (Signed)
Surgical Instructions    Your procedure is scheduled on Monday, August 14, 2022 at 1:39 PM.  Report to [facility] Main Entrance "A" at 11:30 A.M., then check in with the Admitting office.  Call this number if you have problems the morning of surgery:  [phone]   If you have any questions prior to your surgery date call [phone]: Open Monday-Friday 8am-4pm  *If you experience any cold or flu symptoms such as cough, fever, chills, shortness of breath, etc. between now and your scheduled surgery, please notify us.*    Remember:  Do not eat or drink after midnight the night before your surgery    Take these medicines the morning of surgery with A SIP OF WATER:  rosuvastatin (CRESTOR)   Follow your surgeon's instructions on when to stop Aspirin.  If no instructions were given by your surgeon then you will need to call the office to get those instructions.     As of today, STOP taking any Aleve, Naproxen, Ibuprofen, Motrin, Advil, Goody's, BC's, all herbal medications, fish oil, and all vitamins.                     Do NOT Smoke (Tobacco/Vaping) for 24 hours prior to your procedure.  If you use a CPAP at night, you may bring your mask/headgear for your overnight stay.   Contacts, glasses, piercing's, hearing aid's, dentures or partials may not be worn into surgery, please bring cases for these belongings.    For patients admitted to the hospital, discharge time will be determined by your treatment team.   Patients discharged the day of surgery will not be allowed to drive home, and someone needs to stay with them for 24 hours.  SURGICAL WAITING ROOM VISITATION Patients having surgery or a procedure may have two support people in the waiting area. Visitors may stay in the waiting area during the procedure and switch out with other visitors if needed. Children under the age of 13 must have an adult accompany them who is not the patient. If the patient needs to stay at the  hospital during part of their recovery, the visitor guidelines for inpatient rooms apply.  Please refer to the [facility] website for the visitor guidelines for Inpatients (after your surgery is over and you are in a regular room).    Special instructions:   [facility]- Preparing For Surgery  Before surgery, you can play an important role. Because skin is not sterile, your skin needs to be as free of germs as possible. You can reduce the number of germs on your skin by washing with CHG (chlorahexidine gluconate) Soap before surgery.  CHG is an antiseptic cleaner which kills germs and bonds with the skin to continue killing germs even after washing.    Oral Hygiene is also important to reduce your risk of infection.  Remember - BRUSH YOUR TEETH THE MORNING OF SURGERY WITH YOUR REGULAR TOOTHPASTE  Please do not use if you have an allergy to CHG or antibacterial soaps. If your skin becomes reddened/irritated stop using the CHG.  Do not shave (including legs and underarms) for at least 48 hours prior to first CHG shower. It is OK to shave your face.  Please follow these instructions carefully.   Shower the NIGHT BEFORE SURGERY and the MORNING OF SURGERY  If you chose to wash your hair, wash your hair first as usual with your normal shampoo.  After you shampoo, rinse your hair and body  thoroughly to remove the shampoo.  Use CHG Soap as you would any other liquid soap. You can apply CHG directly to the skin and wash gently with a scrungie or a clean washcloth.   Apply the CHG Soap to your body ONLY FROM THE NECK DOWN.  Do not use on open wounds or open sores. Avoid contact with your eyes, ears, mouth and genitals (private parts). Wash Face and genitals (private parts)  with your normal soap.   Wash thoroughly, paying special attention to the area where your surgery will be performed.  Thoroughly rinse your body with warm water from the neck down.  DO NOT shower/wash with your normal soap  after using and rinsing off the CHG Soap.  Pat yourself dry with a CLEAN TOWEL.  Wear CLEAN PAJAMAS to bed the night before surgery  Place CLEAN SHEETS on your bed the night before your surgery  DO NOT SLEEP WITH PETS.   Day of Surgery: Take a shower with CHG soap. Do not wear jewelry. Do not wear lotions, powders, perfumes/colognes, or deodorant. Do not shave 48 hours prior to surgery.  Men may shave face and neck. Wear Clean/Comfortable clothing the morning of surgery Do not bring valuables to the hospital.  [facility] is not responsible for any belongings or valuables. Remember to brush your teeth WITH YOUR REGULAR TOOTHPASTE.   Please read over the following fact sheets that you were given.  If you received a COVID test during your pre-op visit  it is requested that you wear a mask when out in public, stay away from anyone that may not be feeling well and notify your surgeon if you develop symptoms. If you have been in contact with anyone that has tested positive in the last 10 days please notify you surgeon.

## 2022-08-10 ENCOUNTER — Other Ambulatory Visit: Payer: Self-pay

## 2022-08-10 ENCOUNTER — Encounter (HOSPITAL_COMMUNITY): Payer: Self-pay

## 2022-08-10 ENCOUNTER — Encounter (HOSPITAL_COMMUNITY)
Admission: RE | Admit: 2022-08-10 | Discharge: 2022-08-10 | Disposition: A | Discharge status: Home / Self Care | Payer: Medicare Other | Source: Ambulatory Visit | Attending: Neurological Surgery | Admitting: Neurological Surgery

## 2022-08-10 VITALS — BP 130/89 | HR 70 | Temp 97.7°F | Resp 18 | Ht 71.0 in | Wt 205.2 lb

## 2022-08-10 DIAGNOSIS — Z01812 Encounter for preprocedural laboratory examination: Secondary | ICD-10-CM | POA: Diagnosis not present

## 2022-08-10 DIAGNOSIS — Z01818 Encounter for other preprocedural examination: Secondary | ICD-10-CM

## 2022-08-10 LAB — CBC
HCT: 41.5 % (ref 39.0–52.0)
Hemoglobin: 13.7 g/dL (ref 13.0–17.0)
MCH: 30.6 pg (ref 26.0–34.0)
MCHC: 33 g/dL (ref 30.0–36.0)
MCV: 92.6 fL (ref 80.0–100.0)
Platelets: 159 10*3/uL (ref 150–400)
RBC: 4.48 MIL/uL (ref 4.22–5.81)
RDW: 11.8 % (ref 11.5–15.5)
WBC: 6.1 10*3/uL (ref 4.0–10.5)
nRBC: 0 % (ref 0.0–0.2)

## 2022-08-10 LAB — COMPREHENSIVE METABOLIC PANEL
ALT: 16 U/L (ref 0–44)
AST: 23 U/L (ref 15–41)
Albumin: 3.6 g/dL (ref 3.5–5.0)
Alkaline Phosphatase: 64 U/L (ref 38–126)
Anion gap: 10 (ref 5–15)
BUN: 13 mg/dL (ref 8–23)
CO2: 26 mmol/L (ref 22–32)
Calcium: 8.9 mg/dL (ref 8.9–10.3)
Chloride: 104 mmol/L (ref 98–111)
Creatinine, Ser: 1.22 mg/dL (ref 0.61–1.24)
GFR, Estimated: >60 mL/min (ref >60)
Glucose, Bld: 96 mg/dL (ref 70–99)
Potassium: 3.4 mmol/L — ABNORMAL LOW (ref 3.5–5.1)
Sodium: 140 mmol/L (ref 135–145)
Total Bilirubin: 0.7 mg/dL (ref 0.3–1.2)
Total Protein: 6.5 g/dL (ref 6.5–8.1)

## 2022-08-10 LAB — PROTIME-INR
INR: 1 (ref 0.8–1.2)
Prothrombin Time: 13.4 seconds (ref 11.4–15.2)

## 2022-08-10 LAB — SURGICAL PCR SCREEN
MRSA, PCR: NEGATIVE
Staphylococcus aureus: NEGATIVE

## 2022-08-10 NOTE — Progress Notes (Signed)
PCP - Dr. Cathlean Cower Cardiologist - Denies  PPM/ICD - Denies   Chest x-ray - N/A EKG - N/A Stress Test - Denies ECHO - 05/20/18 Cardiac Cath - Denies  Sleep Study - Denies  Diabetes: Denies  Blood Thinner Instructions: N/A Aspirin Instructions: Last dose 08/06/22  ERAS Protcol - No  COVID TEST- N/A   Anesthesia review: No  Patient denies shortness of breath, fever, cough and chest pain at PAT appointment   All instructions explained to the patient, with a verbal understanding of the material. Patient agrees to go over the instructions while at home for a better understanding. Patient also instructed to self quarantine after being tested for COVID-19. The opportunity to ask questions was provided.

## 2022-08-13 NOTE — Anesthesia Preprocedure Evaluation (Signed)
Anesthesia Evaluation  Patient identified by MRN, date of birth, ID band Patient awake    Reviewed: Allergy & Precautions, NPO status , Patient's Chart, lab work & pertinent test results  History of Anesthesia Complications Negative for: history of anesthetic complications  Airway Mallampati: II  TM Distance: >3 FB Neck ROM: Full    Dental  (+) Dental Advisory Given, Teeth Intact,    Pulmonary shortness of breath   Pulmonary exam normal breath sounds clear to auscultation       Cardiovascular negative cardio ROS  Rhythm:Regular Rate:Normal  Echo 2019 - Left ventricle: The cavity size was normal. Wall thickness was increased in a pattern of mild LVH. Systolic function was normal. The estimated ejection fraction was 55%. Wall motion was normal;  there were no regional wall motion abnormalities. Features are  consistent with a pseudonormal left ventricular filling pattern,  with concomitant abnormal relaxation and increased filling  pressure (grade 2 diastolic dysfunction).  - Aortic valve: There was no stenosis. There was trivial  regurgitation.  - Aorta: Dilated aortic root. Aortic root dimension: 41 mm (ED).  Ascending aortic diameter: 38 mm (S).  - Mitral valve: There was trivial regurgitation.  - Right ventricle: The cavity size was normal. Systolic function was normal.  - Right atrium: The atrium was mildly dilated.  - Pulmonary arteries: No complete TR doppler jet so unable to  estimate PA systolic pressure.  - Inferior vena cava: The vessel was normal in size. The  respirophasic diameter changes were in the normal range (>= 50%),  consistent with normal central venous pressure.   Impressions:  - Normal LV size with mild LV hypertrophy. EF 55%. Moderate   diastolic dysfunction. Normal RV size and systolic function. No   significant valvular abnormalities.     Neuro/Psych   Anxiety      Neuromuscular disease     GI/Hepatic negative GI ROS, Neg liver ROS,,,  Endo/Other  negative endocrine ROS    Renal/GU Renal InsufficiencyRenal disease     Musculoskeletal  (+) Arthritis ,    Abdominal   Peds  Hematology negative hematology ROS (+)   Anesthesia Other Findings Day of surgery medications reviewed with patient.  Reproductive/Obstetrics negative OB ROS                             Anesthesia Physical Anesthesia Plan  ASA: 3  Anesthesia Plan: General   Post-op Pain Management: Tylenol PO (pre-op)* and Gabapentin PO (pre-op)*   Induction: Intravenous  PONV Risk Score and Plan: 3 and Treatment may vary due to age or medical condition, Ondansetron and Dexamethasone  Airway Management Planned: Oral ETT and Video Laryngoscope Planned  Additional Equipment: ClearSight  Intra-op Plan:   Post-operative Plan: Extubation in OR  Informed Consent: I have reviewed the patients History and Physical, chart, labs and discussed the procedure including the risks, benefits and alternatives for the proposed anesthesia with the patient or authorized representative who has indicated his/her understanding and acceptance.     Dental advisory given  Plan Discussed with: CRNA  Anesthesia Plan Comments:         Anesthesia Quick Evaluation

## 2022-08-14 ENCOUNTER — Ambulatory Visit (HOSPITAL_COMMUNITY): Payer: Medicare Other | Admitting: Anesthesiology

## 2022-08-14 ENCOUNTER — Encounter (HOSPITAL_COMMUNITY)
Admission: RE | Disposition: A | Discharge status: Home / Self Care | Payer: Self-pay | Source: Home / Self Care | Attending: Neurological Surgery

## 2022-08-14 ENCOUNTER — Other Ambulatory Visit: Payer: Self-pay

## 2022-08-14 ENCOUNTER — Encounter (HOSPITAL_COMMUNITY): Payer: Self-pay | Admitting: Neurological Surgery

## 2022-08-14 ENCOUNTER — Ambulatory Visit (HOSPITAL_BASED_OUTPATIENT_CLINIC_OR_DEPARTMENT_OTHER): Payer: Medicare Other | Admitting: Anesthesiology

## 2022-08-14 ENCOUNTER — Observation Stay (HOSPITAL_COMMUNITY)
Admission: RE | Admit: 2022-08-14 | Discharge: 2022-08-15 | Disposition: A | Discharge status: Home / Self Care | Payer: Medicare Other | Attending: Neurological Surgery | Admitting: Neurological Surgery

## 2022-08-14 ENCOUNTER — Ambulatory Visit (HOSPITAL_COMMUNITY): Payer: Medicare Other

## 2022-08-14 DIAGNOSIS — G5621 Lesion of ulnar nerve, right upper limb: Secondary | ICD-10-CM | POA: Insufficient documentation

## 2022-08-14 DIAGNOSIS — M4712 Other spondylosis with myelopathy, cervical region: Secondary | ICD-10-CM | POA: Diagnosis not present

## 2022-08-14 DIAGNOSIS — F419 Anxiety disorder, unspecified: Secondary | ICD-10-CM | POA: Diagnosis not present

## 2022-08-14 DIAGNOSIS — G5601 Carpal tunnel syndrome, right upper limb: Secondary | ICD-10-CM | POA: Diagnosis not present

## 2022-08-14 DIAGNOSIS — I5030 Unspecified diastolic (congestive) heart failure: Secondary | ICD-10-CM

## 2022-08-14 DIAGNOSIS — Z981 Arthrodesis status: Secondary | ICD-10-CM

## 2022-08-14 DIAGNOSIS — M4802 Spinal stenosis, cervical region: Secondary | ICD-10-CM | POA: Insufficient documentation

## 2022-08-14 HISTORY — PX: ANTERIOR CERVICAL DECOMP/DISCECTOMY FUSION: SHX1161

## 2022-08-14 HISTORY — PX: CARPAL TUNNEL RELEASE: SHX101

## 2022-08-14 HISTORY — PX: ULNAR NERVE TRANSPOSITION: SHX2595

## 2022-08-14 SURGERY — ANTERIOR CERVICAL DECOMPRESSION/DISCECTOMY FUSION 1 LEVEL
Anesthesia: General | Laterality: Right

## 2022-08-14 MED ORDER — CHLORHEXIDINE GLUCONATE 0.12 % MT SOLN
OROMUCOSAL | Status: AC
  Administered 2022-08-14: 15 mL via OROMUCOSAL
  Filled 2022-08-14: qty 15

## 2022-08-14 MED ORDER — CHLORHEXIDINE GLUCONATE CLOTH 2 % EX PADS: 6.0000 | MEDICATED_PAD | Freq: Once | CUTANEOUS | Status: DC

## 2022-08-14 MED ORDER — SODIUM CHLORIDE 0.9 % IV SOLN: 250.0000 mL | INTRAVENOUS | Status: DC

## 2022-08-14 MED ORDER — PHENOL 1.4 % MT LIQD: 1.0000 | OROMUCOSAL | Status: DC | PRN

## 2022-08-14 MED ORDER — HYDROMORPHONE HCL 1 MG/ML IJ SOLN
INTRAMUSCULAR | Status: AC
  Filled 2022-08-14: qty 1

## 2022-08-14 MED ORDER — SODIUM CHLORIDE 0.9% FLUSH
3.0000 mL | Freq: Two times a day (BID) | INTRAVENOUS | Status: DC
  Administered 2022-08-14: 3 mL via INTRAVENOUS

## 2022-08-14 MED ORDER — ONDANSETRON HCL 4 MG PO TABS: 4.0000 mg | ORAL_TABLET | Freq: Four times a day (QID) | ORAL | Status: DC | PRN

## 2022-08-14 MED ORDER — CEFAZOLIN SODIUM-DEXTROSE 2-4 GM/100ML-% IV SOLN
INTRAVENOUS | Status: AC
  Filled 2022-08-14: qty 100

## 2022-08-14 MED ORDER — ONDANSETRON HCL 4 MG/2ML IJ SOLN: 4.0000 mg | Freq: Four times a day (QID) | INTRAMUSCULAR | Status: DC | PRN

## 2022-08-14 MED ORDER — MORPHINE SULFATE (PF) 2 MG/ML IV SOLN: 2.0000 mg | INTRAVENOUS | Status: DC | PRN

## 2022-08-14 MED ORDER — ORAL CARE MOUTH RINSE: 15.0000 mL | Freq: Once | OROMUCOSAL | Status: AC

## 2022-08-14 MED ORDER — ROCURONIUM BROMIDE 10 MG/ML (PF) SYRINGE
PREFILLED_SYRINGE | INTRAVENOUS | Status: AC
  Filled 2022-08-14: qty 30

## 2022-08-14 MED ORDER — POTASSIUM CHLORIDE IN NACL 20-0.9 MEQ/L-% IV SOLN
INTRAVENOUS | Status: DC
  Filled 2022-08-14: qty 1000

## 2022-08-14 MED ORDER — ACETAMINOPHEN 500 MG PO TABS
1000.0000 mg | ORAL_TABLET | Freq: Once | ORAL | Status: AC
  Filled 2022-08-14: qty 2

## 2022-08-14 MED ORDER — CEFAZOLIN SODIUM-DEXTROSE 2-4 GM/100ML-% IV SOLN
2.0000 g | Freq: Three times a day (TID) | INTRAVENOUS | Status: AC
  Administered 2022-08-14 – 2022-08-15 (×2): 2 g via INTRAVENOUS
  Filled 2022-08-14 (×2): qty 100

## 2022-08-14 MED ORDER — BUPIVACAINE HCL (PF) 0.25 % IJ SOLN
INTRAMUSCULAR | Status: DC | PRN
  Administered 2022-08-14: 10 mL

## 2022-08-14 MED ORDER — FENTANYL CITRATE (PF) 250 MCG/5ML IJ SOLN
INTRAMUSCULAR | Status: AC
  Filled 2022-08-14: qty 5

## 2022-08-14 MED ORDER — CEFAZOLIN SODIUM-DEXTROSE 2-4 GM/100ML-% IV SOLN: 2.0000 g | INTRAVENOUS | Status: DC

## 2022-08-14 MED ORDER — SUGAMMADEX SODIUM 200 MG/2ML IV SOLN
INTRAVENOUS | Status: DC | PRN
  Administered 2022-08-14 (×2): 200 mg via INTRAVENOUS

## 2022-08-14 MED ORDER — OXYCODONE HCL 5 MG PO TABS
10.0000 mg | ORAL_TABLET | ORAL | Status: DC | PRN
  Administered 2022-08-14 – 2022-08-15 (×3): 10 mg via ORAL
  Filled 2022-08-14 (×2): qty 2

## 2022-08-14 MED ORDER — AMISULPRIDE (ANTIEMETIC) 5 MG/2ML IV SOLN: 10.0000 mg | Freq: Once | INTRAVENOUS | Status: DC | PRN

## 2022-08-14 MED ORDER — CEFAZOLIN SODIUM-DEXTROSE 2-4 GM/100ML-% IV SOLN
2.0000 g | INTRAVENOUS | Status: AC
  Administered 2022-08-14: 2 g via INTRAVENOUS

## 2022-08-14 MED ORDER — MENTHOL 3 MG MT LOZG: 1.0000 | LOZENGE | OROMUCOSAL | Status: DC | PRN

## 2022-08-14 MED ORDER — ONDANSETRON HCL 4 MG/2ML IJ SOLN
INTRAMUSCULAR | Status: DC | PRN
  Administered 2022-08-14: 4 mg via INTRAVENOUS

## 2022-08-14 MED ORDER — LIDOCAINE 2% (20 MG/ML) 5 ML SYRINGE
INTRAMUSCULAR | Status: AC
  Filled 2022-08-14: qty 15

## 2022-08-14 MED ORDER — FENTANYL CITRATE (PF) 250 MCG/5ML IJ SOLN
INTRAMUSCULAR | Status: DC | PRN
  Administered 2022-08-14: 50 ug via INTRAVENOUS
  Administered 2022-08-14: 150 ug via INTRAVENOUS

## 2022-08-14 MED ORDER — OXYCODONE HCL 5 MG PO TABS
ORAL_TABLET | ORAL | Status: AC
  Filled 2022-08-14: qty 2

## 2022-08-14 MED ORDER — DEXAMETHASONE SODIUM PHOSPHATE 10 MG/ML IJ SOLN
INTRAMUSCULAR | Status: AC
  Filled 2022-08-14: qty 3

## 2022-08-14 MED ORDER — METHOCARBAMOL 500 MG PO TABS
500.0000 mg | ORAL_TABLET | Freq: Four times a day (QID) | ORAL | Status: DC | PRN
  Administered 2022-08-14 – 2022-08-15 (×2): 500 mg via ORAL
  Filled 2022-08-14 (×2): qty 1

## 2022-08-14 MED ORDER — ROCURONIUM BROMIDE 10 MG/ML (PF) SYRINGE
PREFILLED_SYRINGE | INTRAVENOUS | Status: DC | PRN
  Administered 2022-08-14: 60 mg via INTRAVENOUS
  Administered 2022-08-14: 40 mg via INTRAVENOUS

## 2022-08-14 MED ORDER — DEXAMETHASONE 4 MG PO TABS
4.0000 mg | ORAL_TABLET | Freq: Four times a day (QID) | ORAL | Status: DC
  Administered 2022-08-14 – 2022-08-15 (×3): 4 mg via ORAL
  Filled 2022-08-14 (×3): qty 1

## 2022-08-14 MED ORDER — SENNA 8.6 MG PO TABS
1.0000 | ORAL_TABLET | Freq: Two times a day (BID) | ORAL | Status: DC
  Administered 2022-08-14 – 2022-08-15 (×2): 8.6 mg via ORAL
  Filled 2022-08-14 (×2): qty 1

## 2022-08-14 MED ORDER — 0.9 % SODIUM CHLORIDE (POUR BTL) OPTIME
TOPICAL | Status: DC | PRN
  Administered 2022-08-14: 1000 mL

## 2022-08-14 MED ORDER — ONDANSETRON HCL 4 MG/2ML IJ SOLN
INTRAMUSCULAR | Status: AC
  Filled 2022-08-14: qty 8

## 2022-08-14 MED ORDER — PHENYLEPHRINE 80 MCG/ML (10ML) SYRINGE FOR IV PUSH (FOR BLOOD PRESSURE SUPPORT)
PREFILLED_SYRINGE | INTRAVENOUS | Status: DC | PRN
  Administered 2022-08-14: 80 ug via INTRAVENOUS

## 2022-08-14 MED ORDER — CHLORHEXIDINE GLUCONATE 0.12 % MT SOLN: 15.0000 mL | Freq: Once | OROMUCOSAL | Status: AC

## 2022-08-14 MED ORDER — PHENYLEPHRINE HCL-NACL 20-0.9 MG/250ML-% IV SOLN
INTRAVENOUS | Status: DC | PRN
  Administered 2022-08-14: 25 ug/min via INTRAVENOUS

## 2022-08-14 MED ORDER — ACETAMINOPHEN 500 MG PO TABS
1000.0000 mg | ORAL_TABLET | ORAL | Status: AC
  Administered 2022-08-14: 1000 mg via ORAL

## 2022-08-14 MED ORDER — ACETAMINOPHEN 500 MG PO TABS
1000.0000 mg | ORAL_TABLET | Freq: Four times a day (QID) | ORAL | Status: DC
  Administered 2022-08-14 – 2022-08-15 (×3): 1000 mg via ORAL
  Filled 2022-08-14 (×3): qty 2

## 2022-08-14 MED ORDER — GABAPENTIN 300 MG PO CAPS: 300.0000 mg | ORAL_CAPSULE | ORAL | Status: AC

## 2022-08-14 MED ORDER — SODIUM CHLORIDE 0.9% FLUSH: 3.0000 mL | INTRAVENOUS | Status: DC | PRN

## 2022-08-14 MED ORDER — PROPOFOL 10 MG/ML IV BOLUS
INTRAVENOUS | Status: AC
  Filled 2022-08-14: qty 20

## 2022-08-14 MED ORDER — ACETAMINOPHEN 500 MG PO TABS: 1000.0000 mg | ORAL_TABLET | ORAL | Status: AC

## 2022-08-14 MED ORDER — HYDROMORPHONE HCL 1 MG/ML IJ SOLN
0.2500 mg | INTRAMUSCULAR | Status: DC | PRN
  Administered 2022-08-14 (×3): 0.5 mg via INTRAVENOUS

## 2022-08-14 MED ORDER — THROMBIN 5000 UNITS EX SOLR
CUTANEOUS | Status: AC
  Filled 2022-08-14: qty 5000

## 2022-08-14 MED ORDER — GABAPENTIN 300 MG PO CAPS
ORAL_CAPSULE | ORAL | Status: AC
  Administered 2022-08-14: 300 mg via ORAL
  Filled 2022-08-14: qty 1

## 2022-08-14 MED ORDER — METHOCARBAMOL 1000 MG/10ML IJ SOLN: 500.0000 mg | Freq: Four times a day (QID) | INTRAVENOUS | Status: DC | PRN

## 2022-08-14 MED ORDER — DEXAMETHASONE SODIUM PHOSPHATE 4 MG/ML IJ SOLN
4.0000 mg | Freq: Four times a day (QID) | INTRAMUSCULAR | Status: DC
  Filled 2022-08-14 (×5): qty 1

## 2022-08-14 MED ORDER — PROPOFOL 10 MG/ML IV BOLUS
INTRAVENOUS | Status: DC | PRN
  Administered 2022-08-14: 150 mg via INTRAVENOUS
  Administered 2022-08-14: 50 mg via INTRAVENOUS

## 2022-08-14 MED ORDER — THROMBIN 5000 UNITS EX SOLR: OROMUCOSAL | Status: DC | PRN

## 2022-08-14 MED ORDER — GABAPENTIN 300 MG PO CAPS: 300.0000 mg | ORAL_CAPSULE | ORAL | Status: DC

## 2022-08-14 MED ORDER — LACTATED RINGERS IV SOLN: INTRAVENOUS | Status: DC

## 2022-08-14 MED ORDER — LIDOCAINE 2% (20 MG/ML) 5 ML SYRINGE
INTRAMUSCULAR | Status: DC | PRN
  Administered 2022-08-14: 80 mg via INTRAVENOUS

## 2022-08-14 SURGICAL SUPPLY — 68 items
BAG COUNTER SPONGE SURGICOUNT (BAG) ×4 IMPLANT
BAND RUBBER #18 3X1/16 STRL (MISCELLANEOUS) ×4 IMPLANT
BASKET BONE COLLECTION (BASKET) IMPLANT
BENZOIN TINCTURE PRP APPL 2/3 (GAUZE/BANDAGES/DRESSINGS) ×4 IMPLANT
BIT DRILL 2.3X12 (BIT) IMPLANT
BLADE SURG 15 STRL LF DISP TIS (BLADE) ×2 IMPLANT
BLADE SURG 15 STRL SS (BLADE) ×2
BNDG ELASTIC 4X5.8 VLCR STR LF (GAUZE/BANDAGES/DRESSINGS) ×2 IMPLANT
BNDG GAUZE DERMACEA FLUFF 4 (GAUZE/BANDAGES/DRESSINGS) ×2 IMPLANT
BUR CARBIDE MATCH 3.0 (BURR) ×2 IMPLANT
CABLE BIPOLOR RESECTION CORD (MISCELLANEOUS) ×2 IMPLANT
CANISTER SUCT 3000ML PPV (MISCELLANEOUS) ×4 IMPLANT
DRAPE C-ARM 42X72 X-RAY (DRAPES) ×4 IMPLANT
DRAPE EXTREMITY T 121X128X90 (DISPOSABLE) ×2 IMPLANT
DRAPE HALF SHEET 40X57 (DRAPES) ×2 IMPLANT
DRAPE LAPAROTOMY 100X72 PEDS (DRAPES) ×2 IMPLANT
DRAPE MICROSCOPE SLANT 54X150 (MISCELLANEOUS) ×2 IMPLANT
DURAPREP 26ML APPLICATOR (WOUND CARE) ×2 IMPLANT
DURAPREP 6ML APPLICATOR 50/CS (WOUND CARE) ×2 IMPLANT
ELECT CAUTERY BLADE 6.4 (BLADE) ×2 IMPLANT
ELECT COATED BLADE 2.86 ST (ELECTRODE) ×2 IMPLANT
ELECT REM PT RETURN 9FT ADLT (ELECTROSURGICAL) ×4
ELECTRODE REM PT RTRN 9FT ADLT (ELECTROSURGICAL) ×4 IMPLANT
GAUZE 4X4 16PLY ~~LOC~~+RFID DBL (SPONGE) ×2 IMPLANT
GAUZE SPONGE 4X4 12PLY STRL (GAUZE/BANDAGES/DRESSINGS) ×2 IMPLANT
GLOVE BIO SURGEON STRL SZ7 (GLOVE) IMPLANT
GLOVE BIO SURGEON STRL SZ8 (GLOVE) ×4 IMPLANT
GLOVE BIOGEL PI IND STRL 7.0 (GLOVE) IMPLANT
GOWN STRL REUS W/ TWL LRG LVL3 (GOWN DISPOSABLE) IMPLANT
GOWN STRL REUS W/ TWL XL LVL3 (GOWN DISPOSABLE) IMPLANT
GOWN STRL REUS W/TWL 2XL LVL3 (GOWN DISPOSABLE) ×4 IMPLANT
GOWN STRL REUS W/TWL LRG LVL3 (GOWN DISPOSABLE)
GOWN STRL REUS W/TWL XL LVL3 (GOWN DISPOSABLE)
HEMOSTAT POWDER KIT SURGIFOAM (HEMOSTASIS) ×2 IMPLANT
KIT BASIN OR (CUSTOM PROCEDURE TRAY) ×4 IMPLANT
KIT TURNOVER KIT B (KITS) ×4 IMPLANT
NDL HYPO 25X1 1.5 SAFETY (NEEDLE) ×4 IMPLANT
NDL SPNL 20GX3.5 QUINCKE YW (NEEDLE) ×2 IMPLANT
NEEDLE HYPO 25X1 1.5 SAFETY (NEEDLE) ×4 IMPLANT
NEEDLE SPNL 20GX3.5 QUINCKE YW (NEEDLE) ×2 IMPLANT
NS IRRIG 1000ML POUR BTL (IV SOLUTION) ×4 IMPLANT
PACK BASIC III (CUSTOM PROCEDURE TRAY) ×2
PACK LAMINECTOMY NEURO (CUSTOM PROCEDURE TRAY) ×2 IMPLANT
PACK SRG BSC III STRL LF ECLPS (CUSTOM PROCEDURE TRAY) ×2 IMPLANT
PAD ARMBOARD 7.5X6 YLW CONV (MISCELLANEOUS) ×4 IMPLANT
PENCIL BUTTON HOLSTER BLD 10FT (ELECTRODE) ×2 IMPLANT
PIN DISTRACTION 14MM (PIN) IMPLANT
PLATE LOCK INSIGNIA 24 1L (Plate) IMPLANT
PUTTY BONE 1CC (Putty) IMPLANT
SCREW VA SINGLE LEAD 4X16 (Screw) IMPLANT
SPACER IDENT 9X16X14 7D (Spacer) IMPLANT
SPACER IDENTITI 9X16X14 7D (Spacer) ×2 IMPLANT
SPONGE INTESTINAL PEANUT (DISPOSABLE) ×2 IMPLANT
SPONGE SURGIFOAM ABS GEL SZ50 (HEMOSTASIS) IMPLANT
STOCKINETTE 4X48 STRL (DRAPES) ×2 IMPLANT
STRIP CLOSURE SKIN 1/2X4 (GAUZE/BANDAGES/DRESSINGS) ×4 IMPLANT
SUT ETHILON 4 0 PS 2 18 (SUTURE) ×2 IMPLANT
SUT VIC AB 2-0 CP2 18 (SUTURE) ×4 IMPLANT
SUT VIC AB 3-0 SH 8-18 (SUTURE) ×4 IMPLANT
SUT VICRYL 4-0 PS2 18IN ABS (SUTURE) IMPLANT
SYR BULB EAR ULCER 3OZ GRN STR (SYRINGE) ×2 IMPLANT
SYR BULB IRRIG 60ML STRL (SYRINGE) ×2 IMPLANT
SYR CONTROL 10ML LL (SYRINGE) ×2 IMPLANT
TOWEL GREEN STERILE (TOWEL DISPOSABLE) ×4 IMPLANT
TOWEL GREEN STERILE FF (TOWEL DISPOSABLE) ×4 IMPLANT
TUBE CONNECTING 12X1/4 (SUCTIONS) ×2 IMPLANT
UNDERPAD 30X36 HEAVY ABSORB (UNDERPADS AND DIAPERS) ×2 IMPLANT
WATER STERILE IRR 1000ML POUR (IV SOLUTION) ×4 IMPLANT

## 2022-08-14 NOTE — H&P (Signed)
Subjective:   Patient is a 71 y.o. male admitted for difficulty with gait and numbness and tingling in the right arm and hand. The patient first presented to me with complaints of neck pain, shooting pains in the arm(s), and numbness of the arm(s). Onset of symptoms was several months ago. The pain is described as aching and occurs all day. The pain is rated moderate, and is located in the neck and radiates to the shoulders and right arm. The symptoms have been progressive. Symptoms are exacerbated by extending head backwards, and are relieved by none.  Previous work up includes MRI of cervical spine, results: spinal stenosis and EMG of upper extremity, results: Abnormal -right median neuropathy at the wrist and right ulnar neuropathy at the elbow.  Past Medical History:  Diagnosis Date   Arthritis    Neck, bilateral hands   BACK PAIN, CHRONIC 10/31/2010   Cervical stenosis of spine    Chronic lumbar radiculopathy 12/13/100   Diastolic dysfunction 04/05/3663   Family history of colon cancer 2013-02-05   Father died at 18yo   HYPERLIPIDEMIA 09/21/2007   Impaired glucose tolerance 08/29/2011   PARESTHESIA 2007/09/21   Toxic effect of chlorine gas(987.6) 10/31/2010    Past Surgical History:  Procedure Laterality Date   BIOPSY  10/13/2021   Procedure: BIOPSY;  Surgeon: Mauri Pole, MD;  Location: [facility];  Service: Endoscopy;;   BUNIONECTOMY     COLONOSCOPY     COLONOSCOPY WITH PROPOFOL N/A 10/13/2021   Procedure: COLONOSCOPY WITH PROPOFOL;  Surgeon: Mauri Pole, MD;  Location: [facility];  Service: Endoscopy;  Laterality: N/A;   FOOT SURGERY Right    LUMBAR LAMINECTOMY/DECOMPRESSION MICRODISCECTOMY N/A 06/20/2018   Procedure: Laminectomy and Foraminotomy - Lumbar one-Lumbar two - Lumbar two-Lumbar three - Lumbar three-Lumbar four - Lumbar four-Lumbar five;  Surgeon: Eustace Moore, MD;  Location: [place];  Service: Neurosurgery;  Laterality: N/A;   mass removal     back,  forehead; benign (lipoma)    mass removal  2011   head; benign   POLYPECTOMY  10/13/2021   Procedure: POLYPECTOMY;  Surgeon: Mauri Pole, MD;  Location: [facility];  Service: Endoscopy;;   POSTERIOR CERVICAL FUSION/FORAMINOTOMY N/A 01/03/2018   Procedure: Posterior Cervical Fusion with lateral mass fixation - Cervical three - Cervical seven, cervical laminectomy Cervical three-cervical seven;  Surgeon: Eustace Moore, MD;  Location: [place];  Service: Neurosurgery;  Laterality: N/A;   ROOT CANAL     s/p lipoma right scalp posteriorly  2011    Allergies  Allergen Reactions   Other Other (See Comments)    Nivaquine. Found in [place] and [place] for Malaria treatment  UNSPECIFIED REACTION    Levofloxacin Other (See Comments)    Irritates gums    Social History   Tobacco Use   Smoking status: Never   Smokeless tobacco: Never  Substance Use Topics   Alcohol use: Yes    Alcohol/week: 0.0 standard drinks of alcohol    Comment: occasional    Family History  Problem Relation Age of Onset   Cancer Mother        colon cancer   Colon cancer Father 31   Stroke Brother 63   Esophageal cancer Neg Hx    Stomach cancer Neg Hx    Rectal cancer Neg Hx    Prior to Admission medications   Medication Sig Start Date End Date Taking? Authorizing Provider  aspirin EC 81 MG tablet Take 81 mg by mouth daily.  Yes [provider]  furosemide (LASIX) 20 MG tablet TAKE 1 TABLET(20 MG) BY MOUTH DAILY AS NEEDED 12/23/21  Yes Biagio Borg, MD  ibuprofen (ADVIL) 600 MG tablet TAKE 1 TABLET(600 MG) BY MOUTH EVERY 8 HOURS AS NEEDED 02/27/22  Yes Biagio Borg, MD  potassium chloride (KLOR-CON) 10 MEQ tablet TAKE 2 TABLETS(20 MEQ) BY MOUTH DAILY as needed with fluid pill 12/23/21  Yes Biagio Borg, MD  rosuvastatin (CRESTOR) 20 MG tablet TAKE 1 TABLET(20 MG) BY MOUTH DAILY 12/23/21  Yes Biagio Borg, MD  sildenafil (VIAGRA) 100 MG tablet Take 0.5-1 tablets (50-100 mg total) by mouth daily as  needed for erectile dysfunction. 08/22/17  Yes Biagio Borg, MD  cyclobenzaprine (FLEXERIL) 5 MG tablet Take 1 tablet (5 mg total) by mouth 3 (three) times daily as needed for muscle spasms. Patient not taking: Reported on 08/08/2022 12/23/21   Biagio Borg, MD     Review of Systems  Positive ROS: neg  All other systems have been reviewed and were otherwise negative with the exception of those mentioned in the HPI and as above.  Objective: Vital signs in last 24 hours: Temp:  [97.9 F (36.6 C)] 97.9 F (36.6 C) (12/04 1147) Pulse Rate:  [69] 69 (12/04 1147) Resp:  [18] 18 (12/04 1147) BP: (158)/(92) 158/92 (12/04 1147) SpO2:  [97 %] 97 % (12/04 1147) Weight:  [88.9 kg] 88.9 kg (12/04 1147)  General Appearance: Alert, cooperative, no distress, appears stated age Head: Normocephalic, without obvious abnormality, atraumatic Eyes: PERRL, conjunctiva/corneas clear, EOM's intact      Neck: Supple, symmetrical, trachea midline, Back: Symmetric, no curvature, ROM normal, no CVA tenderness Lungs:  respirations unlabored Heart: Regular rate and rhythm Abdomen: Soft, non-tender Extremities: Extremities normal, atraumatic, no cyanosis or edema Pulses: 2+ and symmetric all extremities Skin: Skin color, texture, turgor normal, no rashes or lesions  NEUROLOGIC:  Mental status: Alert and oriented x4, no aphasia, good attention span, fund of knowledge and memory  Motor Exam - grossly normal except for some weakness and atrophy of the right hand intrinsics Sensory Exam - grossly normal Reflexes: 3+ Coordination - grossly normal Gait - grossly normal Balance - grossly normal Cranial Nerves: I: smell Not tested  II: visual acuity  OS: nl    OD: nl  II: visual fields Full to confrontation  II: pupils Equal, round, reactive to light  III,VII: ptosis None  III,IV,VI: extraocular muscles  Full ROM  V: mastication Normal  V: facial light touch sensation  Normal  V,VII: corneal reflex   Present  VII: facial muscle function - upper  Normal  VII: facial muscle function - lower Normal  VIII: hearing Not tested  IX: soft palate elevation  Normal  IX,X: gag reflex Present  XI: trapezius strength  5/5  XI: sternocleidomastoid strength 5/5  XI: neck flexion strength  5/5  XII: tongue strength  Normal    Data Review Lab Results  Component Value Date   WBC 6.1 08/10/2022   HGB 13.7 08/10/2022   HCT 41.5 08/10/2022   MCV 92.6 08/10/2022   PLT 159 08/10/2022   Lab Results  Component Value Date   NA 140 08/10/2022   K 3.4 (L) 08/10/2022   CL 104 08/10/2022   CO2 26 08/10/2022   BUN 13 08/10/2022   CREATININE 1.22 08/10/2022   GLUCOSE 96 08/10/2022   Lab Results  Component Value Date   INR 1.0 08/10/2022    Assessment:  Cervical neck pain with herniated nucleus pulposus/ spondylosis/ stenosis at C2-3 as well as a right ulnar neuropathy and median neuropathy. Estimated body mass index is 27.34 kg/m as calculated from the following:   Height as of this encounter: '5\' 11"'$  (1.803 m).   Weight as of this encounter: 88.9 kg.  Patient has failed conservative therapy. Planned surgery : ACDF with plating C2-3, right carpal tunnel release, right ulnar nerve release at the elbow  Plan:   I explained the condition and procedure to the patient and answered any questions.  Patient wishes to proceed with procedure as planned. Understands risks/ benefits/ and expected or typical outcomes.  Eustace Moore 08/14/2022 12:50 PM

## 2022-08-14 NOTE — Transfer of Care (Signed)
Immediate Anesthesia Transfer of Care Note  Patient: Matthew Tucker  Procedure(s) Performed: ACDF - C2-C3 Right ulnar release (Right) Right carpal tunnel release (Right)  Patient Location: PACU  Anesthesia Type:General  Level of Consciousness: drowsy  Airway & Oxygen Therapy: Patient Spontanous Breathing and Patient connected to nasal cannula oxygen  Post-op Assessment: Report given to RN and Post -op Vital signs reviewed and stable  Post vital signs: Reviewed and stable  Last Vitals:  Vitals Value Taken Time  BP 170/79 08/14/22 1717  Temp    Pulse 65 08/14/22 1719  Resp 13 08/14/22 1719  SpO2 98 % 08/14/22 1719  Vitals shown include unvalidated device data.  Last Pain:  Vitals:   08/14/22 1209  TempSrc:   PainSc: 7          Complications: No notable events documented.

## 2022-08-14 NOTE — Op Note (Signed)
08/14/2022  5:06 PM  PATIENT:  Matthew Tucker  71 y.o. male  PRE-OPERATIVE DIAGNOSIS: Adjacent level cervical spondylosis with stenosis C2-3 with myelopathy, right carpal tunnel syndrome, right ulnar neuropathy  POST-OPERATIVE DIAGNOSIS:  same  PROCEDURE:  1. Decompressive anterior cervical discectomy C2-3, 2. Anterior cervical arthrodesis C2-3 utilizing a PTI interbody cage packed with locally harvested morcellized autologous bone graft and DBM, 3. Anterior cervical plating C2-3 utilizing a ATEC plate 4.  Right carpal tunnel release 5.  Right ulnar nerve release at the elbow  SURGEON:  Sherley Bounds, MD  ASSISTANTS: Glenford Peers, FNP  ANESTHESIA:   General  EBL: 50 ml  Total I/O In: 1000 [I.V.:1000] Out: 50 [Blood:50]  BLOOD ADMINISTERED: none  DRAINS: none  SPECIMEN:  none  INDICATION FOR PROCEDURE: This patient presented with difficulty with gait and numbness and tingling in his hands with atrophy in his hands. Imaging showed significant adjacent level stenosis C2-3.  Conduction studies showed bilateral ulnar neuropathy and median neuropathy the patient tried conservative measures without relief. Pain was debilitating. Recommended ACDF with plating. Patient understood the risks, benefits, and alternatives and potential outcomes and wished to proceed.  PROCEDURE DETAILS: Patient was brought to the operating room placed under general endotracheal anesthesia. Patient was placed in the supine position on the operating room table.    We started with the right carpal tunnel release.  Right arm was prepped from the fingertips to the axilla with DuraPrep and then draped in the usual sterile fashion.  A small palmar incision was made in the palm in line with the webspace between the third and fourth digits.  We dissected down through the palmar fascia to identify the transverse carpal ligament was opened with a 15 blade scalpel.  The underlying median nerve was identified.  We then  spread between the nerve and the ligament with a hemostat and completely transected the ligament proximally and distally till the nerve was free.  We then palpated with a mosquito to make sure that the nerve was free.  We irrigated with saline solution.  Dried all bleeding points.  We then closed the palmar fascia with a single 3-0 Vicryl.  We closed the subcutaneous tissues with 3-0 Vicryl and the skin with interrupted 4-0 Ethilon vertical mattress sutures.  Then turned our attention to the right ulnar nerve decompression at the elbow.  A curvilinear incision was made over the medial epicondyle of the right elbow and carried down through the soft tissues.  We then dissected over to the cubital tunnel to identify the underlying ulnar nerve.  There was significant thickened tissue between the medial epicondyle and the radius.  This was opened and the nerve was freed.  We freed the nerve both proximally and distally until the nerve was well decompressed.  We then flexed the elbow.  The nerve did not sublux.  Irrigated with saline solution.  We dried all bleeding points.  We closed the subcutaneous tissues with 3-0 Vicryl and the subcuticular tissue with 3-0 Vicryl.  The skin was closed with benzoin and Steri-Strips.  We then wrapped the hand and the elbow with a Kerlix and an Ace bandage.  The drapes were removed and we turned our attention to the ACDF at C2-3.  At the end of these procedures the sponge needle and instrument counts were correct  The patient is then repositioned for ACDF.  The neck was prepped with Duraprep and draped in a sterile fashion.   Three cc of local anesthesia  was injected and a transverse incision was made on the right side of the neck.  Dissection was carried down thru the subcutaneous tissue and the platysma was  elevated, opened, and undermined with Metzenbaum scissors.  Dissection was then carried out thru an avascular plane leaving the sternocleidomastoid carotid artery and  jugular vein laterally and the trachea and esophagus medially with the assistance of my nurse practitioner. The ventral aspect of the vertebral column was identified and a localizing x-ray was taken. The C2-3 level was identified and all in the room agreed with the level. The longus colli muscles were then elevated and the retractor was placed with the assistance of my nurse practitioner. The annulus was incised and the disc space entered. Discectomy was performed with micro-curettes and pituitary rongeurs. I then used the high-speed drill to drill the endplates down to the level of the posterior longitudinal ligament. The drill shavings were saved in a mucous trap for later arthrodesis. The operating microscope was draped and brought into the field provided additional magnification, illumination and visualization. Discectomy was continued posteriorly thru the disc space. Posterior longitudinal ligament was opened with a nerve hook, and then removed along with disc herniation and osteophytes, decompressing the spinal canal and thecal sac. We then continued to remove osteophytic overgrowth and disc material decompressing the neural foramina and exiting nerve roots bilaterally. The scope was angled up and down to help decompress and undercut the vertebral bodies. Once the decompression was completed we could pass a nerve hook circumferentially to assure adequate decompression in the midline and in the neural foramina. So by both visualization and palpation we felt we had an adequate decompression of the neural elements. We then measured the height of the intravertebral disc space and selected a 9 millimeter PTI interbody cage packed with autograft and DBM. It was then gently positioned in the intravertebral disc space(s) and countersunk. I then used a 24 mm ATEC plate and placed 16 mm variable angle screws into the vertebral bodies of each level and locked them into position. The wound was irrigated with bacitracin  solution, checked for hemostasis which was established and confirmed. Once meticulous hemostasis was achieved, we then proceeded with closure with the assistance of my nurse practitioner. The platysma was closed with interrupted 3-0 undyed Vicryl suture, the subcuticular layer was closed with interrupted 3-0 undyed Vicryl suture. The skin edges were approximated with steristrips. The drapes were removed. A sterile dressing was applied. The patient was then awakened from general anesthesia and transferred to the recovery room in stable condition. At the end of the procedure all sponge, needle and instrument counts were correct.   PLAN OF CARE: Admit for overnight observation  PATIENT DISPOSITION:  PACU - hemodynamically stable.   Delay start of Pharmacological VTE agent (>24hrs) due to surgical blood loss or risk of bleeding:  yes

## 2022-08-14 NOTE — Anesthesia Procedure Notes (Signed)
Procedure Name: Intubation Date/Time: 08/14/2022 2:27 PM  Performed by: Minerva Ends, CRNAPre-anesthesia Checklist: Patient identified, Emergency Drugs available, Suction available and Patient being monitored Patient Re-evaluated:Patient Re-evaluated prior to induction Oxygen Delivery Method: Circle system utilized Preoxygenation: Pre-oxygenation with 100% oxygen Induction Type: IV induction Ventilation: Mask ventilation without difficulty Laryngoscope Size: Glidescope and 4 Grade View: Grade I Tube type: Oral Tube size: 7.5 mm Number of attempts: 1 Airway Equipment and Method: Stylet and Oral airway Placement Confirmation: ETT inserted through vocal cords under direct vision, positive ETCO2 and breath sounds checked- equal and bilateral Secured at: 21 cm Tube secured with: Tape Dental Injury: Teeth and Oropharynx as per pre-operative assessment

## 2022-08-15 DIAGNOSIS — M4712 Other spondylosis with myelopathy, cervical region: Secondary | ICD-10-CM | POA: Diagnosis not present

## 2022-08-15 DIAGNOSIS — G5621 Lesion of ulnar nerve, right upper limb: Secondary | ICD-10-CM | POA: Diagnosis not present

## 2022-08-15 DIAGNOSIS — G5601 Carpal tunnel syndrome, right upper limb: Secondary | ICD-10-CM | POA: Diagnosis not present

## 2022-08-15 DIAGNOSIS — M4802 Spinal stenosis, cervical region: Secondary | ICD-10-CM | POA: Diagnosis not present

## 2022-08-15 MED ORDER — OXYCODONE-ACETAMINOPHEN 5-325 MG PO TABS: 1.0000 | ORAL_TABLET | ORAL | 0 refills | Status: AC | PRN

## 2022-08-15 MED ORDER — TIZANIDINE HCL 2 MG PO TABS: 2.0000 mg | ORAL_TABLET | Freq: Four times a day (QID) | ORAL | 2 refills | Status: DC | PRN

## 2022-08-15 NOTE — Anesthesia Postprocedure Evaluation (Signed)
Anesthesia Post Note  Patient: Matthew Tucker  Procedure(s) Performed: ACDF - C2-C3 Right ulnar release (Right) Right carpal tunnel release (Right)     Patient location during evaluation: PACU Anesthesia Type: General Level of consciousness: sedated and patient cooperative Pain management: pain level controlled Vital Signs Assessment: post-procedure vital signs reviewed and stable Respiratory status: spontaneous breathing Cardiovascular status: stable Anesthetic complications: no   No notable events documented.  Last Vitals:  Vitals:   08/15/22 0534 08/15/22 0800  BP: 107/68 (!) 157/76  Pulse: (!) 59 (!) 54  Resp: 16 17  Temp: 36.8 C 37.1 C  SpO2: 98% 100%    Last Pain:  Vitals:   08/15/22 0800  TempSrc: Oral  PainSc:                  Nolon Nations

## 2022-08-15 NOTE — Evaluation (Signed)
Occupational Therapy Evaluation Patient Details Name: Matthew Tucker MRN: [id number] DOB: 12/30/1950 Today's Date: 08/15/2022   History of Present Illness Pt is a 71 y/o male admitted for ACDF C2-3, R carpal tunnel release and R ulnar nerve release on 12/4. VHQ:IONGEXB back pain, cervical stenosis, HLD, hx of C3-7 fusion, lumbar laminectomy.   Clinical Impression   PTA, pt lives with spouse in a two level home, typically Independent in all ADLs/mobility without AD. Pt presents now with minor deficits in dynamic standing balance. Educated re: cervical precautions, no need for brace, conservative precautions for RUE, and fall prevention in the home. Overall, pt able to complete UB/LB ADLs in standing with Supervision though requires min guard for mobility due to balance deficits. Pt currently reliant on IV pole for support during mobility and pt aware of these deficits. Educated on trial of cane w/ PT as appropriate to determine optimal AD for use at home. Anticipate no immediate OT needs at DC though may benefit from OP therapies in the future for R UE pending neurosurgeon's recommendations. Encouraged pt to have assist with stair mgmt and supervision for initial showering task attempts to maximize safety. Will follow acutely if pt to remain admitted.       Recommendations for follow up therapy are one component of a multi-disciplinary discharge planning process, led by the attending physician.  Recommendations may be updated based on patient status, additional functional criteria and insurance authorization.   Follow Up Recommendations  Follow physician's recommendations for discharge plan and follow up therapies (no immediate OT needs; will likely benefit from OP therapy follow up for RUE when strengthening/progressive ROM allowed)     Assistance Recommended at Discharge PRN  Patient can return home with the following Help with stairs or ramp for entrance;Assist for transportation;Assistance  with cooking/housework    Functional Status Assessment  Patient has had a recent decline in their functional status and demonstrates the ability to make significant improvements in function in a reasonable and predictable amount of time.  Equipment Recommendations  None recommended by OT    Recommendations for Other Services       Precautions / Restrictions Precautions Precautions: Fall;Cervical Precaution Booklet Issued: Yes (comment) Precaution Comments: no brace needed per orders Restrictions Weight Bearing Restrictions: No Other Position/Activity Restrictions: no formal WB restrictions. educated to avoid R UE WB conservatively      Mobility Bed Mobility Overal bed mobility: Modified Independent                  Transfers Overall transfer level: Modified independent Equipment used: None               General transfer comment: able to stand without assist or device but reaching for IV pole when initiating mobility      Balance Overall balance assessment: Needs assistance Sitting-balance support: No upper extremity supported, Feet supported Sitting balance-Leahy Scale: Normal     Standing balance support: No upper extremity supported, During functional activity, Single extremity supported Standing balance-Leahy Scale: Fair Standing balance comment: able to statically stand, benefits from LUE support on IV pole for mobility                           ADL either performed or assessed with clinical judgement   ADL Overall ADL's : Needs assistance/impaired Eating/Feeding: Independent   Grooming: Supervision/safety;Standing   Upper Body Bathing: Supervision/ safety;Standing   Lower Body Bathing: Supervison/ safety;Sit to/from stand  Upper Body Dressing : Modified independent   Lower Body Dressing: Supervision/safety Lower Body Dressing Details (indicate cue type and reason): able to don boxers w/o assist, using R hand to assist during  task Toilet Transfer: Min guard;Ambulation   Toileting- Clothing Manipulation and Hygiene: Supervision/safety;Sitting/lateral lean;Sit to/from stand       Functional mobility during ADLs: Min guard General ADL Comments: Pt able to manage dressing tasks despite R UE bandages/limitations. Pt with dynamic balance deficits, reliant on IV pole for support for in room mobility and slower pace noted. Pt aware of deficits and reported "i feel like i would not be able to walk well without this pole". Discussed trial of cane to prevent excessive strain on RUE w/ RW -     Vision Baseline Vision/History: 1 Wears glasses (reading) Ability to See in Adequate Light: 0 Adequate Patient Visual Report: No change from baseline Vision Assessment?: No apparent visual deficits     Perception     Praxis      Pertinent Vitals/Pain Pain Assessment Pain Assessment: No/denies pain     Hand Dominance Right   Extremity/Trunk Assessment Upper Extremity Assessment Upper Extremity Assessment: RUE deficits/detail RUE Deficits / Details: s/p carpal tunnel and ulnar nerve release. large ace bandage around hand/wrist, able to flex/extend digits easily and hold items. ace wrap around elbow limiting elbow flex/ext abilities but functional during tasks. encouraged gentle AROM within tolerance   Lower Extremity Assessment Lower Extremity Assessment: Defer to PT evaluation   Cervical / Trunk Assessment Cervical / Trunk Assessment: Neck Surgery   Communication Communication Communication: No difficulties   Cognition Arousal/Alertness: Awake/alert Behavior During Therapy: WFL for tasks assessed/performed Overall Cognitive Status: Within Functional Limits for tasks assessed                                 General Comments: good awareness of deficits     General Comments       Exercises     Shoulder Instructions      Home Living Family/patient expects to be discharged to:: Private  residence Living Arrangements: Spouse/significant other Available Help at Discharge: Family;Available 24 hours/day Type of Home: House Home Access: Stairs to enter [employer] of Steps: 4-5 Entrance Stairs-Rails: Right;Left Home Layout: Two level Alternate Level Stairs-Number of Steps: 14 Alternate Level Stairs-Rails: Right;Left Bathroom Shower/Tub: Teacher, early years/pre: Standard     Home Equipment: Conservation officer, nature (2 wheels);Cane - single point;Grab bars - tub/shower          Prior Functioning/Environment Prior Level of Function : Independent/Modified Independent;Driving                        OT Problem List: Impaired balance (sitting and/or standing);Impaired UE functional use      OT Treatment/Interventions: Self-care/ADL training;Therapeutic exercise;Energy conservation;DME and/or AE instruction;Therapeutic activities    OT Goals(Current goals can be found in the care plan section) Acute Rehab OT Goals Patient Stated Goal: go home later today vs tomorrow morning OT Goal Formulation: With patient Time For Goal Achievement: 08/29/22 Potential to Achieve Goals: Good ADL Goals Pt Will Perform Grooming: with modified independence;standing Pt Will Transfer to Toilet: with modified independence;ambulating Pt Will Perform Toileting - Clothing Manipulation and hygiene: with modified independence;sit to/from stand;sitting/lateral leans  OT Frequency: Min 2X/week    Co-evaluation              AM-PAC OT "  6 Clicks" Daily Activity     Outcome Measure Help from another person eating meals?: None Help from another person taking care of personal grooming?: A Little Help from another person toileting, which includes using toliet, bedpan, or urinal?: A Little Help from another person bathing (including washing, rinsing, drying)?: A Little Help from another person to put on and taking off regular upper body clothing?: None Help from another  person to put on and taking off regular lower body clothing?: A Little 6 Click Score: 20   End of Session Nurse Communication: Mobility status  Activity Tolerance: Patient tolerated treatment well Patient left: in bed;with call bell/phone within reach (EOB eating breakfast)  OT Visit Diagnosis: Unsteadiness on feet (R26.81)                Time: 3154-0086 OT Time Calculation (min): 27 min Charges:  OT General Charges $OT Visit: 1 Visit OT Evaluation $OT Eval Low Complexity: 1 Low OT Treatments $Self Care/Home Management : 8-22 mins  Malachy Chamber, OTR/L Acute Rehab Services Office: [phone]   Layla Maw 08/15/2022, 9:34 AM

## 2022-08-15 NOTE — Evaluation (Signed)
Physical Therapy Evaluation Patient Details Name: Matthew Tucker MRN: [id number] DOB: 1950-10-31 Today's Date: 08/15/2022  History of Present Illness  Pt is a 72 y/o male admitted for ACDF C2-3, R carpal tunnel release and R ulnar nerve release on 12/4. BVQ:[id number] back pain, cervical stenosis, HLD, hx of C3-7 fusion, lumbar laminectomy.  Clinical Impression  Pt admitted with above diagnosis. Pt normally independent.  He has home support and cane at home.  Today, pt ambulating 200' and performed stairs similar to home setup.  He had some mild instability that improved with use of cane.    Pt demonstrates safe gait & transfers in order to return home from PT perspective once discharged by MD.  While in hospital, will continue to benefit from PT for skilled therapy to advance mobility and exercises.    Pt currently with functional limitations due to the deficits listed below (see PT Problem List). Pt will benefit from skilled PT to increase their independence and safety with mobility to allow discharge to the venue listed below.          Recommendations for follow up therapy are one component of a multi-disciplinary discharge planning process, led by the attending physician.  Recommendations may be updated based on patient status, additional functional criteria and insurance authorization.  Follow Up Recommendations No PT follow up      Assistance Recommended at Discharge PRN  Patient can return home with the following  Assistance with cooking/housework;Help with stairs or ramp for entrance    Equipment Recommendations None recommended by PT  Recommendations for Other Services       Functional Status Assessment Patient has had a recent decline in their functional status and demonstrates the ability to make significant improvements in function in a reasonable and predictable amount of time.     Precautions / Restrictions Precautions Precautions: Fall;Cervical Precaution Booklet Issued:  Yes (comment) Precaution Comments: no brace needed per orders Restrictions Weight Bearing Restrictions: No Other Position/Activity Restrictions: no formal WB restrictions. educated to avoid R UE WB conservatively      Mobility  Bed Mobility Overal bed mobility: Modified Independent             General bed mobility comments: educated on transfer technique with cervical precautions    Transfers Overall transfer level: Modified independent Equipment used: Straight cane               General transfer comment: Demonstrated safely with cane    Ambulation/Gait Ambulation/Gait assistance: Min guard, Supervision Gait Distance (Feet): 200 Feet Assistive device: Straight cane Gait Pattern/deviations: Step-through pattern Gait velocity: decreased     General Gait Details: Pt had episode of scissor gait initially and recovered without assist; good use of cane; started min guard progressed to supervision  Stairs Stairs: Yes Stairs assistance: Min guard Stair Management: One rail Left, Forwards, Alternating pattern Number of Stairs: 4 General stair comments: min guard safety but no assist needed  Wheelchair Mobility    Modified Rankin (Stroke Patients Only)       Balance Overall balance assessment: Needs assistance Sitting-balance support: No upper extremity supported, Feet supported Sitting balance-Leahy Scale: Normal     Standing balance support: No upper extremity supported, During functional activity, Single extremity supported Standing balance-Leahy Scale: Good Standing balance comment: Could weight shift and transfer without AD; mild instability with gait improved with cane  Pertinent Vitals/Pain Pain Assessment Pain Assessment: No/denies pain    Home Living Family/patient expects to be discharged to:: Private residence Living Arrangements: Spouse/significant other Available Help at Discharge: Family;Available 24  hours/day Type of Home: House Home Access: Stairs to enter Entrance Stairs-Rails: Right;Left Entrance Stairs-Number of Steps: 4-5 Alternate Level Stairs-Number of Steps: 14 Home Layout: Two level Home Equipment: Conservation officer, nature (2 wheels);Cane - single point;Grab bars - tub/shower      Prior Function Prior Level of Function : Independent/Modified Independent;Driving             Mobility Comments: Ambulates outside and in community without AD; denies recent falls       Hand Dominance   Dominant Hand: Right    Extremity/Trunk Assessment   Upper Extremity Assessment Upper Extremity Assessment: Defer to OT evaluation RUE Deficits / Details: s/p carpal tunnel and ulnar nerve release. large ace bandage around hand/wrist, able to flex/extend digits easily and hold items. ace wrap around elbow limiting elbow flex/ext abilities but functional during tasks. encouraged gentle AROM within tolerance    Lower Extremity Assessment Lower Extremity Assessment: Overall WFL for tasks assessed    Cervical / Trunk Assessment Cervical / Trunk Assessment: Neck Surgery  Communication   Communication: No difficulties  Cognition Arousal/Alertness: Awake/alert Behavior During Therapy: WFL for tasks assessed/performed Overall Cognitive Status: Within Functional Limits for tasks assessed                                 General Comments: good awareness of deficits and precautions        General Comments General comments (skin integrity, edema, etc.): vss; educated on cervical precautions, safety (slow transitions, assist with stairs, step to pattern on stairs, use of cane, taking time with walking with focus to increase base of support)    Exercises     Assessment/Plan    PT Assessment Patient needs continued PT services  PT Problem List Decreased coordination;Decreased balance;Decreased range of motion;Decreased mobility       PT Treatment Interventions DME  instruction;Therapeutic exercise;Gait training;Balance training;Stair training;Neuromuscular re-education;Functional mobility training;Therapeutic activities;Patient/family education    PT Goals (Current goals can be found in the Care Plan section)  Acute Rehab PT Goals Patient Stated Goal: return home PT Goal Formulation: With patient/family Time For Goal Achievement: 08/29/22 Potential to Achieve Goals: Good Additional Goals Additional Goal #1: Will score >19 on DGI to indicate lower fall risk    Frequency Min 3X/week     Co-evaluation               AM-PAC PT "6 Clicks" Mobility  Outcome Measure Help needed turning from your back to your side while in a flat bed without using bedrails?: None Help needed moving from lying on your back to sitting on the side of a flat bed without using bedrails?: None Help needed moving to and from a bed to a chair (including a wheelchair)?: None Help needed standing up from a chair using your arms (e.g., wheelchair or bedside chair)?: None Help needed to walk in hospital room?: A Little Help needed climbing 3-5 steps with a railing? : A Little 6 Click Score: 22    End of Session Equipment Utilized During Treatment: Gait belt Activity Tolerance: Patient tolerated treatment well Patient left: in bed;with call bell/phone within reach;with family/visitor present Nurse Communication: Mobility status;Other (comment) (cleared PT) PT Visit Diagnosis: Other abnormalities of gait and mobility (R26.89)  Time: 1017-1040 PT Time Calculation (min) (ACUTE ONLY): 23 min   Charges:   PT Evaluation $PT Eval Low Complexity: 1 Low PT Treatments $Gait Training: 8-22 mins        Abran Richard, PT Acute Rehab [facility] [place] 08/15/2022, 10:49 AM

## 2022-08-15 NOTE — Discharge Summary (Signed)
Physician Discharge Summary  Patient ID: Matthew Tucker MRN: [id number] DOB/AGE: 1951-07-13 71 y.o.  Admit date: 08/14/2022 Discharge date: 08/15/2022  Admission Diagnoses: cervical stenosis, R CTS, R ulnar neuropathy   Discharge Diagnoses: same   Discharged Condition: good  Hospital Course: The patient was admitted on 08/14/2022 and taken to the operating room where the patient underwent ACDF C2-3, R CTR, R ulnar nerve release. The patient tolerated the procedure well and was taken to the recovery room and then to the floor in stable condition. The hospital course was routine. There were no complications. The wound remained clean dry and intact. Pt had appropriate neck soreness. No complaints of arm pain or new N/T/W. The patient remained afebrile with stable vital signs, and tolerated a regular diet. The patient continued to increase activities, and pain was well controlled with oral pain medications.   Consults: None  Significant Diagnostic Studies:  Results for orders placed or performed during the hospital encounter of 08/10/22  Surgical pcr screen   Specimen: Nasal Mucosa; Nasal Swab  Result Value Ref Range   MRSA, PCR NEGATIVE NEGATIVE   Staphylococcus aureus NEGATIVE NEGATIVE  Protime-INR  Result Value Ref Range   Prothrombin Time 13.4 11.4 - 15.2 seconds   INR 1.0 0.8 - 1.2  Comprehensive metabolic panel per protocol  Result Value Ref Range   Sodium 140 135 - 145 mmol/L   Potassium 3.4 (L) 3.5 - 5.1 mmol/L   Chloride 104 98 - 111 mmol/L   CO2 26 22 - 32 mmol/L   Glucose, Bld 96 70 - 99 mg/dL   BUN 13 8 - 23 mg/dL   Creatinine, Ser 1.22 0.61 - 1.24 mg/dL   Calcium 8.9 8.9 - 10.3 mg/dL   Total Protein 6.5 6.5 - 8.1 g/dL   Albumin 3.6 3.5 - 5.0 g/dL   AST 23 15 - 41 U/L   ALT 16 0 - 44 U/L   Alkaline Phosphatase 64 38 - 126 U/L   Total Bilirubin 0.7 0.3 - 1.2 mg/dL   GFR, Estimated >60 >60 mL/min   Anion gap 10 5 - 15  CBC per protocol  Result Value Ref Range    WBC 6.1 4.0 - 10.5 K/uL   RBC 4.48 4.22 - 5.81 MIL/uL   Hemoglobin 13.7 13.0 - 17.0 g/dL   HCT 41.5 39.0 - 52.0 %   MCV 92.6 80.0 - 100.0 fL   MCH 30.6 26.0 - 34.0 pg   MCHC 33.0 30.0 - 36.0 g/dL   RDW 11.8 11.5 - 15.5 %   Platelets 159 150 - 400 K/uL   nRBC 0.0 0.0 - 0.2 %    DG Cervical Spine 1 View  Result Date: 08/14/2022 CLINICAL DATA:  Elective surgery. C2-C3 ACDF EXAM: DG CERVICAL SPINE - 1 VIEW COMPARISON:  Preoperative MRI 07/11/2022 FINDINGS: Single lateral fluoroscopic spot view of the cervical spine obtained in the operating room. Anterior fusion hardware with interbody spacer at C2-C3, new from prior. Previous posterior hardware is in place. Fluoroscopy time 7 seconds. Dose 0.99 mGy. IMPRESSION: Intraoperative fluoroscopic spot view for cervical spine surgery. Electronically Signed   By: Keith Rake M.D.   On: 08/14/2022 18:26   DG C-Arm 1-60 Min-No Report  Result Date: 08/14/2022 Fluoroscopy was utilized by the requesting physician.  No radiographic interpretation.   DG C-Arm 1-60 Min-No Report  Result Date: 08/14/2022 Fluoroscopy was utilized by the requesting physician.  No radiographic interpretation.    Antibiotics:  Anti-infectives (From admission, onward)  Start     Dose/Rate Route Frequency Ordered Stop   08/14/22 2000  ceFAZolin (ANCEF) IVPB 2g/100 mL premix        2 g 200 mL/hr over 30 Minutes Intravenous Every 8 hours 08/14/22 1958 08/15/22 0558   08/14/22 1136  ceFAZolin (ANCEF) 2-4 GM/100ML-% IVPB       Note to Pharmacy: Leonides Sake: cabinet override      08/14/22 1136 08/14/22 2344   08/14/22 1130  ceFAZolin (ANCEF) IVPB 2g/100 mL premix        2 g 200 mL/hr over 30 Minutes Intravenous On call to O.R. 08/14/22 1125 08/14/22 1429   08/14/22 1130  ceFAZolin (ANCEF) IVPB 2g/100 mL premix  Status:  Discontinued        2 g 200 mL/hr over 30 Minutes Intravenous On call to O.R. 08/14/22 1126 08/14/22 1141       Discharge Exam: Blood pressure  107/68, pulse (!) 59, temperature 98.3 F (36.8 C), temperature source Oral, resp. rate 16, height '5\' 11"'$  (1.803 m), weight 88.9 kg, SpO2 98 %. Neurologic: Grossly normal Dressing dry  Discharge Medications:   Allergies as of 08/15/2022       Reactions   Other Other (See Comments)   Nivaquine. Found in [place] and [place] for Malaria treatment UNSPECIFIED REACTION    Levofloxacin Other (See Comments)   Irritates gums        Medication List     STOP taking these medications    cyclobenzaprine 5 MG tablet Commonly known as: FLEXERIL Replaced by: tiZANidine 2 MG tablet       TAKE these medications    aspirin EC 81 MG tablet Take 81 mg by mouth daily.   furosemide 20 MG tablet Commonly known as: LASIX TAKE 1 TABLET(20 MG) BY MOUTH DAILY AS NEEDED   ibuprofen 600 MG tablet Commonly known as: ADVIL TAKE 1 TABLET(600 MG) BY MOUTH EVERY 8 HOURS AS NEEDED   oxyCODONE-acetaminophen 5-325 MG tablet Commonly known as: Percocet Take 1 tablet by mouth every 4 (four) hours as needed for severe pain.   potassium chloride 10 MEQ tablet Commonly known as: KLOR-CON TAKE 2 TABLETS(20 MEQ) BY MOUTH DAILY as needed with fluid pill   rosuvastatin 20 MG tablet Commonly known as: CRESTOR TAKE 1 TABLET(20 MG) BY MOUTH DAILY   sildenafil 100 MG tablet Commonly known as: Viagra Take 0.5-1 tablets (50-100 mg total) by mouth daily as needed for erectile dysfunction.   tiZANidine 2 MG tablet Commonly known as: ZANAFLEX Take 1 tablet (2 mg total) by mouth every 6 (six) hours as needed for muscle spasms. Replaces: cyclobenzaprine 5 MG tablet        Disposition: home   Final Dx: ACDF C2-3, R CTR, R ulnar neuropathy   Discharge Instructions     Call MD for:  difficulty breathing, headache or visual disturbances   Complete by: As directed    Call MD for:  hives   Complete by: As directed    Call MD for:  persistant nausea and vomiting   Complete by: As directed    Call MD  for:  redness, tenderness, or signs of infection (pain, swelling, redness, odor or green/yellow discharge around incision site)   Complete by: As directed    Call MD for:  severe uncontrolled pain   Complete by: As directed    Call MD for:  temperature >100.4   Complete by: As directed    Diet - low sodium heart healthy   Complete  by: As directed    Driving Restrictions   Complete by: As directed    No driving for 2 weeks, no riding in the car for 1 week   Increase activity slowly   Complete by: As directed    Lifting restrictions   Complete by: As directed    No lifting more than 8 lbs   No wound care   Complete by: As directed           Signed: Eustace Moore 08/15/2022, 7:57 AM

## 2022-08-16 ENCOUNTER — Encounter (HOSPITAL_COMMUNITY): Payer: Self-pay | Admitting: Neurological Surgery

## 2022-08-17 ENCOUNTER — Encounter (HOSPITAL_COMMUNITY): Payer: Self-pay | Admitting: Neurological Surgery

## 2022-09-24 ENCOUNTER — Other Ambulatory Visit: Payer: Self-pay | Admitting: Internal Medicine

## 2022-09-24 NOTE — Telephone Encounter (Signed)
Please refill as per office routine med refill policy (all routine meds to be refilled for 3 mo or monthly (per pt preference) up to one year from last visit, then month to month grace period for 3 mo, then further med refills will have to be denied)

## 2022-10-03 DIAGNOSIS — M4802 Spinal stenosis, cervical region: Secondary | ICD-10-CM | POA: Diagnosis not present

## 2022-12-26 ENCOUNTER — Ambulatory Visit (INDEPENDENT_AMBULATORY_CARE_PROVIDER_SITE_OTHER): Payer: Medicare Other | Admitting: Internal Medicine

## 2022-12-26 ENCOUNTER — Encounter: Payer: Self-pay | Admitting: Internal Medicine

## 2022-12-26 VITALS — BP 120/76 | HR 68 | Temp 98.6°F | Ht 71.0 in | Wt 210.0 lb

## 2022-12-26 DIAGNOSIS — I5030 Unspecified diastolic (congestive) heart failure: Secondary | ICD-10-CM | POA: Diagnosis not present

## 2022-12-26 DIAGNOSIS — E559 Vitamin D deficiency, unspecified: Secondary | ICD-10-CM | POA: Diagnosis not present

## 2022-12-26 DIAGNOSIS — E538 Deficiency of other specified B group vitamins: Secondary | ICD-10-CM

## 2022-12-26 DIAGNOSIS — E7800 Pure hypercholesterolemia, unspecified: Secondary | ICD-10-CM | POA: Diagnosis not present

## 2022-12-26 DIAGNOSIS — R7302 Impaired glucose tolerance (oral): Secondary | ICD-10-CM

## 2022-12-26 DIAGNOSIS — N320 Bladder-neck obstruction: Secondary | ICD-10-CM

## 2022-12-26 LAB — HEPATIC FUNCTION PANEL
ALT: 14 U/L (ref 0–53)
AST: 24 U/L (ref 0–37)
Albumin: 4.2 g/dL (ref 3.5–5.2)
Alkaline Phosphatase: 78 U/L (ref 39–117)
Bilirubin, Direct: 0.2 mg/dL (ref 0.0–0.3)
Total Bilirubin: 1.2 mg/dL (ref 0.2–1.2)
Total Protein: 6.9 g/dL (ref 6.0–8.3)

## 2022-12-26 LAB — CBC WITH DIFFERENTIAL/PLATELET
Basophils Absolute: 0 10*3/uL (ref 0.0–0.1)
Basophils Relative: 0.6 % (ref 0.0–3.0)
Eosinophils Absolute: 0.3 10*3/uL (ref 0.0–0.7)
Eosinophils Relative: 4.3 % (ref 0.0–5.0)
HCT: 42.1 % (ref 39.0–52.0)
Hemoglobin: 14 g/dL (ref 13.0–17.0)
Lymphocytes Relative: 42.5 % (ref 12.0–46.0)
Lymphs Abs: 2.9 10*3/uL (ref 0.7–4.0)
MCHC: 33.2 g/dL (ref 30.0–36.0)
MCV: 90.8 fl (ref 78.0–100.0)
Monocytes Absolute: 0.7 10*3/uL (ref 0.1–1.0)
Monocytes Relative: 10.4 % (ref 3.0–12.0)
Neutro Abs: 2.8 10*3/uL (ref 1.4–7.7)
Neutrophils Relative %: 42.2 % — ABNORMAL LOW (ref 43.0–77.0)
Platelets: 172 10*3/uL (ref 150.0–400.0)
RBC: 4.64 Mil/uL (ref 4.22–5.81)
RDW: 12.7 % (ref 11.5–15.5)
WBC: 6.7 10*3/uL (ref 4.0–10.5)

## 2022-12-26 LAB — BASIC METABOLIC PANEL
BUN: 13 mg/dL (ref 6–23)
CO2: 31 mEq/L (ref 19–32)
Calcium: 9.2 mg/dL (ref 8.4–10.5)
Chloride: 101 mEq/L (ref 96–112)
Creatinine, Ser: 1.16 mg/dL (ref 0.40–1.50)
GFR: 62.37 mL/min (ref >60.00)
Glucose, Bld: 75 mg/dL (ref 70–99)
Potassium: 3.5 mEq/L (ref 3.5–5.1)
Sodium: 141 mEq/L (ref 135–145)

## 2022-12-26 LAB — LIPID PANEL
Cholesterol: 163 mg/dL (ref 0–200)
HDL: 70.4 mg/dL (ref >39.00)
LDL Cholesterol: 81 mg/dL (ref 0–99)
NonHDL: 92.23
Total CHOL/HDL Ratio: 2
Triglycerides: 57 mg/dL (ref 0.0–149.0)
VLDL: 11.4 mg/dL (ref 0.0–40.0)

## 2022-12-26 LAB — TSH: TSH: 3.61 u[IU]/mL (ref 0.35–5.50)

## 2022-12-26 LAB — PSA: PSA: 2.38 ng/mL (ref 0.10–4.00)

## 2022-12-26 LAB — HEMOGLOBIN A1C: Hgb A1c MFr Bld: 5.4 % (ref 4.6–6.5)

## 2022-12-26 LAB — VITAMIN B12: Vitamin B-12: 467 pg/mL (ref 211–911)

## 2022-12-26 LAB — VITAMIN D 25 HYDROXY (VIT D DEFICIENCY, FRACTURES): VITD: 22.14 ng/mL — ABNORMAL LOW (ref 30.00–100.00)

## 2022-12-26 MED ORDER — FUROSEMIDE 40 MG PO TABS: 40.0000 mg | ORAL_TABLET | Freq: Every day | ORAL | 3 refills | Status: DC

## 2022-12-26 NOTE — Patient Instructions (Addendum)
Please have your Shingrix (shingles) shots done at your local pharmacy.  Ok to incresae the lasix to 40 mg once in the AM  Please check your weight daily in the AM and bring the results with you at the next visit   Please continue all other medications as before, and refills have been done if requested.  Please have the pharmacy call with any other refills you may need.  Please continue your efforts at being more active, low cholesterol diet, and weight control.  Please keep your appointments with your specialists as you may have planned  You will be contacted regarding the referral for: echocardiogram  Please go to the LAB at the blood drawing area for the tests to be done  You will be contacted by phone if any changes need to be made immediately.  Otherwise, you will receive a letter about your results with an explanation, but please check with MyChart first.  Please make an Appointment to return in 2 weeks, or sooner if needed

## 2022-12-26 NOTE — Progress Notes (Unsigned)
Patient ID: Matthew Tucker, male   DOB: 1950/12/30, 73 y.o.   MRN: [id number]        Chief Complaint: follow up worseing leg sweling and wt gain, hld, hyperglycemia, low vit d       HPI:  Matthew Tucker is a 72 y.o. male here with c/o 1 -2 wks increase leg swelling and wt gain, though Pt denies chest pain, increased sob or doe, wheezing, orthopnea, PND,, palpitations, dizziness or syncope.   Pt denies polydipsia, polyuria, or new focal neuro s/s.    Pt denies fever, wt loss, night sweats, loss of appetite, or other constitutional symptoms  Did try taking lasix 20 mg tid yesterday but did not seem to increase diuresis.  Due for shingrix at the pharmacy.    Wt Readings from Last 3 Encounters:  12/26/22 210 lb (95.3 kg)  08/14/22 196 lb (88.9 kg)  08/10/22 205 lb 3.2 oz (93.1 kg)   BP Readings from Last 3 Encounters:  12/26/22 120/76  08/15/22 (!) 157/76  08/10/22 130/89         Past Medical History:  Diagnosis Date   Arthritis    Neck, bilateral hands   BACK PAIN, CHRONIC 10/31/2010   Cervical stenosis of spine    Chronic lumbar radiculopathy 05/20/2015   Diastolic dysfunction 05/21/2018   Family history of colon cancer 01-30-2013   Father died at 70yo   HYPERLIPIDEMIA 15-Sep-2007   Impaired glucose tolerance 08/29/2011   PARESTHESIA 09-15-2007   Toxic effect of chlorine gas(987.6) 10/31/2010   Past Surgical History:  Procedure Laterality Date   ANTERIOR CERVICAL DECOMP/DISCECTOMY FUSION N/A 08/14/2022   Procedure: ACDF - C2-C3;  Surgeon: Tia Alert, MD;  Location: [facility] OR;  Service: Neurosurgery;  Laterality: N/A;   BIOPSY  10/13/2021   Procedure: BIOPSY;  Surgeon: Napoleon Form, MD;  Location: [facility];  Service: Endoscopy;;   BUNIONECTOMY     CARPAL TUNNEL RELEASE Right 08/14/2022   Procedure: Right carpal tunnel release;  Surgeon: Tia Alert, MD;  Location: [facility] OR;  Service: Neurosurgery;  Laterality: Right;   COLONOSCOPY     COLONOSCOPY WITH PROPOFOL N/A 10/13/2021    Procedure: COLONOSCOPY WITH PROPOFOL;  Surgeon: Napoleon Form, MD;  Location: [facility];  Service: Endoscopy;  Laterality: N/A;   FOOT SURGERY Right    LUMBAR LAMINECTOMY/DECOMPRESSION MICRODISCECTOMY N/A 06/20/2018   Procedure: Laminectomy and Foraminotomy - Lumbar one-Lumbar two - Lumbar two-Lumbar three - Lumbar three-Lumbar four - Lumbar four-Lumbar five;  Surgeon: Tia Alert, MD;  Location: [facility] OR;  Service: Neurosurgery;  Laterality: N/A;   mass removal     back, forehead; benign (lipoma)    mass removal  2011   head; benign   POLYPECTOMY  10/13/2021   Procedure: POLYPECTOMY;  Surgeon: Napoleon Form, MD;  Location: [facility];  Service: Endoscopy;;   POSTERIOR CERVICAL FUSION/FORAMINOTOMY N/A 01/03/2018   Procedure: Posterior Cervical Fusion with lateral mass fixation - Cervical three - Cervical seven, cervical laminectomy Cervical three-cervical seven;  Surgeon: Tia Alert, MD;  Location: [facility] OR;  Service: Neurosurgery;  Laterality: N/A;   ROOT CANAL     s/p lipoma right scalp posteriorly  2011   ULNAR NERVE TRANSPOSITION Right 08/14/2022   Procedure: Right ulnar release;  Surgeon: Tia Alert, MD;  Location: [facility] OR;  Service: Neurosurgery;  Laterality: Right;    reports that he has never smoked. He has never used smokeless tobacco. He reports current alcohol use. He reports  current drug use. Drug: Marijuana. family history includes Cancer in his mother; Colon cancer (age of onset: 6) in his father; Stroke (age of onset: 64) in his brother. Allergies  Allergen Reactions   Other Other (See Comments)    Nivaquine. Found in [place] and [place] for Malaria treatment  UNSPECIFIED REACTION    Levofloxacin Other (See Comments)    Irritates gums   Current Outpatient Medications on File Prior to Visit  Medication Sig Dispense Refill   aspirin EC 81 MG tablet Take 81 mg by mouth daily.     ibuprofen (ADVIL) 600 MG tablet TAKE 1 TABLET(600 MG) BY MOUTH EVERY 8 HOURS AS  NEEDED 60 tablet 2   oxyCODONE-acetaminophen (PERCOCET) 5-325 MG tablet Take 1 tablet by mouth every 4 (four) hours as needed for severe pain. 20 tablet 0   potassium chloride (KLOR-CON) 10 MEQ tablet TAKE 2 TABLETS(20 MEQ) BY MOUTH DAILY as needed with fluid pill 60 tablet 1   sildenafil (VIAGRA) 100 MG tablet Take 0.5-1 tablets (50-100 mg total) by mouth daily as needed for erectile dysfunction. 10 tablet 11   tiZANidine (ZANAFLEX) 2 MG tablet Take 1 tablet (2 mg total) by mouth every 6 (six) hours as needed for muscle spasms. 60 tablet 2   No current facility-administered medications on file prior to visit.        ROS:  All others reviewed and negative.  Objective        PE:  BP 120/76 (BP Location: Left Arm, Patient Position: Sitting, Cuff Size: Normal)   Pulse 68   Temp 98.6 F (37 C) (Oral)   Ht  (1.803 m)   Wt 210 lb (95.3 kg)   SpO2 99%   BMI 29.29 kg/m                 Constitutional: Pt appears in NAD               HENT: Head: NCAT.                Right Ear: External ear normal.                 Left Ear: External ear normal.                Eyes: . Pupils are equal, round, and reactive to light. Conjunctivae and EOM are normal               Nose: without d/c or deformity               Neck: Neck supple. Gross normal ROM               Cardiovascular: Normal rate and regular rhythm.                 Pulmonary/Chest: Effort normal and breath sounds without rales or wheezing.                Abd:  Soft, NT, ND, + BS, no organomegaly               Neurological: Pt is alert. At baseline orientation, motor grossly intact               Skin: Skin is warm. No rashes, no other new lesions, LE edema - 1-2+ bilat edema               Psychiatric: Pt behavior is normal without agitation   Micro: none  Cardiac tracings I  have personally interpreted today:  none  Pertinent Radiological findings (summarize): none   Lab Results  Component Value Date   WBC 6.7 12/26/2022   HGB  14.0 12/26/2022   HCT 42.1 12/26/2022   PLT 172.0 12/26/2022   GLUCOSE 75 12/26/2022   CHOL 163 12/26/2022   TRIG 57.0 12/26/2022   HDL 70.40 12/26/2022   LDLDIRECT 147.4 01/10/2013   LDLCALC 81 12/26/2022   ALT 14 12/26/2022   AST 24 12/26/2022   NA 141 12/26/2022   K 3.5 12/26/2022   CL 101 12/26/2022   CREATININE 1.16 12/26/2022   BUN 13 12/26/2022   CO2 31 12/26/2022   TSH 3.61 12/26/2022   PSA 2.38 12/26/2022   INR 1.0 08/10/2022   HGBA1C 5.4 12/26/2022   Assessment/Plan:  Matthew Tucker is a 72 y.o. Black or African American [2] male with  has a past medical history of Arthritis, BACK PAIN, CHRONIC (10/31/2010), Cervical stenosis of spine, Chronic lumbar radiculopathy (05/20/2015), Diastolic dysfunction (05/21/2018), Family history of colon cancer (01/17/2013), HYPERLIPIDEMIA (09/02/2007), Impaired glucose tolerance (08/29/2011), PARESTHESIA (09/02/2007), and Toxic effect of chlorine gas(987.6) (10/31/2010).  CHF (congestive heart failure) With recent worsening, for change lasix to 40 mg daily, daily wts, call in 1 wk if not improved for possible bid dosing, labs today includiing cbc and bmp, echocardiogram, and f/y here 2 wks  Hyperlipidemia Lab Results  Component Value Date   LDLCALC 81 12/26/2022   Uncontrolled, goal ldl < 70, pt to continue current statin crestor 40 mg qd, lower chol diet, diet, declines add zetia for now   Impaired glucose tolerance Lab Results  Component Value Date   HGBA1C 5.4 12/26/2022   Stable, pt to continue current medical treatment  - diet wt control  Vitamin D deficiency Last vitamin D Lab Results  Component Value Date   VD25OH 22.14 (L) 12/26/2022   Low, to start oral replacement  Followup: Return in about 2 weeks (around 01/09/2023).  Oliver Barre, MD 12/28/2022 8:44 PM [facility] - [facility]

## 2022-12-27 ENCOUNTER — Other Ambulatory Visit: Payer: Self-pay | Admitting: Internal Medicine

## 2022-12-27 LAB — URINALYSIS, ROUTINE W REFLEX MICROSCOPIC
Bilirubin Urine: NEGATIVE
Ketones, ur: NEGATIVE
Leukocytes,Ua: NEGATIVE
Nitrite: NEGATIVE
Specific Gravity, Urine: 1.02 (ref 1.000–1.030)
Total Protein, Urine: NEGATIVE
Urine Glucose: NEGATIVE
Urobilinogen, UA: 1 (ref 0.0–1.0)
pH: 6.5 (ref 5.0–8.0)

## 2022-12-27 MED ORDER — ROSUVASTATIN CALCIUM 40 MG PO TABS: 40.0000 mg | ORAL_TABLET | Freq: Every day | ORAL | 3 refills | Status: DC

## 2022-12-28 ENCOUNTER — Encounter: Payer: Self-pay | Admitting: Internal Medicine

## 2022-12-28 DIAGNOSIS — I509 Heart failure, unspecified: Secondary | ICD-10-CM | POA: Insufficient documentation

## 2022-12-28 NOTE — Assessment & Plan Note (Signed)
With recent worsening, for change lasix to 40 mg daily, daily wts, call in 1 wk if not improved for possible bid dosing, labs today includiing cbc and bmp, echocardiogram, and f/y here 2 wks

## 2022-12-28 NOTE — Assessment & Plan Note (Signed)
Lab Results  Component Value Date   LDLCALC 81 12/26/2022   Uncontrolled, goal ldl < 70, pt to continue current statin crestor 40 mg qd, lower chol diet, diet, declines add zetia for now

## 2022-12-28 NOTE — Assessment & Plan Note (Signed)
Last vitamin D Lab Results  Component Value Date   VD25OH 22.14 (L) 12/26/2022   Low, to start oral replacement

## 2022-12-28 NOTE — Assessment & Plan Note (Signed)
Lab Results  Component Value Date   HGBA1C 5.4 12/26/2022   Stable, pt to continue current medical treatment  - diet wt control

## 2023-01-02 DIAGNOSIS — Z6829 Body mass index (BMI) 29.0-29.9, adult: Secondary | ICD-10-CM | POA: Diagnosis not present

## 2023-01-02 DIAGNOSIS — M4802 Spinal stenosis, cervical region: Secondary | ICD-10-CM | POA: Diagnosis not present

## 2023-01-11 ENCOUNTER — Ambulatory Visit (HOSPITAL_COMMUNITY): Payer: Medicare Other | Attending: Cardiovascular Disease

## 2023-01-11 DIAGNOSIS — I5030 Unspecified diastolic (congestive) heart failure: Secondary | ICD-10-CM

## 2023-01-12 LAB — ECHOCARDIOGRAM COMPLETE
Area-P 1/2: 2.43 cm2
S' Lateral: 3.2 cm

## 2023-01-17 ENCOUNTER — Other Ambulatory Visit: Payer: Self-pay | Admitting: Internal Medicine

## 2023-01-17 ENCOUNTER — Encounter: Payer: Self-pay | Admitting: Internal Medicine

## 2023-01-17 ENCOUNTER — Ambulatory Visit (INDEPENDENT_AMBULATORY_CARE_PROVIDER_SITE_OTHER): Payer: Medicare Other | Admitting: Internal Medicine

## 2023-01-17 VITALS — BP 126/80 | HR 65 | Temp 99.4°F | Ht 71.0 in | Wt 212.0 lb

## 2023-01-17 DIAGNOSIS — E559 Vitamin D deficiency, unspecified: Secondary | ICD-10-CM

## 2023-01-17 DIAGNOSIS — R7302 Impaired glucose tolerance (oral): Secondary | ICD-10-CM

## 2023-01-17 DIAGNOSIS — E7800 Pure hypercholesterolemia, unspecified: Secondary | ICD-10-CM

## 2023-01-17 DIAGNOSIS — I509 Heart failure, unspecified: Secondary | ICD-10-CM

## 2023-01-17 DIAGNOSIS — N1831 Chronic kidney disease, stage 3a: Secondary | ICD-10-CM

## 2023-01-17 LAB — BASIC METABOLIC PANEL
BUN: 18 mg/dL (ref 6–23)
CO2: 31 mEq/L (ref 19–32)
Calcium: 9.2 mg/dL (ref 8.4–10.5)
Chloride: 100 mEq/L (ref 96–112)
Creatinine, Ser: 1.3 mg/dL (ref 0.40–1.50)
GFR: 54.37 mL/min — ABNORMAL LOW (ref >60.00)
Glucose, Bld: 87 mg/dL (ref 70–99)
Potassium: 2.9 mEq/L — ABNORMAL LOW (ref 3.5–5.1)
Sodium: 141 mEq/L (ref 135–145)

## 2023-01-17 MED ORDER — POTASSIUM CHLORIDE ER 10 MEQ PO TBCR: 10.0000 meq | EXTENDED_RELEASE_TABLET | Freq: Every day | ORAL | 3 refills | Status: DC

## 2023-01-17 NOTE — Patient Instructions (Signed)
Please continue all other medications as before, and refills have been done if requested.  Please have the pharmacy call with any other refills you may need.  Please continue your efforts at being more active, low cholesterol diet, and weight control.  Please keep your appointments with your specialists as you may have planned  Please call if you change your mind about the Cardiac CT score to check for blockages in the heart arteries  Please go to the LAB at the blood drawing area for the tests to be done  You will be contacted by phone if any changes need to be made immediately.  Otherwise, you will receive a letter about your results with an explanation, but please check with MyChart first.  Please remember to sign up for MyChart if you have not done so, as this will be important to you in the future with finding out test results, communicating by private email, and scheduling acute appointments online when needed.  Please make an Appointment to return in 6 months, or sooner if needed

## 2023-01-17 NOTE — Progress Notes (Unsigned)
Patient ID: Matthew Tucker, male   DOB: [birth year], 72 y.o.   MRN: [id number]        Chief Complaint: follow up chf, FH Cad, low vit d, hyperglycemia, ckd3a       HPI:  Matthew Tucker is a 72 y.o. male here overall improved with decresed leg swelling, though wt little changed; good tolerance with lasix 40 qd, Pt denies chest pain, increased sob or doe, wheezing, orthopnea, PND, increased LE swelling, palpitations, dizziness or syncope.   Pt denies polydipsia, polyuria, or new focal neuro s/s.    Pt denies fever, wt loss, night sweats, loss of appetite, or other constitutional symptoms         Wt Readings from Last 3 Encounters:  01/17/23 212 lb (96.2 kg)  12/26/22 210 lb (95.3 kg)  08/14/22 196 lb (88.9 kg)   BP Readings from Last 3 Encounters:  01/17/23 126/80  12/26/22 120/76  08/15/22 (!) 157/76         Past Medical History:  Diagnosis Date   Arthritis    Neck, bilateral hands   BACK PAIN, CHRONIC 10/31/2010   Cervical stenosis of spine    Chronic lumbar radiculopathy 05/20/2015   Diastolic dysfunction 05/21/2018   Family history of colon cancer 01/23/2013   Father died at 74yo   HYPERLIPIDEMIA September 08, 2007   Impaired glucose tolerance 08/29/2011   PARESTHESIA Sep 08, 2007   Toxic effect of chlorine gas(987.6) 10/31/2010   Past Surgical History:  Procedure Laterality Date   ANTERIOR CERVICAL DECOMP/DISCECTOMY FUSION N/A 08/14/2022   Procedure: ACDF - C2-C3;  Surgeon: Tia Alert, MD;  Location: [facility] OR;  Service: Neurosurgery;  Laterality: N/A;   BIOPSY  10/13/2021   Procedure: BIOPSY;  Surgeon: Napoleon Form, MD;  Location[facility];  Service: Endoscopy;;   BUNIONECTOMY     CARPAL TUNNEL RELEASE Right 08/14/2022   Procedure: Right carpal tunnel release;  Surgeon: Tia Alert, MD;  Location: [facility] OR;  Service: Neurosurgery;  Laterality: Right;   COLONOSCOPY     COLONOSCOPY WITH PROPOFOL N/A 10/13/2021   Procedure: COLONOSCOPY WITH PROPOFOL;  Surgeon: Napoleon Form,  MD;  Location: [facility];  Service: Endoscopy;  Laterality: N/A;   FOOT SURGERY Right    LUMBAR LAMINECTOMY/DECOMPRESSION MICRODISCECTOMY N/A 06/20/2018   Procedure: Laminectomy and Foraminotomy - Lumbar one-Lumbar two - Lumbar two-Lumbar three - Lumbar three-Lumbar four - Lumbar four-Lumbar five;  Surgeon: Tia Alert, MD;  Location: [facility] OR;  Service: Neurosurgery;  Laterality: N/A;   mass removal     back, forehead; benign (lipoma)    mass removal  2011   head; benign   POLYPECTOMY  10/13/2021   Procedure: POLYPECTOMY;  Surgeon: Napoleon Form, MD;  Location[facility];  Service: Endoscopy;;   POSTERIOR CERVICAL FUSION/FORAMINOTOMY N/A 01/03/2018   Procedure: Posterior Cervical Fusion with lateral mass fixation - Cervical three - Cervical seven, cervical laminectomy Cervical three-cervical seven;  Surgeon: Tia Alert, MD;  Location: [facility] OR;  Service: Neurosurgery;  Laterality: N/A;   ROOT CANAL     s/p lipoma right scalp posteriorly  2011   ULNAR NERVE TRANSPOSITION Right 08/14/2022   Procedure: Right ulnar release;  Surgeon: Tia Alert, MD;  Location: [facility] OR;  Service: Neurosurgery;  Laterality: Right;    reports that he has never smoked. He has never used smokeless tobacco. He reports current alcohol use. He reports current drug use. Drug: Marijuana. family history includes Cancer in his mother; Colon cancer (age of  onset: 81) in his father; Stroke (age of onset: 28) in his brother. Allergies  Allergen Reactions   Other Other (See Comments)    Nivaquine. Found in [place] for Malaria treatment  UNSPECIFIED REACTION    Levofloxacin Other (See Comments)    Irritates gums   Current Outpatient Medications on File Prior to Visit  Medication Sig Dispense Refill   aspirin EC 81 MG tablet Take 81 mg by mouth daily.     furosemide (LASIX) 40 MG tablet Take 1 tablet (40 mg total) by mouth daily. 90 tablet 3   ibuprofen (ADVIL) 600 MG tablet TAKE 1 TABLET(600 MG) BY  MOUTH EVERY 8 HOURS AS NEEDED 60 tablet 2   oxyCODONE-acetaminophen (PERCOCET) 5-325 MG tablet Take 1 tablet by mouth every 4 (four) hours as needed for severe pain. 20 tablet 0   rosuvastatin (CRESTOR) 40 MG tablet Take 1 tablet (40 mg total) by mouth daily. 90 tablet 3   sildenafil (VIAGRA) 100 MG tablet Take 0.5-1 tablets (50-100 mg total) by mouth daily as needed for erectile dysfunction. 10 tablet 11   tiZANidine (ZANAFLEX) 2 MG tablet Take 1 tablet (2 mg total) by mouth every 6 (six) hours as needed for muscle spasms. 60 tablet 2   No current facility-administered medications on file prior to visit.        ROS:  All others reviewed and negative.  Objective        PE:  BP 126/80 (BP Location: Right Arm, Patient Position: Sitting, Cuff Size: Normal)   Pulse 65   Temp 99.4 F (37.4 C) (Oral)   Ht 5\' 11"  (1.803 m)   Wt 212 lb (96.2 kg)   SpO2 99%   BMI 29.57 kg/m                 Constitutional: Pt appears in NAD               HENT: Head: NCAT.                Right Ear: External ear normal.                 Left Ear: External ear normal.                Eyes: . Pupils are equal, round, and reactive to light. Conjunctivae and EOM are normal               Nose: without d/c or deformity               Neck: Neck supple. Gross normal ROM               Cardiovascular: Normal rate and regular rhythm.                 Pulmonary/Chest: Effort normal and breath sounds without rales or wheezing.                Abd:  Soft, NT, ND, + BS, no organomegaly               Neurological: Pt is alert. At baseline orientation, motor grossly intact               Skin: Skin is warm. No rashes, no other new lesions, LE edema - none               Psychiatric: Pt behavior is normal without agitation   Micro: none  Cardiac tracings I have personally interpreted  today:  none  Pertinent Radiological findings (summarize): none   Lab Results  Component Value Date   WBC 6.7 12/26/2022   HGB 14.0 12/26/2022    HCT 42.1 12/26/2022   PLT 172.0 12/26/2022   GLUCOSE 87 01/17/2023   CHOL 163 12/26/2022   TRIG 57.0 12/26/2022   HDL 70.40 12/26/2022   LDLDIRECT 147.4 01/10/2013   LDLCALC 81 12/26/2022   ALT 14 12/26/2022   AST 24 12/26/2022   NA 141 01/17/2023   K 2.9 (L) 01/17/2023   CL 100 01/17/2023   CREATININE 1.30 01/17/2023   BUN 18 01/17/2023   CO2 31 01/17/2023   TSH 3.61 12/26/2022   PSA 2.38 12/26/2022   INR 1.0 08/10/2022   HGBA1C 5.4 12/26/2022   Assessment/Plan:  Fordyce A Beauregard is a 72 y.o. Black or African American [2] male with  has a past medical history of Arthritis, BACK PAIN, CHRONIC (10/31/2010), Cervical stenosis of spine, Chronic lumbar radiculopathy (05/20/2015), Diastolic dysfunction (05/21/2018), Family history of colon cancer (01/17/2013), HYPERLIPIDEMIA (09/02/2007), Impaired glucose tolerance (08/29/2011), PARESTHESIA (09/02/2007), and Toxic effect of chlorine gas(987.6) (10/31/2010).  CHF (congestive heart failure) (HCC) Symptomatically improved, echo with normal EF and diastolic dysfxn pt to cont lasix 40 qd, for f/u bmp  Vitamin D deficiency Last vitamin D Lab Results  Component Value Date   VD25OH 22.14 (L) 12/26/2022   Low, to start oral replacement   Impaired glucose tolerance Lab Results  Component Value Date   HGBA1C 5.4 12/26/2022   Stable, pt to continue current medical treatment  - diet, wt control   CKD (chronic kidney disease) stage 3, GFR 30-59 ml/min (HCC) Lab Results  Component Value Date   CREATININE 1.30 01/17/2023   Stable overall, cont to avoid nephrotoxins  Hyperlipidemia Lab Results  Component Value Date   LDLCALC 81 12/26/2022   Stable, pt to continue current statin crestor 40 mg,, also for card ct score  Followup: Return in about 6 months (around 07/20/2023).  Oliver Barre, MD 01/18/2023 7:44 PM [facility] [place] [facility] [facility] - [facility]

## 2023-01-18 ENCOUNTER — Encounter: Payer: Self-pay | Admitting: Internal Medicine

## 2023-01-18 NOTE — Assessment & Plan Note (Signed)
Lab Results  Component Value Date   CREATININE 1.30 01/17/2023   Stable overall, cont to avoid nephrotoxins

## 2023-01-18 NOTE — Assessment & Plan Note (Signed)
Lab Results  Component Value Date   HGBA1C 5.4 12/26/2022   Stable, pt to continue current medical treatment  - diet, wt control  

## 2023-01-18 NOTE — Assessment & Plan Note (Signed)
Last vitamin D Lab Results  Component Value Date   VD25OH 22.14 (L) 12/26/2022   Low, to start oral replacement  

## 2023-01-18 NOTE — Assessment & Plan Note (Signed)
Symptomatically improved, echo with normal EF and diastolic dysfxn pt to cont lasix 40 qd, for f/u bmp

## 2023-01-18 NOTE — Assessment & Plan Note (Signed)
Lab Results  Component Value Date   LDLCALC 81 12/26/2022   Stable, pt to continue current statin crestor 40 mg,, also for card ct score

## 2023-04-02 ENCOUNTER — Other Ambulatory Visit: Payer: Self-pay

## 2023-04-02 ENCOUNTER — Telehealth: Payer: Self-pay | Admitting: Internal Medicine

## 2023-04-02 MED ORDER — IBUPROFEN 600 MG PO TABS: ORAL_TABLET | ORAL | 2 refills | Status: DC

## 2023-04-02 NOTE — Telephone Encounter (Signed)
Refill Sent. 

## 2023-04-02 NOTE — Telephone Encounter (Signed)
Prescription Request  04/02/2023  LOV: 01/17/2023  What is the name of the medication or equipment? Ibuprofen 600 mg.  Have you contacted your pharmacy to request a refill? Yes   Which pharmacy would you like this sent to?  [id number] - [place], [place] - [address] AT [facility] [address] [place] [address] Phone: [phone] Fax: [phone]    Patient notified that their request is being sent to the clinical staff for review and that they should receive a response within 2 business days.   Please advise at Mobile [phone] (mobile)

## 2023-05-21 DIAGNOSIS — N5201 Erectile dysfunction due to arterial insufficiency: Secondary | ICD-10-CM | POA: Diagnosis not present

## 2023-05-21 DIAGNOSIS — R3121 Asymptomatic microscopic hematuria: Secondary | ICD-10-CM | POA: Diagnosis not present

## 2023-05-21 DIAGNOSIS — N400 Enlarged prostate without lower urinary tract symptoms: Secondary | ICD-10-CM | POA: Diagnosis not present

## 2023-06-21 ENCOUNTER — Ambulatory Visit: Payer: Medicare Other

## 2023-06-21 ENCOUNTER — Telehealth: Payer: Self-pay

## 2023-06-21 ENCOUNTER — Telehealth: Payer: Self-pay | Admitting: Internal Medicine

## 2023-06-21 VITALS — BP 128/78 | HR 65 | Ht 70.0 in | Wt 209.6 lb

## 2023-06-21 DIAGNOSIS — Z0000 Encounter for general adult medical examination without abnormal findings: Secondary | ICD-10-CM

## 2023-06-21 DIAGNOSIS — Z23 Encounter for immunization: Secondary | ICD-10-CM

## 2023-06-21 DIAGNOSIS — M549 Dorsalgia, unspecified: Secondary | ICD-10-CM

## 2023-06-21 DIAGNOSIS — M542 Cervicalgia: Secondary | ICD-10-CM

## 2023-06-21 NOTE — Telephone Encounter (Signed)
Patient has a concern in regards to strengthening his muscles due to neck and back surgery.  He is requesting for a referral to have physical therapy.

## 2023-06-21 NOTE — Telephone Encounter (Signed)
Ok this is done 

## 2023-06-21 NOTE — Telephone Encounter (Signed)
Pt has dropped off handicapped placard forms and they have been placed in the providers box.   Please call pt when forms are ready for pickup: [phone]

## 2023-06-21 NOTE — Patient Instructions (Addendum)
Matthew Tucker , Thank you for taking time to come for your Medicare Wellness Visit. I appreciate your ongoing commitment to your health goals. Please review the following plan we discussed and let me know if I can assist you in the future.   Referrals/Orders/Follow-Ups/Clinician Recommendations: You are due for a Covid vaccine.  I have sent a message to Dr. Jonny Ruiz in regards to more physical therapy.  It was nice speaking with you today.  Keep up the good work and  each day, aim for 6 glasses of water, plenty of protein in your diet and try to get up and walk/ stretch every hour for 5-10 minutes at a time.    This is a list of the screening recommended for you and due dates:  Health Maintenance  Topic Date Due   Zoster (Shingles) Vaccine (1 of 2) 03/16/1999   COVID-19 Vaccine (5 - 2023-24 season) 05/13/2023   Medicare Annual Wellness Visit  06/20/2024   DTaP/Tdap/Td vaccine (3 - Td or Tdap) 02/19/2029   Colon Cancer Screening  10/14/2031   Pneumonia Vaccine  Completed   Flu Shot  Completed   Hepatitis C Screening  Completed   HPV Vaccine  Aged Out    Advanced directives: (Copy Requested) Please bring a copy of your health care power of attorney and living will to the office to be added to your chart at your convenience.  Next Medicare Annual Wellness Visit scheduled for next year: Yes

## 2023-06-21 NOTE — Progress Notes (Signed)
Subjective:   Matthew Tucker is a 72 y.o. male who presents for Medicare Annual/Subsequent preventive examination.  Visit Complete: In person   Cardiac Risk Factors include: advanced age (>31men, >5 women);male gender;Other (see comment);dyslipidemia, Risk factor comments: CHF     Objective:    Today's Vitals   06/21/23 1300 06/21/23 1301  BP:  128/78  Pulse:  65  SpO2:  99%  Weight: 209 lb 9.6 oz (95.1 kg) 209 lb 9.6 oz (95.1 kg)  Height: 5\' 10"  (1.778 m) 5\' 10"  (1.778 m)  PainSc:  4    Body mass index is 30.07 kg/m.     06/21/2023    1:15 PM 08/14/2022   10:10 PM 08/14/2022   12:05 PM 08/10/2022   11:01 AM 06/08/2022    8:50 AM 10/13/2021    9:56 AM 06/07/2021    9:26 AM  Advanced Directives  Does Patient Have a Medical Advance Directive? No No No No No No No  Would patient like information on creating a medical advance directive?  No - Patient declined  No - Patient declined No - Patient declined  No - Patient declined    Current Medications (verified) Outpatient Encounter Medications as of 06/21/2023  Medication Sig   aspirin EC 81 MG tablet Take 81 mg by mouth daily.   furosemide (LASIX) 40 MG tablet Take 1 tablet (40 mg total) by mouth daily.   ibuprofen (ADVIL) 600 MG tablet TAKE 1 TABLET(600 MG) BY MOUTH EVERY 8 HOURS AS NEEDED   rosuvastatin (CRESTOR) 40 MG tablet Take 1 tablet (40 mg total) by mouth daily.   sildenafil (VIAGRA) 100 MG tablet Take 0.5-1 tablets (50-100 mg total) by mouth daily as needed for erectile dysfunction.   oxyCODONE-acetaminophen (PERCOCET) 5-325 MG tablet Take 1 tablet by mouth every 4 (four) hours as needed for severe pain. (Patient not taking: Reported on 06/21/2023)   potassium chloride (KLOR-CON 10) 10 MEQ tablet Take 1 tablet (10 mEq total) by mouth daily. (Patient not taking: Reported on 06/21/2023)   tiZANidine (ZANAFLEX) 2 MG tablet Take 1 tablet (2 mg total) by mouth every 6 (six) hours as needed for muscle spasms. (Patient  not taking: Reported on 06/21/2023)   No facility-administered encounter medications on file as of 06/21/2023.    Allergies (verified) Other and Levofloxacin   History: Past Medical History:  Diagnosis Date   Arthritis    Neck, bilateral hands   BACK PAIN, CHRONIC 10/31/2010   Cervical stenosis of spine    Chronic lumbar radiculopathy 05/20/2015   Diastolic dysfunction 05/21/2018   Family history of colon cancer 02-16-13   Father died at 70yo   HYPERLIPIDEMIA 10/02/07   Impaired glucose tolerance 08/29/2011   PARESTHESIA 2007-10-02   Toxic effect of chlorine gas(987.6) 10/31/2010   Past Surgical History:  Procedure Laterality Date   ANTERIOR CERVICAL DECOMP/DISCECTOMY FUSION N/A 08/14/2022   Procedure: ACDF - C2-C3;  Surgeon: Tia Alert, MD;  Location: [facility] OR;  Service: Neurosurgery;  Laterality: N/A;   BIOPSY  10/13/2021   Procedure: BIOPSY;  Surgeon: Napoleon Form, MD;  Location: [facility];  Service: Endoscopy;;   BUNIONECTOMY     CARPAL TUNNEL RELEASE Right 08/14/2022   Procedure: Right carpal tunnel release;  Surgeon: Tia Alert, MD;  Location:  [facility] OR;  Service: Neurosurgery;  Laterality: Right;   COLONOSCOPY     COLONOSCOPY WITH PROPOFOL N/A 10/13/2021   Procedure: COLONOSCOPY WITH PROPOFOL;  Surgeon: Napoleon Form, MD;  Location: [facility];  Service: Endoscopy;  Laterality: N/A;   FOOT SURGERY Right    LUMBAR LAMINECTOMY/DECOMPRESSION MICRODISCECTOMY N/A 06/20/2018   Procedure: Laminectomy and Foraminotomy - Lumbar one-Lumbar two - Lumbar two-Lumbar three - Lumbar three-Lumbar four - Lumbar four-Lumbar five;  Surgeon: Tia Alert, MD;  Location: [facility] OR;  Service: Neurosurgery;  Laterality: N/A;   mass removal     back, forehead; benign (lipoma)    mass removal  2011   head; benign   POLYPECTOMY  10/13/2021   Procedure: POLYPECTOMY;  Surgeon: Napoleon Form, MD;  Location: [facility];  Service: Endoscopy;;   POSTERIOR CERVICAL  FUSION/FORAMINOTOMY N/A 01/03/2018   Procedure: Posterior Cervical Fusion with lateral mass fixation - Cervical three - Cervical seven, cervical laminectomy Cervical three-cervical seven;  Surgeon: Tia Alert, MD;  [facility];  Service: Neurosurgery;  Laterality: N/A;   ROOT CANAL     s/p lipoma right scalp posteriorly  2011   ULNAR NERVE TRANSPOSITION Right 08/14/2022   Procedure: Right ulnar release;  Surgeon: Tia Alert, MD;  Location: [facility] OR;  Service: Neurosurgery;  Laterality: Right;   Family History  Problem Relation Age of Onset   Cancer Mother        colon cancer   Colon cancer Father 3   Stroke Brother 49   Esophageal cancer Neg Hx    Stomach cancer Neg Hx    Rectal cancer Neg Hx    Social History   Socioeconomic History   Marital status: Married    Spouse name: Patrica   Number of children: 2   Years of education: Not on file   Highest education level: Associate degree: occupational, Scientist, product/process development, or vocational program  Occupational History   Occupation: Veterinary surgeon: [employer]   Occupation: Retired  Tobacco Use   Smoking status: Never   Smokeless tobacco: Never  Vaping Use   Vaping status: Never Used  Substance and Sexual Activity   Alcohol use: Yes    Alcohol/week: 0.0 standard drinks of alcohol    Comment: occasional   Drug use: Not Currently    Types: Marijuana    Comment: last used a "couple of days ago"   Sexual activity: Yes  Other Topics Concern   Not on file  Social History Narrative   Not on file   Social Determinants of Health   Financial Resource Strain: Low Risk  (06/21/2023)   Overall Financial Resource Strain (CARDIA)    Difficulty of Paying Living Expenses: Not hard at all  Food Insecurity: No Food Insecurity (06/21/2023)   Hunger Vital Sign    Worried About Running Out of Food in the Last Year: Never true    Ran Out of Food in the Last Year: Never true  Transportation Needs: No Transportation Needs (06/21/2023)    PRAPARE - Administrator, Civil Service (Medical): No    Lack of Transportation (Non-Medical): No  Physical Activity: Inactive (06/21/2023)   Exercise Vital Sign    Days of Exercise per Week: 0 days    Minutes of Exercise per Session: 0 min  Stress: Stress Concern Present (06/21/2023)   [employer] of Occupational Health - Occupational Stress Questionnaire    Feeling of Stress : Rather much  Social Connections: Moderately Isolated (06/21/2023)   Social Connection and Isolation Panel [NHANES]    Frequency of Communication with Friends and Family: More than three times a week    Frequency of Social Gatherings with Friends and Family: Never  Attends Religious Services: Never    Active Member of Clubs or Organizations: No    Attends Banker Meetings: Never    Marital Status: Married    Tobacco Counseling Counseling given: Not Answered   Clinical Intake:  Pre-visit preparation completed: Yes  Pain : 0-10 Pain Score: 4  Pain Type: Acute pain Pain Location: Neck Pain Orientation: Lower Pain Onset: More than a month ago Pain Frequency: Intermittent Pain Relieving Factors: Tylenol,  Pain Relieving Factors: Tylenol,  BMI - recorded: 30.07 Nutritional Risks: None Diabetes: No  How often do you need to have someone help you when you read instructions, pamphlets, or other written materials from your doctor or pharmacy?: 1 - Never  Interpreter Needed?: No  Information entered by :: Mahati Vajda, RMA   Activities of Daily Living    06/21/2023   12:46 PM 08/14/2022   10:10 PM  In your present state of health, do you have any difficulty performing the following activities:  Hearing? 0 0  Vision? 0 0  Difficulty concentrating or making decisions? 0 0  Walking or climbing stairs? 0 0  Dressing or bathing? 0 0  Doing errands, shopping? 0 0  Preparing Food and eating ? N   Using the Toilet? N   In the past six months, have you accidently  leaked urine? N   Do you have problems with loss of bowel control? N   Managing your Medications? N   Managing your Finances? N   Housekeeping or managing your Housekeeping? N     Patient Care Team: Corwin Levins, MD as PCP - General Glenford Peers, OD as Referring Physician (Optometry)  Indicate any recent Medical Services you may have received from other than [facility] providers in the past year (date may be approximate).     Assessment:   This is a routine wellness examination for Daeshon.  Hearing/Vision screen Hearing Screening - Comments:: Denies hearing difficulties   Vision Screening - Comments:: Wears eyeglasses.   Goals Addressed               This Visit's Progress     Patient Stated (pt-stated)   On track     To maintain my current health status by continuing to eat healthy.        Depression Screen    06/21/2023    1:23 PM 12/26/2022    2:15 PM 06/08/2022    9:02 AM 12/23/2021    3:07 PM 12/23/2021    2:36 PM 06/07/2021    9:27 AM 12/21/2020    9:48 AM  PHQ 2/9 Scores  PHQ - 2 Score 1 0 0 0 0 0 0  PHQ- 9 Score 1 1         Fall Risk    06/21/2023    1:15 PM 12/26/2022    2:15 PM 06/08/2022    8:51 AM 12/23/2021    3:07 PM 12/23/2021    2:36 PM  Fall Risk   Falls in the past year? 0 0 0 0 0  Number falls in past yr:  0 0 0 0  Injury with Fall?  0 0 0 0  Risk for fall due to :  No Fall Risks No Fall Risks    Follow up Falls evaluation completed;Falls prevention discussed Falls evaluation completed Falls prevention discussed      MEDICARE RISK AT HOME: Medicare Risk at Home Any stairs in or around the home?: Yes (Inside and outside) If so, are there  any without handrails?: Yes Home free of loose throw rugs in walkways, pet beds, electrical cords, etc?: Yes Adequate lighting in your home to reduce risk of falls?: Yes Life alert?: No Use of a cane, walker or w/c?: No Grab bars in the bathroom?: No Shower chair or bench in shower?: No Elevated  toilet seat or a handicapped toilet?: No  TIMED UP AND GO:  Was the test performed?  Yes  Length of time to ambulate 10 feet: 15 sec Gait slow and steady without use of assistive device    Cognitive Function:        06/21/2023    1:24 PM 06/08/2022    9:06 AM 02/20/2020    8:43 AM  6CIT Screen  What Year? 0 points 0 points 0 points  What month? 0 points 0 points 0 points  What time? 0 points 0 points 0 points  Count back from 20 0 points 0 points 0 points  Months in reverse 0 points 0 points 0 points  Repeat phrase 2 points 0 points 0 points  Total Score 2 points 0 points 0 points    Immunizations Immunization History  Administered Date(s) Administered   Fluad Quad(high Dose 65+) 06/07/2021, 06/20/2023   Fluad Trivalent(High Dose 65+) 06/21/2023   Influenza Split 09/08/2011   Influenza Whole 06/12/2007   Influenza, High Dose Seasonal PF 05/20/2015, 08/22/2017, 05/17/2018   Influenza-Unspecified 06/10/2019   PFIZER(Purple Top)SARS-COV-2 Vaccination 11/10/2019, 12/11/2019, 06/21/2020, 03/08/2021   Pneumococcal Conjugate-13 01/29/2015   Pneumococcal Polysaccharide-23 08/22/2017   Td 01/04/2009   Tdap 02/20/2019   Zoster Recombinant(Shingrix) 01/03/2023, 03/26/2023   Zoster, Live 02/05/2014    TDAP status: Up to date  Flu Vaccine status: Due, Education has been provided regarding the importance of this vaccine. Advised may receive this vaccine at local pharmacy or Health Dept. Aware to provide a copy of the vaccination record if obtained from local pharmacy or Health Dept. Verbalized acceptance and understanding.  Pneumococcal vaccine status: Up to date  Covid-19 vaccine status: Information provided on how to obtain vaccines.   Qualifies for Shingles Vaccine? Yes   Zostavax completed Yes   Shingrix Completed?: Yes  Screening Tests Health Maintenance  Topic Date Due   COVID-19 Vaccine (5 - 2023-24 season) 05/13/2023   Medicare Annual Wellness (AWV)  06/20/2024    DTaP/Tdap/Td (3 - Td or Tdap) 02/19/2029   Colonoscopy  10/14/2031   Pneumonia Vaccine 44+ Years old  Completed   INFLUENZA VACCINE  Completed   Hepatitis C Screening  Completed   Zoster Vaccines- Shingrix  Completed   HPV VACCINES  Aged Out    Health Maintenance  Health Maintenance Due  Topic Date Due   COVID-19 Vaccine (5 - 2023-24 season) 05/13/2023    Colorectal cancer screening: No longer required.   Lung Cancer Screening: (Low Dose CT Chest recommended if Age 44-80 years, 20 pack-year currently smoking OR have quit w/in 15years.) does qualify.   Lung Cancer Screening Referral: N/A  Additional Screening:  Hepatitis C Screening: does qualify; Completed 05/20/2015  Vision Screening: Recommended annual ophthalmology exams for early detection of glaucoma and other disorders of the eye. Is the patient up to date with their annual eye exam?  No  Who is the provider or what is the name of the office in which the patient attends annual eye exams? [facility] If pt is not established with a provider, would they like to be referred to a provider to establish care? No .   Dental  Screening: Recommended annual dental exams for proper oral hygiene   Community Resource Referral / Chronic Care Management: CRR required this visit?  No   CCM required this visit?  No     Plan:     I have personally reviewed and noted the following in the patient's chart:   Medical and social history Use of alcohol, tobacco or illicit drugs  Current medications and supplements including opioid prescriptions. Patient is not currently taking opioid prescriptions. Functional ability and status Nutritional status Physical activity Advanced directives List of other physicians Hospitalizations, surgeries, and ER visits in previous 12 months Vitals Screenings to include cognitive, depression, and falls Referrals and appointments  In addition, I have reviewed and discussed with patient certain  preventive protocols, quality metrics, and best practice recommendations. A written personalized care plan for preventive services as well as general preventive health recommendations were provided to patient.     Sayward Horvath L Naidelyn Parrella, CMA   06/21/2023   After Visit Summary: (MyChart) Due to this being a telephonic visit, the after visit summary with patients personalized plan was offered to patient via MyChart   Nurse Notes: Patient is due for a Covid vaccine.  Patient has a concern in regards to strengthening his muscles due to neck and back surgery.  He is requesting for a referral to have physical therapy.  He had no other concerns to address today.

## 2023-06-22 NOTE — Telephone Encounter (Signed)
Placed on providers desk

## 2023-06-25 NOTE — Telephone Encounter (Signed)
Called and left voicemail, stating forms are ready for pickup and placed up front.

## 2023-06-27 NOTE — Telephone Encounter (Signed)
Patient called and said his daughter Simon Llamas will be picking up the forms.

## 2023-07-02 NOTE — Telephone Encounter (Signed)
Patient has picked up forms.

## 2023-07-11 ENCOUNTER — Ambulatory Visit: Payer: Medicare Other | Admitting: Physical Therapy

## 2023-07-12 ENCOUNTER — Ambulatory Visit: Payer: Medicare Other | Attending: Internal Medicine | Admitting: Physical Therapy

## 2023-07-12 ENCOUNTER — Telehealth: Payer: Self-pay | Admitting: Physical Therapy

## 2023-07-12 DIAGNOSIS — M6281 Muscle weakness (generalized): Secondary | ICD-10-CM | POA: Insufficient documentation

## 2023-07-12 DIAGNOSIS — R2689 Other abnormalities of gait and mobility: Secondary | ICD-10-CM | POA: Diagnosis not present

## 2023-07-12 DIAGNOSIS — M5459 Other low back pain: Secondary | ICD-10-CM | POA: Diagnosis not present

## 2023-07-12 DIAGNOSIS — R2681 Unsteadiness on feet: Secondary | ICD-10-CM | POA: Diagnosis not present

## 2023-07-12 DIAGNOSIS — M5416 Radiculopathy, lumbar region: Secondary | ICD-10-CM | POA: Diagnosis not present

## 2023-07-12 DIAGNOSIS — M542 Cervicalgia: Secondary | ICD-10-CM | POA: Insufficient documentation

## 2023-07-12 DIAGNOSIS — M549 Dorsalgia, unspecified: Secondary | ICD-10-CM | POA: Diagnosis not present

## 2023-07-12 DIAGNOSIS — R29898 Other symptoms and signs involving the musculoskeletal system: Secondary | ICD-10-CM

## 2023-07-12 NOTE — Telephone Encounter (Signed)
Dr. Jonny Ruiz,   Matthew Tucker was evaluated by PT on 10/31.  The patient would benefit from an OT evaluation for R hand weakness.    If you agree, please place an order in [facility] workque in [facility] or fax the order to [phone].  Thank you, Josephine Igo, PT, DPT [facility] [address] [place], [place]  [address] Phone:  [phone] Fax:  [phone]

## 2023-07-12 NOTE — Therapy (Signed)
OUTPATIENT PHYSICAL THERAPY CERVICAL AND THORACOLUMBAR EVALUATION   Patient Name: Matthew Tucker MRN: [id number] DOB:[birth year], 72 y.o., male Today's Date: 07/12/2023  END OF SESSION:  PT End of Session - 07/12/23 0919     Visit Number 1    Number of Visits 17    Date for PT Re-Evaluation 09/20/23    Authorization Type Medicare    Progress Note Due on Visit 10    PT Start Time 0917    PT Stop Time 1004    PT Time Calculation (min) 47 min    Activity Tolerance Patient tolerated treatment well    Behavior During Therapy [facility] for tasks assessed/performed             Past Medical History:  Diagnosis Date   Arthritis    Neck, bilateral hands   BACK PAIN, CHRONIC 10/31/2010   Cervical stenosis of spine    Chronic lumbar radiculopathy 05/20/2015   Diastolic dysfunction 05/21/2018   Family history of colon cancer 2013/01/27   Father died at 70yo   HYPERLIPIDEMIA 12-Sep-2007   Impaired glucose tolerance 08/29/2011   PARESTHESIA 2007/09/12   Toxic effect of chlorine gas(987.6) 10/31/2010   Past Surgical History:  Procedure Laterality Date   ANTERIOR CERVICAL DECOMP/DISCECTOMY FUSION N/A 08/14/2022   Procedure: ACDF - C2-C3;  Surgeon: Tia Alert, MD;  Location: [facility] OR;  Service: Neurosurgery;  Laterality: N/A;   BIOPSY  10/13/2021   Procedure: BIOPSY;  Surgeon: Napoleon Form, MD;  Location: [facility];  Service: Endoscopy;;   BUNIONECTOMY     CARPAL TUNNEL RELEASE Right 08/14/2022   Procedure: Right carpal tunnel release;  Surgeon: Tia Alert, MD;  Location: [facility] OR;  Service: Neurosurgery;  Laterality: Right;   COLONOSCOPY     COLONOSCOPY WITH PROPOFOL N/A 10/13/2021   Procedure: COLONOSCOPY WITH PROPOFOL;  Surgeon: Napoleon Form, MD;  Location: [facility];  Service: Endoscopy;  Laterality: N/A;   FOOT SURGERY Right    LUMBAR LAMINECTOMY/DECOMPRESSION MICRODISCECTOMY N/A 06/20/2018   Procedure: Laminectomy and Foraminotomy - Lumbar one-Lumbar two - Lumbar  two-Lumbar three - Lumbar three-Lumbar four - Lumbar four-Lumbar five;  Surgeon: Tia Alert, MD;  Location: [facility] OR;  Service: Neurosurgery;  Laterality: N/A;   mass removal     back, forehead; benign (lipoma)    mass removal  2011   head; benign   POLYPECTOMY  10/13/2021   Procedure: POLYPECTOMY;  Surgeon: Napoleon Form, MD;  Location: [facility];  Service: Endoscopy;;   POSTERIOR CERVICAL FUSION/FORAMINOTOMY N/A 01/03/2018   Procedure: Posterior Cervical Fusion with lateral mass fixation - Cervical three - Cervical seven, cervical laminectomy Cervical three-cervical seven;  Surgeon: Tia Alert, MD;  Location: [facility] OR;  Service: Neurosurgery;  Laterality: N/A;   ROOT CANAL     s/p lipoma right scalp posteriorly  2011   ULNAR NERVE TRANSPOSITION Right 08/14/2022   Procedure: Right ulnar release;  Surgeon: Tia Alert, MD;  Location: [facility] OR;  Service: Neurosurgery;  Laterality: Right;   Patient Active Problem List   Diagnosis Date Noted   CHF (congestive heart failure) (HCC) 12/28/2022   S/P cervical spinal fusion 08/14/2022   Muscle atrophy 05/05/2022   COVID-19 virus infection 04/06/2022   Polyp of ascending colon    Polyp of cecum    Polyp of rectum    Degeneration of lumbar intervertebral disc 12/21/2020   Osteoarthritis 12/21/2020   Rheumatoid arthritis (HCC) 12/21/2020   Vitamin D deficiency 08/22/2019   Polyarthralgia 08/22/2019  Hypokalemia 08/22/2019   CKD (chronic kidney disease) stage 3, GFR 30-59 ml/min (HCC) 02/20/2019   Hand joint stiff, unspecified laterality 02/20/2019   S/P lumbar laminectomy 06/20/2018   Diastolic dysfunction 05/21/2018   Peripheral edema 05/17/2018   Dyspnea 05/17/2018   Cervical vertebral fusion 01/03/2018   Congenital spinal stenosis of lumbar region 08/22/2017   Bilateral hand pain 03/24/2017   Elevated blood pressure reading without diagnosis of hypertension 03/24/2017   Grief reaction 01/02/2017   Nocturia 04/28/2016    Erectile dysfunction 04/28/2016   Chronic lumbar radiculopathy 05/20/2015   Increased prostate specific antigen (PSA) velocity 05/20/2015   Paresthesia of right leg 01/29/2015   Syncope 04/13/2014   EKG abnormality 04/13/2014   Family history of colon cancer 01/17/2013   Allergic rhinitis 09/10/2011   Microhematuria 09/08/2011   Impaired glucose tolerance 08/29/2011   Colon cancer screening 08/29/2011   Backache 10/31/2010   Toxic effect of chlorine gas 10/31/2010   WEAKNESS, RIGHT SIDE OF BODY 02/28/2010   HIP PAIN, RIGHT 12/02/2007   Pain in joint, lower leg 12/02/2007   Pain in Soft Tissues of Limb 12/02/2007   Hyperlipidemia 09/02/2007   Anxiety state 09/02/2007   PARESTHESIA 09/02/2007    PCP: Corwin Levins, MD  REFERRING PROVIDER: Corwin Levins, MD  REFERRING DIAG:  M54.9 (ICD-10-CM) - Bilateral back pain, unspecified back location, unspecified chronicity  M54.2 (ICD-10-CM) - Neck pain    Rationale for Evaluation and Treatment: Rehabilitation  THERAPY DIAG:  Muscle weakness (generalized)  Other abnormalities of gait and mobility  Unsteadiness on feet  Other low back pain  Cervicalgia  Radiculopathy, lumbar region  ONSET DATE: 06/21/23 (referral date)  SUBJECTIVE:                                                                                                                                                                                           SUBJECTIVE STATEMENT: Pt presents w/daughter, [name]. Pt reports he has had two neck surgeries and one back surgery (2019). States he thinks his problem is sciatica, RLE > LLE. States he has numbness of his R hand as well, had carpal tunnel surgery but did not help. Daughter reports his gait is off, he swings his RLE in front of his body and frequently loses balance to the left side. Denies falls but has near misses in his home frequently and catches himself on the wall. Pt reports he stopped moving after his low  back surgery in 2019 because his surgeon told him not move. Has lost a lot of muscle mass. Pt is very motivated to get back to moving.   PERTINENT HISTORY:  Anterior Cervical Decompression/discetomy and fusion of C2-C3 (12/23), R carpal tunnel release (12/23), R ulnar nerve release (12/23), lumbar laminectomy/decompression microdiscetomy L1-L5 (2019), posterior cervical fusion/foraminotomy C3-C7 (2019), osteoarthritis of neck and bilateral hands, chronic back pain, cervical stenosis of spine, chronic lumbar radiculopathy, hyperlipidemia, CHF, CKD stage 3, hx of R foot surgery  PAIN:  Are you having pain? No Pt reports he cannot sit or walk for too long   PRECAUTIONS: Fall  RED FLAGS: None   WEIGHT BEARING RESTRICTIONS: No  FALLS:  Has patient fallen in last 6 months? No, but plenty of near misses   LIVING ENVIRONMENT: Lives with: lives with their spouse Lives in: House/apartment Stairs: Yes: Internal: 17 steps; on right going up, on left going up, and can reach both and External: 5 steps; on right going up, on left going up, and can reach both Has following equipment at home: Single point cane and Walker - 2 wheeled  OCCUPATION: Retired   PLOF: Independent  PATIENT GOALS: "I just want to work on every thing"    OBJECTIVE:  Note: Objective measures were completed at Evaluation unless otherwise noted.  DIAGNOSTIC FINDINGS:  No new imaging since operations on neck and low back  PATIENT SURVEYS:  Modified Oswestry to be assessed   SCREENING FOR RED FLAGS: Bowel or bladder incontinence: No Spinal tumors: No Cauda equina syndrome: No Compression fracture: No Abdominal aneurysm: No  COGNITION: Overall cognitive status: Within functional limits for tasks assessed     SENSATION: Pt reports frequent numbness in his R hand and sciatica of RLE    POSTURE: rounded shoulders and forward head Pt able to sit in figure-4 position on R side without pain or pulling of  hip   CERVICAL ROM:   Active ROM A/PROM (deg) eval  Flexion   Extension   Right lateral flexion   Left lateral flexion   Right rotation   Left rotation    (Blank rows = not tested)     LOWER EXTREMITY MMT:  Tested in seated position   MMT Right eval Left eval  Hip flexion 4+ 5  Hip extension    Hip abduction 4+ 5  Hip adduction 4+ 5  Hip internal rotation    Hip external rotation    Knee flexion 5 5  Knee extension 5 5  Ankle dorsiflexion 5 5  Ankle plantarflexion    Ankle inversion    Ankle eversion     (Blank rows = not tested)   FUNCTIONAL TESTS:   [facility] PT Assessment - 07/12/23 1000       Ambulation/Gait   Gait velocity 32.8' over 11.16s = 2.93 ft/s   no AD             GAIT: Distance walked: various short clinic distances  Assistive device utilized: None Level of assistance: SBA Comments: Noted significant scissoring of RLE w/lateral gait deviations to L side   TODAY'S TREATMENT:  N/A eval only   PATIENT EDUCATION:  Education details: POC, eval findings, plan to obtain OT referral.  Person educated: Patient and Child(ren) Education method: Medical illustrator Education comprehension: verbalized understanding  HOME EXERCISE PROGRAM: To be initiated  ASSESSMENT:  CLINICAL IMPRESSION: Patient is a 72 year old male referred to Neuro [facility] for neck and low back pain. Pt's PMH is significant for: Anterior Cervical Decompression/discetomy and fusion of C2-C3 (12/23), R carpal tunnel release (12/23), R ulnar nerve release (12/23), lumbar laminectomy/decompression microdiscetomy L1-L5 (2019), posterior cervical fusion/foraminotomy C3-C7 (2019), osteoarthritis of neck and bilateral hands, chronic back pain, cervical stenosis of spine, chronic lumbar radiculopathy, hyperlipidemia, CHF, CKD stage 3, hx of R foot surgery  The following deficits were present during the exam: decreased functional strength and endurance, impaired sensation, impaired gait kinematics and improper body mechanics. Based on sedentary lifestyle and frequent near misses, pt is an incr risk for falls. Pt would benefit from skilled PT to address these impairments and functional limitations to maximize functional mobility independence.   OBJECTIVE IMPAIRMENTS: Abnormal gait, decreased activity tolerance, decreased balance, decreased coordination, decreased endurance, decreased knowledge of condition, decreased knowledge of use of DME, decreased mobility, difficulty walking, decreased strength, impaired flexibility, impaired sensation, impaired UE functional use, improper body mechanics, and pain  ACTIVITY LIMITATIONS: carrying, lifting, bending, sitting, standing, squatting, stairs, transfers, bathing, locomotion level, and caring for others  PARTICIPATION LIMITATIONS: meal prep, cleaning, laundry, driving, shopping, community activity, and yard work  PERSONAL FACTORS: Age, Fitness, Past/current experiences, and 1 comorbidity: Lumbar laminectomy and C3-C7 fusion  are also affecting patient's functional outcome.    REHAB POTENTIAL: Good  CLINICAL DECISION MAKING: Stable/uncomplicated  EVALUATION COMPLEXITY: Low   GOALS: Goals reviewed with patient? Yes  SHORT TERM GOALS: Target date: 08/09/2023    Pt will be independent with initial HEP for improved strength, balance, transfers and gait.  Baseline: not established on eval  Goal status: INITIAL  2.  FGA to be assessed and STG/LTG updated  Baseline:  Goal status: INITIAL  3.  5x STS to be assessed and STG/LTG updated  Baseline:  Goal status: INITIAL  4.  Pt will be compliant w/walking program for improved endurance and safety w/gait  Baseline:  Goal status: INITIAL   LONG TERM GOALS: Target date: 09/06/2023    Pt will be independent with final HEP for improved strength,  balance, transfers and gait.  Baseline:  Goal status: INITIAL  2.  FGA goal  Baseline:  Goal status: INITIAL  3.  Modified oswestry to be assessed and LTG updated  Baseline:  Goal status: INITIAL  4.  5x STS goal  Baseline:  Goal status: INITIAL  5.  Pt will improve gait velocity to at least 3.2 ft/s  for improved gait efficiency and safety  Baseline: 2.93 ft/s  Goal status: INITIAL   PLAN:  PT FREQUENCY: 2x/week  PT DURATION: 8 weeks  PLANNED INTERVENTIONS: 97164- PT Re-evaluation, 97110-Therapeutic exercises, 97530- Therapeutic activity, 97112- Neuromuscular re-education, 97535- Self Care, [id number]- Manual therapy, [id number]- Gait training, [phone]- Aquatic Therapy, [phone]- Electrical stimulation (manual), Balance training, Stair training, Dry Needling, Joint mobilization, Spinal mobilization, and DME instructions.  PLAN FOR NEXT SESSION: 5x STS, FGA and modified oswestry and update goals. Establish HEP for global strength and mobility    Khyrin Trevathan E Brayam Boeke, PT, DPT 07/12/2023, 10:11 AM

## 2023-07-17 ENCOUNTER — Ambulatory Visit: Payer: Medicare Other | Attending: Internal Medicine | Admitting: Physical Therapy

## 2023-07-17 ENCOUNTER — Encounter: Payer: Self-pay | Admitting: Physical Therapy

## 2023-07-17 VITALS — BP 158/90 | HR 66

## 2023-07-17 DIAGNOSIS — M5459 Other low back pain: Secondary | ICD-10-CM | POA: Insufficient documentation

## 2023-07-17 DIAGNOSIS — M542 Cervicalgia: Secondary | ICD-10-CM | POA: Diagnosis not present

## 2023-07-17 DIAGNOSIS — R29898 Other symptoms and signs involving the musculoskeletal system: Secondary | ICD-10-CM | POA: Diagnosis not present

## 2023-07-17 DIAGNOSIS — R2681 Unsteadiness on feet: Secondary | ICD-10-CM | POA: Diagnosis not present

## 2023-07-17 DIAGNOSIS — R2689 Other abnormalities of gait and mobility: Secondary | ICD-10-CM | POA: Insufficient documentation

## 2023-07-17 DIAGNOSIS — M6281 Muscle weakness (generalized): Secondary | ICD-10-CM | POA: Diagnosis not present

## 2023-07-17 DIAGNOSIS — M5416 Radiculopathy, lumbar region: Secondary | ICD-10-CM | POA: Diagnosis not present

## 2023-07-17 NOTE — Therapy (Signed)
OUTPATIENT PHYSICAL THERAPY CERVICAL AND THORACOLUMBAR TREATMENT    Patient Name: Matthew Tucker MRN: [id number] DOB:December 18, 1950, 72 y.o., male Today's Date: 07/17/2023  END OF SESSION:  PT End of Session - 07/17/23 1022     Visit Number 2    Number of Visits 17    Date for PT Re-Evaluation 09/20/23    Authorization Type Medicare    Progress Note Due on Visit 10    PT Start Time 1020    PT Stop Time 1100    PT Time Calculation (min) 40 min    Equipment Utilized During Treatment Gait belt    Activity Tolerance Patient tolerated treatment well    Behavior During Therapy WFL for tasks assessed/performed             Past Medical History:  Diagnosis Date   Arthritis    Neck, bilateral hands   BACK PAIN, CHRONIC 10/31/2010   Cervical stenosis of spine    Chronic lumbar radiculopathy 05/20/2015   Diastolic dysfunction 05/21/2018   Family history of colon cancer 02-05-13   Father died at 70yo   HYPERLIPIDEMIA September 21, 2007   Impaired glucose tolerance 08/29/2011   PARESTHESIA 09/21/2007   Toxic effect of chlorine gas(987.6) 10/31/2010   Past Surgical History:  Procedure Laterality Date   ANTERIOR CERVICAL DECOMP/DISCECTOMY FUSION N/A 08/14/2022   Procedure: ACDF - C2-C3;  Surgeon: Tia Alert, MD;  Location: [facility] OR;  Service: Neurosurgery;  Laterality: N/A;   BIOPSY  10/13/2021   Procedure: BIOPSY;  Surgeon: Napoleon Form, MD;  Location: [facility];  Service: Endoscopy;;   BUNIONECTOMY     CARPAL TUNNEL RELEASE Right 08/14/2022   Procedure: Right carpal tunnel release;  Surgeon: Tia Alert, MD;  Location: [facility] OR;  Service: Neurosurgery;  Laterality: Right;   COLONOSCOPY     COLONOSCOPY WITH PROPOFOL N/A 10/13/2021   Procedure: COLONOSCOPY WITH PROPOFOL;  Surgeon: Napoleon Form, MD;  Location: [facility];  Service: Endoscopy;  Laterality: N/A;   FOOT SURGERY Right    LUMBAR LAMINECTOMY/DECOMPRESSION MICRODISCECTOMY N/A 06/20/2018   Procedure: Laminectomy and  Foraminotomy - Lumbar one-Lumbar two - Lumbar two-Lumbar three - Lumbar three-Lumbar four - Lumbar four-Lumbar five;  Surgeon: Tia Alert, MD;  Location: [facility] OR;  Service: Neurosurgery;  Laterality: N/A;   mass removal     back, forehead; benign (lipoma)    mass removal  2011   head; benign   POLYPECTOMY  10/13/2021   Procedure: POLYPECTOMY;  Surgeon: Napoleon Form, MD;  Location: [facility];  Service: Endoscopy;;   POSTERIOR CERVICAL FUSION/FORAMINOTOMY N/A 01/03/2018   Procedure: Posterior Cervical Fusion with lateral mass fixation - Cervical three - Cervical seven, cervical laminectomy Cervical three-cervical seven;  Surgeon: Tia Alert, MD;  Location: [facility] OR;  Service: Neurosurgery;  Laterality: N/A;   ROOT CANAL     s/p lipoma right scalp posteriorly  2011   ULNAR NERVE TRANSPOSITION Right 08/14/2022   Procedure: Right ulnar release;  Surgeon: Tia Alert, MD;  Location: [facility] OR;  Service: Neurosurgery;  Laterality: Right;   Patient Active Problem List   Diagnosis Date Noted   CHF (congestive heart failure) (HCC) 12/28/2022   S/P cervical spinal fusion 08/14/2022   Muscle atrophy 05/05/2022   COVID-19 virus infection 04/06/2022   Polyp of ascending colon    Polyp of cecum    Polyp of rectum    Degeneration of lumbar intervertebral disc 12/21/2020   Osteoarthritis 12/21/2020   Rheumatoid arthritis (HCC) 12/21/2020  Vitamin D deficiency 08/22/2019   Polyarthralgia 08/22/2019   Hypokalemia 08/22/2019   CKD (chronic kidney disease) stage 3, GFR 30-59 ml/min (HCC) 02/20/2019   Hand joint stiff, unspecified laterality 02/20/2019   S/P lumbar laminectomy 06/20/2018   Diastolic dysfunction 05/21/2018   Peripheral edema 05/17/2018   Dyspnea 05/17/2018   Cervical vertebral fusion 01/03/2018   Congenital spinal stenosis of lumbar region 08/22/2017   Bilateral hand pain 03/24/2017   Elevated blood pressure reading without diagnosis of hypertension 03/24/2017   Grief  reaction 01/02/2017   Nocturia 04/28/2016   Erectile dysfunction 04/28/2016   Chronic lumbar radiculopathy 05/20/2015   Increased prostate specific antigen (PSA) velocity 05/20/2015   Paresthesia of right leg 01/29/2015   Syncope 04/13/2014   EKG abnormality 04/13/2014   Family history of colon cancer 01/17/2013   Allergic rhinitis 09/10/2011   Microhematuria 09/08/2011   Impaired glucose tolerance 08/29/2011   Colon cancer screening 08/29/2011   Backache 10/31/2010   Toxic effect of chlorine gas 10/31/2010   WEAKNESS, RIGHT SIDE OF BODY 02/28/2010   HIP PAIN, RIGHT 12/02/2007   Pain in joint, lower leg 12/02/2007   Pain in Soft Tissues of Limb 12/02/2007   Hyperlipidemia 09/02/2007   Anxiety state 09/02/2007   PARESTHESIA 09/02/2007    PCP: Corwin Levins, MD  REFERRING PROVIDER: Corwin Levins, MD  REFERRING DIAG:  M54.9 (ICD-10-CM) - Bilateral back pain, unspecified back location, unspecified chronicity  M54.2 (ICD-10-CM) - Neck pain    Rationale for Evaluation and Treatment: Rehabilitation  THERAPY DIAG:  Muscle weakness (generalized)  Other abnormalities of gait and mobility  Unsteadiness on feet  Other low back pain  Right hand weakness  Cervicalgia  Radiculopathy, lumbar region  ONSET DATE: 06/21/23 (referral date)  SUBJECTIVE:                                                                                                                                                                                           SUBJECTIVE STATEMENT: Patient goes by "Matthew Tucker." Attended session with daughter.  Patient denies falls/near falls since last here. Patient is wanting to work on his yard this week as able. Patient reports that he has not taking any ibuprofen today.   PERTINENT HISTORY:  Anterior Cervical Decompression/discetomy and fusion of C2-C3 (12/23), R carpal tunnel release (12/23), R ulnar nerve release (12/23), lumbar laminectomy/decompression microdiscetomy  L1-L5 (2019), posterior cervical fusion/foraminotomy C3-C7 (2019), osteoarthritis of neck and bilateral hands, chronic back pain, cervical stenosis of spine, chronic lumbar radiculopathy, hyperlipidemia, CHF, CKD stage 3, hx of R foot surgery  PAIN:  Are you having pain?  4/10 in low back  and right hand Pt reports he cannot sit or walk for too long   PRECAUTIONS: Fall  RED FLAGS: None   WEIGHT BEARING RESTRICTIONS: No  FALLS:  Has patient fallen in last 6 months? No, but plenty of near misses   LIVING ENVIRONMENT: Lives with: lives with their spouse Lives in: House/apartment Stairs: Yes: Internal: 17 steps; on right going up, on left going up, and can reach both and External: 5 steps; on right going up, on left going up, and can reach both Has following equipment at home: Single point cane and Walker - 2 wheeled  OCCUPATION: Retired   PLOF: Independent  PATIENT GOALS: "I just want to work on every thing"    OBJECTIVE:  Note: Objective measures were completed at Evaluation unless otherwise noted.  DIAGNOSTIC FINDINGS:  No new imaging since operations on neck and low back  TODAY'S TREATMENT:           TherAct (Goals Check):                                                                                                                     PATIENT SURVEYS:  Modified Oswestry 19/50 = 38%    [facility] PT Assessment - 07/17/23 0001       Standardized Balance Assessment   Standardized Balance Assessment Five Times Sit to Stand    Five times sit to stand comments  22.91   seconds without UE use (SBA)     Functional Gait  Assessment   Gait assessed  Yes    Gait Level Surface Walks 20 ft in less than 7 sec but greater than 5.5 sec, uses assistive device, slower speed, mild gait deviations, or deviates 6-10 in outside of the 12 in walkway width.   6.7 seconds   Change in Gait Speed Able to smoothly change walking speed without loss of balance or gait deviation. Deviate no more than  6 in outside of the 12 in walkway width.    Gait with Horizontal Head Turns Performs head turns with moderate changes in gait velocity, slows down, deviates 10-15 in outside 12 in walkway width but recovers, can continue to walk.   instability noted and minor weaving   Gait with Vertical Head Turns Performs task with moderate change in gait velocity, slows down, deviates 10-15 in outside 12 in walkway width but recovers, can continue to walk.   instability noted and minor weaving   Gait and Pivot Turn Pivot turns safely in greater than 3 sec and stops with no loss of balance, or pivot turns safely within 3 sec and stops with mild imbalance, requires small steps to catch balance.   slow pace for safety   Step Over Obstacle Is able to step over one shoe box (4.5 in total height) without changing gait speed. No evidence of imbalance.   slows pace   Gait with Narrow Base of Support Ambulates 4-7 steps.   6 steps best out of 4 attempts   Gait with  Eyes Closed Walks 20 ft, slow speed, abnormal gait pattern, evidence for imbalance, deviates 10-15 in outside 12 in walkway width. Requires more than 9 sec to ambulate 20 ft.   large deviation   Ambulating Backwards Walks 20 ft, uses assistive device, slower speed, mild gait deviations, deviates 6-10 in outside 12 in walkway width.   mild deviation and slow pace   Steps Alternating feet, must use rail.   alt steps, uses rails   Total Score 17    FGA comment: 17/30 = high falls risk             TherEx (Initial HEP for safety): - Sit to Stand Without Arm Support - 10 reps - Tandem Walking with Counter Support  - 3 x 8 feet (discussed safety with patient and daughter for home with supervision)  [facility] PT Assessment - 07/17/23 0001       Standardized Balance Assessment   Standardized Balance Assessment Five Times Sit to Stand    Five times sit to stand comments  22.91   seconds without UE use (SBA)     Functional Gait  Assessment   Gait assessed  Yes     Gait Level Surface Walks 20 ft in less than 7 sec but greater than 5.5 sec, uses assistive device, slower speed, mild gait deviations, or deviates 6-10 in outside of the 12 in walkway width.   6.7 seconds   Change in Gait Speed Able to smoothly change walking speed without loss of balance or gait deviation. Deviate no more than 6 in outside of the 12 in walkway width.    Gait with Horizontal Head Turns Performs head turns with moderate changes in gait velocity, slows down, deviates 10-15 in outside 12 in walkway width but recovers, can continue to walk.   instability noted and minor weaving   Gait with Vertical Head Turns Performs task with moderate change in gait velocity, slows down, deviates 10-15 in outside 12 in walkway width but recovers, can continue to walk.   instability noted and minor weaving   Gait and Pivot Turn Pivot turns safely in greater than 3 sec and stops with no loss of balance, or pivot turns safely within 3 sec and stops with mild imbalance, requires small steps to catch balance.   slow pace for safety   Step Over Obstacle Is able to step over one shoe box (4.5 in total height) without changing gait speed. No evidence of imbalance.   slows pace   Gait with Narrow Base of Support Ambulates 4-7 steps.   6 steps best out of 4 attempts   Gait with Eyes Closed Walks 20 ft, slow speed, abnormal gait pattern, evidence for imbalance, deviates 10-15 in outside 12 in walkway width. Requires more than 9 sec to ambulate 20 ft.   large deviation   Ambulating Backwards Walks 20 ft, uses assistive device, slower speed, mild gait deviations, deviates 6-10 in outside 12 in walkway width.   mild deviation and slow pace   Steps Alternating feet, must use rail.   alt steps, uses rails   Total Score 17    FGA comment: 17/30 = high falls risk             Patient required intermittent redirection throughout session in order to stay on task for completion of activities during session   [facility] PT  Assessment - 07/17/23 0001       Standardized Balance Assessment   Standardized Balance Assessment Five Times Sit  to Stand    Five times sit to stand comments  22.91   seconds without UE use (SBA)     Functional Gait  Assessment   Gait assessed  Yes    Gait Level Surface Walks 20 ft in less than 7 sec but greater than 5.5 sec, uses assistive device, slower speed, mild gait deviations, or deviates 6-10 in outside of the 12 in walkway width.   6.7 seconds   Change in Gait Speed Able to smoothly change walking speed without loss of balance or gait deviation. Deviate no more than 6 in outside of the 12 in walkway width.    Gait with Horizontal Head Turns Performs head turns with moderate changes in gait velocity, slows down, deviates 10-15 in outside 12 in walkway width but recovers, can continue to walk.   instability noted and minor weaving   Gait with Vertical Head Turns Performs task with moderate change in gait velocity, slows down, deviates 10-15 in outside 12 in walkway width but recovers, can continue to walk.   instability noted and minor weaving   Gait and Pivot Turn Pivot turns safely in greater than 3 sec and stops with no loss of balance, or pivot turns safely within 3 sec and stops with mild imbalance, requires small steps to catch balance.   slow pace for safety   Step Over Obstacle Is able to step over one shoe box (4.5 in total height) without changing gait speed. No evidence of imbalance.   slows pace   Gait with Narrow Base of Support Ambulates 4-7 steps.   6 steps best out of 4 attempts   Gait with Eyes Closed Walks 20 ft, slow speed, abnormal gait pattern, evidence for imbalance, deviates 10-15 in outside 12 in walkway width. Requires more than 9 sec to ambulate 20 ft.   large deviation   Ambulating Backwards Walks 20 ft, uses assistive device, slower speed, mild gait deviations, deviates 6-10 in outside 12 in walkway width.   mild deviation and slow pace   Steps Alternating feet,  must use rail.   alt steps, uses rails   Total Score 17    FGA comment: 17/30 = high falls risk              PATIENT EDUCATION:  Education details: Initial HEP + testing results Person educated: Patient and Child(ren) Education method: Explanation and Demonstration Education comprehension: verbalized understanding  HOME EXERCISE PROGRAM: Access Code: [email or web address] URL: [email or web address] Date: 07/17/2023 Prepared by: Maryruth Eve  Exercises - Sit to Stand Without Arm Support  - 1 x daily - 7 x weekly - 3 sets - 10 reps - Tandem Walking with Counter Support  - 1 x daily - 7 x weekly - 3-4 sets  ASSESSMENT:  CLINICAL IMPRESSION: Skilled PT session emphasized assessment of modified Oswestry Disability Index, FGA, and FxSTS as well as initiation of HEP. Patient demonstrates with moderate disability as indicated by modified ODI score and is at an increased risk for falls as indicated by FGA and FxSTS results. Patient also presents with reduced functional LE strength as indicated by FxSTS results. Patient required intermittent redirection from daughter and therapist to stay on task during session but tolerated well and eager to participate. Continue POC to progress towards LTGs.  OBJECTIVE IMPAIRMENTS: Abnormal gait, decreased activity tolerance, decreased balance, decreased coordination, decreased endurance, decreased knowledge of condition, decreased knowledge of use of DME, decreased mobility, difficulty walking, decreased strength, impaired flexibility, impaired sensation, impaired UE functional use,  improper body mechanics, and pain  ACTIVITY LIMITATIONS: carrying, lifting, bending, sitting, standing, squatting, stairs, transfers, bathing, locomotion level, and caring for others  PARTICIPATION LIMITATIONS: meal prep, cleaning, laundry, driving, shopping, community activity, and yard work  PERSONAL FACTORS: Age, Fitness, Past/current experiences, and 1 comorbidity:  Lumbar laminectomy and C3-C7 fusion  are also affecting patient's functional outcome.    REHAB POTENTIAL: Good  CLINICAL DECISION MAKING: Stable/uncomplicated  EVALUATION COMPLEXITY: Low   GOALS: Goals reviewed with patient? Yes  SHORT TERM GOALS: Target date: 08/09/2023    Pt will be independent with initial HEP for improved strength, balance, transfers and gait.  Baseline: not established on eval  Goal status: INITIAL  2.  Patient will improve FGA to greater than 19/30 to indicate a decreased risk of falls and improved dynamic stability.   Baseline: 17/30 Goal status: INITIAL  3. Patient will improve their 5x Sit to Stand score to less than 20 seconds to demonstrate a decreased risk for falls and improved LE strength.   Baseline: 22.91 seconds without UE support Goal status: INITIAL  4.  Pt will be compliant w/walking program for improved endurance and safety w/gait  Baseline:  Goal status: INITIAL   LONG TERM GOALS: Target date: 09/06/2023    Pt will be independent with final HEP for improved strength, balance, transfers and gait.  Baseline:  Goal status: INITIAL  2.  Patient will improve FGA to greater than 22/30 to indicate a decreased risk of falls and improved dynamic stability.   Baseline: 17/30 Goal status: INITIAL  3.  Patient will improve modified ODI score to 28% impairment or less indicate a clinically important improvement in low back pain.   Baseline: 38% impairment  Goal status: INITIAL  4.  Patient will improve their 5x Sit to Stand score to less than 20 seconds to demonstrate a decreased risk for falls and improved LE strength.   Baseline: 22.91 seconds without UE support Goal status: INITIAL  5.  Pt will improve gait velocity to at least 3.2 ft/s  for improved gait efficiency and safety  Baseline: 2.93 ft/s  Goal status: INITIAL   PLAN:  PT FREQUENCY: 2x/week  PT DURATION: 8 weeks  PLANNED INTERVENTIONS: 97164- PT Re-evaluation,  97110-Therapeutic exercises, 97530- Therapeutic activity, 97112- Neuromuscular re-education, 97535- Self Care, [id number]- Manual therapy, [id number]- Gait training, [phone]- Aquatic Therapy, [phone]- Electrical stimulation (manual), Balance training, Stair training, Dry Needling, Joint mobilization, Spinal mobilization, and DME instructions.  PLAN FOR NEXT SESSION: Progress HEP for global strength and mobility (could trial dead lifts pending tolerance and work on hip hinge for back), work on dynamic stability challenges requiring modified SLS and directional turns, create waking program (consider [employer] - [employer] 6 week walking program), redirect as needed to keep patient on task     Carmelia Bake, PT, DPT 07/17/2023, 11:40 AM

## 2023-07-19 ENCOUNTER — Ambulatory Visit: Payer: Medicare Other | Admitting: Physical Therapy

## 2023-07-19 DIAGNOSIS — R2681 Unsteadiness on feet: Secondary | ICD-10-CM

## 2023-07-19 DIAGNOSIS — M6281 Muscle weakness (generalized): Secondary | ICD-10-CM | POA: Diagnosis not present

## 2023-07-19 DIAGNOSIS — M542 Cervicalgia: Secondary | ICD-10-CM | POA: Diagnosis not present

## 2023-07-19 DIAGNOSIS — M5459 Other low back pain: Secondary | ICD-10-CM | POA: Diagnosis not present

## 2023-07-19 DIAGNOSIS — R2689 Other abnormalities of gait and mobility: Secondary | ICD-10-CM | POA: Diagnosis not present

## 2023-07-19 DIAGNOSIS — R29898 Other symptoms and signs involving the musculoskeletal system: Secondary | ICD-10-CM | POA: Diagnosis not present

## 2023-07-19 NOTE — Therapy (Signed)
OUTPATIENT PHYSICAL THERAPY CERVICAL AND THORACOLUMBAR TREATMENT    Patient Name: Matthew Tucker MRN: [id number] DOB:Aug 31, 1951, 72 y.o., male Today's Date: 07/19/2023  END OF SESSION:  PT End of Session - 07/19/23 0934     Visit Number 3    Number of Visits 17    Date for PT Re-Evaluation 09/20/23    Authorization Type Medicare    Progress Note Due on Visit 10    PT Start Time 0933    PT Stop Time 1012    PT Time Calculation (min) 39 min    Equipment Utilized During Treatment --    Activity Tolerance Patient tolerated treatment well;Patient limited by fatigue    Behavior During Therapy [facility] for tasks assessed/performed              Past Medical History:  Diagnosis Date   Arthritis    Neck, bilateral hands   BACK PAIN, CHRONIC 10/31/2010   Cervical stenosis of spine    Chronic lumbar radiculopathy 05/20/2015   Diastolic dysfunction 05/21/2018   Family history of colon cancer 25-Jan-2013   Father died at 70yo   HYPERLIPIDEMIA 2007-09-10   Impaired glucose tolerance 08/29/2011   PARESTHESIA 09-10-2007   Toxic effect of chlorine gas(987.6) 10/31/2010   Past Surgical History:  Procedure Laterality Date   ANTERIOR CERVICAL DECOMP/DISCECTOMY FUSION N/A 08/14/2022   Procedure: ACDF - C2-C3;  Surgeon: Matthew Alert, MD;  Location: [facility] OR;  Service: Neurosurgery;  Laterality: N/A;   BIOPSY  10/13/2021   Procedure: BIOPSY;  Surgeon: Matthew Form, MD;  Location: [facility];  Service: Endoscopy;;   BUNIONECTOMY     CARPAL TUNNEL RELEASE Right 08/14/2022   Procedure: Right carpal tunnel release;  Surgeon: Matthew Alert, MD;  Location: [facility] OR;  Service: Neurosurgery;  Laterality: Right;   COLONOSCOPY     COLONOSCOPY WITH PROPOFOL N/A 10/13/2021   Procedure: COLONOSCOPY WITH PROPOFOL;  Surgeon: Matthew Form, MD;  Location: [facility];  Service: Endoscopy;  Laterality: N/A;   FOOT SURGERY Right    LUMBAR LAMINECTOMY/DECOMPRESSION MICRODISCECTOMY N/A 06/20/2018    Procedure: Laminectomy and Foraminotomy - Lumbar one-Lumbar two - Lumbar two-Lumbar three - Lumbar three-Lumbar four - Lumbar four-Lumbar five;  Surgeon: Matthew Alert, MD;  Location: [facility] OR;  Service: Neurosurgery;  Laterality: N/A;   mass removal     back, forehead; benign (lipoma)    mass removal  2011   head; benign   POLYPECTOMY  10/13/2021   Procedure: POLYPECTOMY;  Surgeon: Matthew Form, MD;  Location: [facility];  Service: Endoscopy;;   POSTERIOR CERVICAL FUSION/FORAMINOTOMY N/A 01/03/2018   Procedure: Posterior Cervical Fusion with lateral mass fixation - Cervical three - Cervical seven, cervical laminectomy Cervical three-cervical seven;  Surgeon: Matthew Alert, MD;  Location: [facility] OR;  Service: Neurosurgery;  Laterality: N/A;   ROOT CANAL     s/p lipoma right scalp posteriorly  2011   ULNAR NERVE TRANSPOSITION Right 08/14/2022   Procedure: Right ulnar release;  Surgeon: Matthew Alert, MD;  Location: [facility] OR;  Service: Neurosurgery;  Laterality: Right;   Patient Active Problem List   Diagnosis Date Noted   CHF (congestive heart failure) (HCC) 12/28/2022   S/P cervical spinal fusion 08/14/2022   Muscle atrophy 05/05/2022   COVID-19 virus infection 04/06/2022   Polyp of ascending colon    Polyp of cecum    Polyp of rectum    Degeneration of lumbar intervertebral disc 12/21/2020   Osteoarthritis 12/21/2020   Rheumatoid  arthritis (HCC) 12/21/2020   Vitamin D deficiency 08/22/2019   Polyarthralgia 08/22/2019   Hypokalemia 08/22/2019   CKD (chronic kidney disease) stage 3, GFR 30-59 ml/min (HCC) 02/20/2019   Hand joint stiff, unspecified laterality 02/20/2019   S/P lumbar laminectomy 06/20/2018   Diastolic dysfunction 05/21/2018   Peripheral edema 05/17/2018   Dyspnea 05/17/2018   Cervical vertebral fusion 01/03/2018   Congenital spinal stenosis of lumbar region 08/22/2017   Bilateral hand pain 03/24/2017   Elevated blood pressure reading without diagnosis of hypertension  03/24/2017   Grief reaction 01/02/2017   Nocturia 04/28/2016   Erectile dysfunction 04/28/2016   Chronic lumbar radiculopathy 05/20/2015   Increased prostate specific antigen (PSA) velocity 05/20/2015   Paresthesia of right leg 01/29/2015   Syncope 04/13/2014   EKG abnormality 04/13/2014   Family history of colon cancer 01/17/2013   Allergic rhinitis 09/10/2011   Microhematuria 09/08/2011   Impaired glucose tolerance 08/29/2011   Colon cancer screening 08/29/2011   Backache 10/31/2010   Toxic effect of chlorine gas 10/31/2010   WEAKNESS, RIGHT SIDE OF BODY 02/28/2010   HIP PAIN, RIGHT 12/02/2007   Pain in joint, lower leg 12/02/2007   Pain in Soft Tissues of Limb 12/02/2007   Hyperlipidemia 09/02/2007   Anxiety state 09/02/2007   PARESTHESIA 09/02/2007    PCP: Matthew Levins, MD  REFERRING PROVIDER: Corwin Levins, MD  REFERRING DIAG:  M54.9 (ICD-10-CM) - Bilateral back pain, unspecified back location, unspecified chronicity  M54.2 (ICD-10-CM) - Neck pain    Rationale for Evaluation and Treatment: Rehabilitation  THERAPY DIAG:  Muscle weakness (generalized)  Other abnormalities of gait and mobility  Unsteadiness on feet  ONSET DATE: 06/21/23 (referral date)  SUBJECTIVE:                                                                                                                                                                                           SUBJECTIVE STATEMENT: Patient goes by "Matthew Tucker." Attended session with daughter.  Patient denies falls/near falls since last here. States he is doing his homework and worked in the yard a bit since last session. Denies pain today.   PERTINENT HISTORY:  Anterior Cervical Decompression/discetomy and fusion of C2-C3 (12/23), R carpal tunnel release (12/23), R ulnar nerve release (12/23), lumbar laminectomy/decompression microdiscetomy L1-L5 (2019), posterior cervical fusion/foraminotomy C3-C7 (2019), osteoarthritis of neck  and bilateral hands, chronic back pain, cervical stenosis of spine, chronic lumbar radiculopathy, hyperlipidemia, CHF, CKD stage 3, hx of R foot surgery  PAIN:  Are you having pain?  no Pt reports he cannot sit or walk for too long   PRECAUTIONS: Fall  RED  FLAGS: None   WEIGHT BEARING RESTRICTIONS: No  FALLS:  Has patient fallen in last 6 months? No, but plenty of near misses   LIVING ENVIRONMENT: Lives with: lives with their spouse Lives in: House/apartment Stairs: Yes: Internal: 17 steps; on right going up, on left going up, and can reach both and External: 5 steps; on right going up, on left going up, and can reach both Has following equipment at home: Single point cane and Walker - 2 wheeled  OCCUPATION: Retired   PLOF: Independent  PATIENT GOALS: "I just want to work on every thing"    OBJECTIVE:  Note: Objective measures were completed at Evaluation unless otherwise noted.  DIAGNOSTIC FINDINGS:  No new imaging since operations on neck and low back  TODAY'S TREATMENT:          Ther Ex  SciFit multi-peaks level 6 for 8 minutes using BUE/BLEs for neural priming for reciprocal movement, dynamic cardiovascular warmup and increased amplitude of stepping. Min cues to maintain steps/min >75 throughout. RPE of 8/10 following activity.  In // bars, lateral/fwd/reto monster walks w/green theraband around distal quads, x30' each direction for improved functional hip strength and facilitation of wide BOS. Mod multimodal cues to maintain abduction of BLEs throughout. Noted frequent LOB to R side w/fwd/retro stepping requiring min A to stabilize. Added to HEP (see bolded below) In // bars, standing hip abduction/extension w/BUE support and green band around ankles, x10 per side and direction. Mod verbal cues to reduce truncal lean compensation w/movement. Reduced mobility noted on RLE > LLE. Added to HEP (see bolded below)  Seated DL w/green resistance band, 2x12 reps, for  introduction to proper lifting technique and posterior chain strength. Min cues to avoid pulling up w/arms and to use glutes instead.   RPE of 9/10 following session. "I have not done this much exercise since 2019"  PATIENT EDUCATION:  Education details: additions to HEP  Person educated: Patient Education method: Explanation, Demonstration, Verbal cues, and Handouts Education comprehension: verbalized understanding, returned demonstration, verbal cues required, and needs further education  HOME EXERCISE PROGRAM: Access Code: [email or web address] URL: [email or web address] Date: 07/17/2023 Prepared by: Maryruth Eve  Exercises - Sit to Stand Without Arm Support  - 1 x daily - 7 x weekly - 3 sets - 10 reps - Tandem Walking with Counter Support  - 1 x daily - 7 x weekly - 3-4 sets - Side Stepping with Resistance at Thighs and Counter Support  - 1 x daily - 7 x weekly - 3 sets - 10 reps - Forward Backward Monster Walk with Band at Thighs and Counter Support  - 1 x daily - 7 x weekly - 3 sets - 10 reps - Standing Hip Extension with Resistance at Ankles and Counter Support  - 1 x daily - 7 x weekly - 3 sets - 10 reps - Standing Hip Abduction with Resistance at Ankles and Counter Support  - 1 x daily - 7 x weekly - 3 sets - 10 reps  ASSESSMENT:  CLINICAL IMPRESSION: Emphasis of skilled PT session on functional hip strength, endurance and posterior chain strength. Pt limited by fatigue this date, stating he "has not worked out this hard since 2019". Pt demonstrates significant R hip weakness, resulting in scissoring of gait and lateral instability to R side. Added to HEP to address hip weakness w/emphasis on performing w/BUE support, as pt did lose balance a few times to R side, requiring UE support to avoid fall. Continue POC.  OBJECTIVE IMPAIRMENTS: Abnormal gait, decreased activity tolerance, decreased balance, decreased coordination, decreased endurance, decreased knowledge of condition,  decreased knowledge of use of DME, decreased mobility, difficulty walking, decreased strength, impaired flexibility, impaired sensation, impaired UE functional use, improper body mechanics, and pain  ACTIVITY LIMITATIONS: carrying, lifting, bending, sitting, standing, squatting, stairs, transfers, bathing, locomotion level, and caring for others  PARTICIPATION LIMITATIONS: meal prep, cleaning, laundry, driving, shopping, community activity, and yard work  PERSONAL FACTORS: Age, Fitness, Past/current experiences, and 1 comorbidity: Lumbar laminectomy and C3-C7 fusion  are also affecting patient's functional outcome.    REHAB POTENTIAL: Good  CLINICAL DECISION MAKING: Stable/uncomplicated  EVALUATION COMPLEXITY: Low   GOALS: Goals reviewed with patient? Yes  SHORT TERM GOALS: Target date: 08/09/2023    Pt will be independent with initial HEP for improved strength, balance, transfers and gait.  Baseline: not established on eval  Goal status: INITIAL  2.  Patient will improve FGA to greater than 19/30 to indicate a decreased risk of falls and improved dynamic stability.   Baseline: 17/30 Goal status: INITIAL  3. Patient will improve their 5x Sit to Stand score to less than 20 seconds to demonstrate a decreased risk for falls and improved LE strength.   Baseline: 22.91 seconds without UE support Goal status: INITIAL  4.  Pt will be compliant w/walking program for improved endurance and safety w/gait  Baseline:  Goal status: INITIAL   LONG TERM GOALS: Target date: 09/06/2023    Pt will be independent with final HEP for improved strength, balance, transfers and gait.  Baseline:  Goal status: INITIAL  2.  Patient will improve FGA to greater than 22/30 to indicate a decreased risk of falls and improved dynamic stability.   Baseline: 17/30 Goal status: INITIAL  3.  Patient will improve modified ODI score to 28% impairment or less indicate a clinically important improvement  in low back pain.   Baseline: 38% impairment  Goal status: INITIAL  4.  Patient will improve their 5x Sit to Stand score to less than 20 seconds to demonstrate a decreased risk for falls and improved LE strength.   Baseline: 22.91 seconds without UE support Goal status: INITIAL  5.  Pt will improve gait velocity to at least 3.2 ft/s  for improved gait efficiency and safety  Baseline: 2.93 ft/s  Goal status: INITIAL   PLAN:  PT FREQUENCY: 2x/week  PT DURATION: 8 weeks  PLANNED INTERVENTIONS: 97164- PT Re-evaluation, 97110-Therapeutic exercises, 97530- Therapeutic activity, 97112- Neuromuscular re-education, 97535- Self Care, [id number]- Manual therapy, [id number]- Gait training, [phone]- Aquatic Therapy, [phone]- Electrical stimulation (manual), Balance training, Stair training, Dry Needling, Joint mobilization, Spinal mobilization, and DME instructions.  PLAN FOR NEXT SESSION: Progress HEP for global strength and mobility (could trial dead lifts pending tolerance and work on hip hinge for back), work on dynamic stability challenges requiring modified SLS and directional turns, create waking program (consider [employer] - [employer] 6 week walking program), redirect as needed to keep patient on task , hip strength   Kelie Gainey E Laneice Meneely, PT, DPT 07/19/2023, 10:21 AM

## 2023-07-23 ENCOUNTER — Ambulatory Visit: Payer: Medicare Other | Admitting: Physical Therapy

## 2023-07-23 ENCOUNTER — Encounter: Payer: Self-pay | Admitting: Physical Therapy

## 2023-07-23 VITALS — BP 154/92 | HR 75

## 2023-07-23 DIAGNOSIS — M542 Cervicalgia: Secondary | ICD-10-CM | POA: Diagnosis not present

## 2023-07-23 DIAGNOSIS — R2689 Other abnormalities of gait and mobility: Secondary | ICD-10-CM | POA: Diagnosis not present

## 2023-07-23 DIAGNOSIS — R2681 Unsteadiness on feet: Secondary | ICD-10-CM

## 2023-07-23 DIAGNOSIS — M5459 Other low back pain: Secondary | ICD-10-CM | POA: Diagnosis not present

## 2023-07-23 DIAGNOSIS — M6281 Muscle weakness (generalized): Secondary | ICD-10-CM

## 2023-07-23 DIAGNOSIS — R29898 Other symptoms and signs involving the musculoskeletal system: Secondary | ICD-10-CM | POA: Diagnosis not present

## 2023-07-23 NOTE — Therapy (Signed)
OUTPATIENT PHYSICAL THERAPY CERVICAL AND THORACOLUMBAR TREATMENT    Patient Name: Matthew Tucker MRN: [id number] DOB:May 12, 1951, 72 y.o., male Today's Date: 07/23/2023  END OF SESSION:  PT End of Session - 07/23/23 0851     Visit Number 4    Number of Visits 17    Date for PT Re-Evaluation 09/20/23    Authorization Type Medicare    Progress Note Due on Visit 10    PT Start Time 0850    PT Stop Time 0933    PT Time Calculation (min) 43 min    Equipment Utilized During Treatment Gait belt    Activity Tolerance Patient tolerated treatment well;Patient limited by fatigue    Behavior During Therapy WFL for tasks assessed/performed             Past Medical History:  Diagnosis Date   Arthritis    Neck, bilateral hands   BACK PAIN, CHRONIC 10/31/2010   Cervical stenosis of spine    Chronic lumbar radiculopathy 05/20/2015   Diastolic dysfunction 05/21/2018   Family history of colon cancer 01-27-13   Father died at 70yo   HYPERLIPIDEMIA 09/12/07   Impaired glucose tolerance 08/29/2011   PARESTHESIA 09/12/07   Toxic effect of chlorine gas(987.6) 10/31/2010   Past Surgical History:  Procedure Laterality Date   ANTERIOR CERVICAL DECOMP/DISCECTOMY FUSION N/A 08/14/2022   Procedure: ACDF - C2-C3;  Surgeon: Tia Alert, MD;  Location[facility] OR;  Service: Neurosurgery;  Laterality: N/A;   BIOPSY  10/13/2021   Procedure: BIOPSY;  Surgeon: Napoleon Form, MD;  Location: [facility];  Service: Endoscopy;;   BUNIONECTOMY     CARPAL TUNNEL RELEASE Right 08/14/2022   Procedure: Right carpal tunnel release;  Surgeon: Tia Alert, MD;  Location: [facility] OR;  Service: Neurosurgery;  Laterality: Right;   COLONOSCOPY     COLONOSCOPY WITH PROPOFOL N/A 10/13/2021   Procedure: COLONOSCOPY WITH PROPOFOL;  Surgeon: Napoleon Form, MD;  Location: [facility];  Service: Endoscopy;  Laterality: N/A;   FOOT SURGERY Right    LUMBAR LAMINECTOMY/DECOMPRESSION MICRODISCECTOMY N/A 06/20/2018    Procedure: Laminectomy and Foraminotomy - Lumbar one-Lumbar two - Lumbar two-Lumbar three - Lumbar three-Lumbar four - Lumbar four-Lumbar five;  Surgeon: Tia Alert, MD;  Location: [facility] OR;  Service: Neurosurgery;  Laterality: N/A;   mass removal     back, forehead; benign (lipoma)    mass removal  2011   head; benign   POLYPECTOMY  10/13/2021   Procedure: POLYPECTOMY;  Surgeon: Napoleon Form, MD;  Location: [facility];  Service: Endoscopy;;   POSTERIOR CERVICAL FUSION/FORAMINOTOMY N/A 01/03/2018   Procedure: Posterior Cervical Fusion with lateral mass fixation - Cervical three - Cervical seven, cervical laminectomy Cervical three-cervical seven;  Surgeon: Tia Alert, MD;  Location: [facility] OR;  Service: Neurosurgery;  Laterality: N/A;   ROOT CANAL     s/p lipoma right scalp posteriorly  2011   ULNAR NERVE TRANSPOSITION Right 08/14/2022   Procedure: Right ulnar release;  Surgeon: Tia Alert, MD;  Location: [facility] OR;  Service: Neurosurgery;  Laterality: Right;   Patient Active Problem List   Diagnosis Date Noted   CHF (congestive heart failure) (HCC) 12/28/2022   S/P cervical spinal fusion 08/14/2022   Muscle atrophy 05/05/2022   COVID-19 virus infection 04/06/2022   Polyp of ascending colon    Polyp of cecum    Polyp of rectum    Degeneration of lumbar intervertebral disc 12/21/2020   Osteoarthritis 12/21/2020   Rheumatoid  arthritis (HCC) 12/21/2020   Vitamin D deficiency 08/22/2019   Polyarthralgia 08/22/2019   Hypokalemia 08/22/2019   CKD (chronic kidney disease) stage 3, GFR 30-59 ml/min (HCC) 02/20/2019   Hand joint stiff, unspecified laterality 02/20/2019   S/P lumbar laminectomy 06/20/2018   Diastolic dysfunction 05/21/2018   Peripheral edema 05/17/2018   Dyspnea 05/17/2018   Cervical vertebral fusion 01/03/2018   Congenital spinal stenosis of lumbar region 08/22/2017   Bilateral hand pain 03/24/2017   Elevated blood pressure reading without diagnosis of hypertension  03/24/2017   Grief reaction 01/02/2017   Nocturia 04/28/2016   Erectile dysfunction 04/28/2016   Chronic lumbar radiculopathy 05/20/2015   Increased prostate specific antigen (PSA) velocity 05/20/2015   Paresthesia of right leg 01/29/2015   Syncope 04/13/2014   EKG abnormality 04/13/2014   Family history of colon cancer 01/17/2013   Allergic rhinitis 09/10/2011   Microhematuria 09/08/2011   Impaired glucose tolerance 08/29/2011   Colon cancer screening 08/29/2011   Backache 10/31/2010   Toxic effect of chlorine gas 10/31/2010   WEAKNESS, RIGHT SIDE OF BODY 02/28/2010   HIP PAIN, RIGHT 12/02/2007   Pain in joint, lower leg 12/02/2007   Pain in Soft Tissues of Limb 12/02/2007   Hyperlipidemia 09/02/2007   Anxiety state 09/02/2007   PARESTHESIA 09/02/2007    PCP: Corwin Levins, MD  REFERRING PROVIDER: Corwin Levins, MD  REFERRING DIAG:  M54.9 (ICD-10-CM) - Bilateral back pain, unspecified back location, unspecified chronicity  M54.2 (ICD-10-CM) - Neck pain    Rationale for Evaluation and Treatment: Rehabilitation  THERAPY DIAG:  Muscle weakness (generalized)  Other abnormalities of gait and mobility  Unsteadiness on feet  Other low back pain  ONSET DATE: 06/21/23 (referral date)  SUBJECTIVE:                                                                                                                                                                                           SUBJECTIVE STATEMENT: Patient goes by "[name]." Attended session with daughter who waits in waiting room.  Patient denies falls/near falls since last here. Reports some muscle soreness in glutes and legs after last session but is doing well. Reports no new questions about exercises at this time.   PERTINENT HISTORY:  Anterior Cervical Decompression/discetomy and fusion of C2-C3 (12/23), R carpal tunnel release (12/23), R ulnar nerve release (12/23), lumbar laminectomy/decompression microdiscetomy  L1-L5 (2019), posterior cervical fusion/foraminotomy C3-C7 (2019), osteoarthritis of neck and bilateral hands, chronic back pain, cervical stenosis of spine, chronic lumbar radiculopathy, hyperlipidemia, CHF, CKD stage 3, hx of R foot surgery  PAIN:  Are you having pain?  no Pt  reports he cannot sit or walk for too long   PRECAUTIONS: Fall  RED FLAGS: None   WEIGHT BEARING RESTRICTIONS: No  FALLS:  Has patient fallen in last 6 months? No, but plenty of near misses   LIVING ENVIRONMENT: Lives with: lives with their spouse Lives in: House/apartment Stairs: Yes: Internal: 17 steps; on right going up, on left going up, and can reach both and External: 5 steps; on right going up, on left going up, and can reach both Has following equipment at home: Single point cane and Walker - 2 wheeled  OCCUPATION: Retired   PLOF: Independent  PATIENT GOALS: "I just want to work on every thing"    OBJECTIVE:  Note: Objective measures were completed at Evaluation unless otherwise noted.  DIAGNOSTIC FINDINGS:  No new imaging since operations on neck and low back  TODAY'S TREATMENT:          TherAct: Vitals:   07/23/23 0854  BP: (!) 154/92  Pulse: 75   TherAct:  Assessed vitals seated on RUE at rest; BP elevated in today's session but WNL for therapy. Patient not on BP medications at this time per patient. Provided patient with BP log to begin tracking BP 2x a day for monitoring and encourage follow up with PCP for management. Patient verbalized understanding.  Ther Ex  SciFit twin hill setting level 6 for 6 minutes using BUE/BLEs for neural priming for reciprocal movement, dynamic cardiovascular warmup and increased amplitude of stepping. Min cues to maintain steps/min >75 progressed to > 80 spm with patient able to maintain throughout. RPE of 8/10 following activity in today's session as well.   In // bars, with front hold of 25lb kettle bell with CGA with 4" box step ups (Required  prolonged rest break after each set, modified time to help reduce RPE) Set 1: 1 x 60 seconds; RPE: 10/10 Set 2: 1 x  40 seconds; RPE: 10/10  Set 3: 1 x 20 seconds; RPE: 8/10 (required minA 1x due to not fully picking up RLE)   NMR: In // bars, EC balance on foam 3 x 7-10 second trials, 2 x 30 seconds on final trials (SBA with minA on first attempts due to lateral LOB)  In // bars, with front hold of 12lb kettle bell with CGA with lateral step down return on foam  2 x 10; RPE: 6/10 for both (required minA 1x due to LE fatigue on final set)  PATIENT EDUCATION:  Education details: Continue HEP + BP log + walking program Person educated: Patient Education method: Explanation, Demonstration, Verbal cues, and Handouts Education comprehension: verbalized understanding, returned demonstration, verbal cues required, and needs further education  HOME EXERCISE PROGRAM: Access Code: [email or web address] URL: [email or web address] Date: 07/17/2023 Prepared by: Maryruth Eve  Exercises - Sit to Stand Without Arm Support  - 1 x daily - 7 x weekly - 3 sets - 10 reps - Tandem Walking with Counter Support  - 1 x daily - 7 x weekly - 3-4 sets - Side Stepping with Resistance at Thighs and Counter Support  - 1 x daily - 7 x weekly - 3 sets - 10 reps - Forward Backward Monster Walk with Band at Thighs and Counter Support  - 1 x daily - 7 x weekly - 3 sets - 10 reps - Standing Hip Extension with Resistance at Ankles and Counter Support  - 1 x daily - 7 x weekly - 3 sets - 10 reps - Standing Hip Abduction with Resistance at  Ankles and Counter Support  - 1 x daily - 7 x weekly - 3 sets - 10 reps  BP log and [employer] 6 week beginner walking program  ASSESSMENT:  CLINICAL IMPRESSION: Emphasis of skilled PT session on functional strength with proximal stability tasks, high level cardiovascular training and resistance training, and dynamic and status balance. Patient  reported RPE 10/10 with step up tasks with weight; modified intensity for tolerance with target goal of RPE 8/10. Initiated BP log as patient BP elevated during today's session. Continue POC.   OBJECTIVE IMPAIRMENTS: Abnormal gait, decreased activity tolerance, decreased balance, decreased coordination, decreased endurance, decreased knowledge of condition, decreased knowledge of use of DME, decreased mobility, difficulty walking, decreased strength, impaired flexibility, impaired sensation, impaired UE functional use, improper body mechanics, and pain  ACTIVITY LIMITATIONS: carrying, lifting, bending, sitting, standing, squatting, stairs, transfers, bathing, locomotion level, and caring for others  PARTICIPATION LIMITATIONS: meal prep, cleaning, laundry, driving, shopping, community activity, and yard work  PERSONAL FACTORS: Age, Fitness, Past/current experiences, and 1 comorbidity: Lumbar laminectomy and C3-C7 fusion  are also affecting patient's functional outcome.    REHAB POTENTIAL: Good  CLINICAL DECISION MAKING: Stable/uncomplicated  EVALUATION COMPLEXITY: Low   GOALS: Goals reviewed with patient? Yes  SHORT TERM GOALS: Target date: 08/09/2023    Pt will be independent with initial HEP for improved strength, balance, transfers and gait.  Baseline: not established on eval  Goal status: INITIAL  2.  Patient will improve FGA to greater than 19/30 to indicate a decreased risk of falls and improved dynamic stability.   Baseline: 17/30 Goal status: INITIAL  3. Patient will improve their 5x Sit to Stand score to less than 20 seconds to demonstrate a decreased risk for falls and improved LE strength.   Baseline: 22.91 seconds without UE support Goal status: INITIAL  4.  Pt will be compliant w/walking program for improved endurance and safety w/gait  Baseline:  Goal status: INITIAL   LONG TERM GOALS: Target date: 09/06/2023    Pt will be independent with final HEP for  improved strength, balance, transfers and gait.  Baseline:  Goal status: INITIAL  2.  Patient will improve FGA to greater than 22/30 to indicate a decreased risk of falls and improved dynamic stability.   Baseline: 17/30 Goal status: INITIAL  3.  Patient will improve modified ODI score to 28% impairment or less indicate a clinically important improvement in low back pain.   Baseline: 38% impairment  Goal status: INITIAL  4.  Patient will improve their 5x Sit to Stand score to less than 20 seconds to demonstrate a decreased risk for falls and improved LE strength.   Baseline: 22.91 seconds without UE support Goal status: INITIAL  5.  Pt will improve gait velocity to at least 3.2 ft/s  for improved gait efficiency and safety  Baseline: 2.93 ft/s  Goal status: INITIAL   PLAN:  PT FREQUENCY: 2x/week  PT DURATION: 8 weeks  PLANNED INTERVENTIONS: 97164- PT Re-evaluation, 97110-Therapeutic exercises, 97530- Therapeutic activity, 97112- Neuromuscular re-education, 97535- Self Care, 16109- Manual therapy, [id number]- Gait training, [phone]- Aquatic Therapy, [id number]- Electrical stimulation (manual), Balance training, Stair training, Dry Needling, Joint mobilization, Spinal mobilization, and DME instructions.  PLAN FOR NEXT SESSION: Progress HEP for global strength and mobility (could trial dead lifts pending tolerance and work on hip hinge for back), work on dynamic stability challenges requiring modified SLS and directional turns, review waking program, redirect as needed to keep patient on task ,  hip strength   Carmelia Bake, PT, DPT 07/23/2023, 12:10 PM

## 2023-07-27 ENCOUNTER — Encounter: Payer: Self-pay | Admitting: Physical Therapy

## 2023-07-27 ENCOUNTER — Ambulatory Visit: Payer: Medicare Other | Admitting: Physical Therapy

## 2023-07-27 VITALS — BP 145/89 | HR 75

## 2023-07-27 DIAGNOSIS — R2681 Unsteadiness on feet: Secondary | ICD-10-CM | POA: Diagnosis not present

## 2023-07-27 DIAGNOSIS — R29898 Other symptoms and signs involving the musculoskeletal system: Secondary | ICD-10-CM | POA: Diagnosis not present

## 2023-07-27 DIAGNOSIS — R2689 Other abnormalities of gait and mobility: Secondary | ICD-10-CM | POA: Diagnosis not present

## 2023-07-27 DIAGNOSIS — M542 Cervicalgia: Secondary | ICD-10-CM | POA: Diagnosis not present

## 2023-07-27 DIAGNOSIS — M6281 Muscle weakness (generalized): Secondary | ICD-10-CM | POA: Diagnosis not present

## 2023-07-27 DIAGNOSIS — M5459 Other low back pain: Secondary | ICD-10-CM

## 2023-07-27 NOTE — Therapy (Signed)
OUTPATIENT PHYSICAL THERAPY CERVICAL AND THORACOLUMBAR TREATMENT    Patient Name: Matthew Tucker MRN: [id number] DOB:12/01/1950, 72 y.o., male Today's Date: 07/27/2023  END OF SESSION:  PT End of Session - 07/27/23 0937     Visit Number 5    Number of Visits 17    Date for PT Re-Evaluation 09/20/23    Authorization Type Medicare    Progress Note Due on Visit 10    PT Start Time 0931    PT Stop Time 1014    PT Time Calculation (min) 43 min    Equipment Utilized During Treatment Gait belt    Activity Tolerance Patient tolerated treatment well;Patient limited by fatigue    Behavior During Therapy WFL for tasks assessed/performed             Past Medical History:  Diagnosis Date   Arthritis    Neck, bilateral hands   BACK PAIN, CHRONIC 10/31/2010   Cervical stenosis of spine    Chronic lumbar radiculopathy 05/20/2015   Diastolic dysfunction 05/21/2018   Family history of colon cancer 02-02-13   Father died at 70yo   HYPERLIPIDEMIA 2007-09-18   Impaired glucose tolerance 08/29/2011   PARESTHESIA September 18, 2007   Toxic effect of chlorine gas(987.6) 10/31/2010   Past Surgical History:  Procedure Laterality Date   ANTERIOR CERVICAL DECOMP/DISCECTOMY FUSION N/A 08/14/2022   Procedure: ACDF - C2-C3;  Surgeon: Tia Alert, MD;  Location: [facility] OR;  Service: Neurosurgery;  Laterality: N/A;   BIOPSY  10/13/2021   Procedure: BIOPSY;  Surgeon: Napoleon Form, MD;  Location: [facility];  Service: Endoscopy;;   BUNIONECTOMY     CARPAL TUNNEL RELEASE Right 08/14/2022   Procedure: Right carpal tunnel release;  Surgeon: Tia Alert, MD;  Location: [facility] OR;  Service: Neurosurgery;  Laterality: Right;   COLONOSCOPY     COLONOSCOPY WITH PROPOFOL N/A 10/13/2021   Procedure: COLONOSCOPY WITH PROPOFOL;  Surgeon: Napoleon Form, MD;  Location: [facility];  Service: Endoscopy;  Laterality: N/A;   FOOT SURGERY Right    LUMBAR LAMINECTOMY/DECOMPRESSION MICRODISCECTOMY N/A 06/20/2018    Procedure: Laminectomy and Foraminotomy - Lumbar one-Lumbar two - Lumbar two-Lumbar three - Lumbar three-Lumbar four - Lumbar four-Lumbar five;  Surgeon: Tia Alert, MD;  Location: [name] OR;  Service: Neurosurgery;  Laterality: N/A;   mass removal     back, forehead; benign (lipoma)    mass removal  2011   head; benign   POLYPECTOMY  10/13/2021   Procedure: POLYPECTOMY;  Surgeon: Napoleon Form, MD;  Location: [facility];  Service: Endoscopy;;   POSTERIOR CERVICAL FUSION/FORAMINOTOMY N/A 01/03/2018   Procedure: Posterior Cervical Fusion with lateral mass fixation - Cervical three - Cervical seven, cervical laminectomy Cervical three-cervical seven;  Surgeon: Tia Alert, MD;  Location: [facility] OR;  Service: Neurosurgery;  Laterality: N/A;   ROOT CANAL     s/p lipoma right scalp posteriorly  2011   ULNAR NERVE TRANSPOSITION Right 08/14/2022   Procedure: Right ulnar release;  Surgeon: Tia Alert, MD;  Location: [facility] OR;  Service: Neurosurgery;  Laterality: Right;   Patient Active Problem List   Diagnosis Date Noted   CHF (congestive heart failure) (HCC) 12/28/2022   S/P cervical spinal fusion 08/14/2022   Muscle atrophy 05/05/2022   COVID-19 virus infection 04/06/2022   Polyp of ascending colon    Polyp of cecum    Polyp of rectum    Degeneration of lumbar intervertebral disc 12/21/2020   Osteoarthritis 12/21/2020   Rheumatoid  arthritis (HCC) 12/21/2020   Vitamin D deficiency 08/22/2019   Polyarthralgia 08/22/2019   Hypokalemia 08/22/2019   CKD (chronic kidney disease) stage 3, GFR 30-59 ml/min (HCC) 02/20/2019   Hand joint stiff, unspecified laterality 02/20/2019   S/P lumbar laminectomy 06/20/2018   Diastolic dysfunction 05/21/2018   Peripheral edema 05/17/2018   Dyspnea 05/17/2018   Cervical vertebral fusion 01/03/2018   Congenital spinal stenosis of lumbar region 08/22/2017   Bilateral hand pain 03/24/2017   Elevated blood pressure reading without diagnosis of hypertension  03/24/2017   Grief reaction 01/02/2017   Nocturia 04/28/2016   Erectile dysfunction 04/28/2016   Chronic lumbar radiculopathy 05/20/2015   Increased prostate specific antigen (PSA) velocity 05/20/2015   Paresthesia of right leg 01/29/2015   Syncope 04/13/2014   EKG abnormality 04/13/2014   Family history of colon cancer 01/17/2013   Allergic rhinitis 09/10/2011   Microhematuria 09/08/2011   Impaired glucose tolerance 08/29/2011   Colon cancer screening 08/29/2011   Backache 10/31/2010   Toxic effect of chlorine gas 10/31/2010   WEAKNESS, RIGHT SIDE OF BODY 02/28/2010   HIP PAIN, RIGHT 12/02/2007   Pain in joint, lower leg 12/02/2007   Pain in Soft Tissues of Limb 12/02/2007   Hyperlipidemia 09/02/2007   Anxiety state 09/02/2007   PARESTHESIA 09/02/2007    PCP: Corwin Levins, MD  REFERRING PROVIDER: Corwin Levins, MD  REFERRING DIAG:  M54.9 (ICD-10-CM) - Bilateral back pain, unspecified back location, unspecified chronicity  M54.2 (ICD-10-CM) - Neck pain    Rationale for Evaluation and Treatment: Rehabilitation  THERAPY DIAG:  Muscle weakness (generalized)  Other abnormalities of gait and mobility  Unsteadiness on feet  Other low back pain  ONSET DATE: 06/21/23 (referral date)  SUBJECTIVE:                                                                                                                                                                                           SUBJECTIVE STATEMENT: Patient goes by "Matthew Tucker." Attended session with daughter who waits in waiting room.  Patient arrives to session with BP log. Highest and lowest reading over last 5 days noted below. Patient denies falls/near falls. Reports some muscle soreness after last session with 25lb weight in mid weight but pain has since subsided.   160/86 mmHg, 58 bpm (07-23-2023) 129/85 mmHg, 85 bpm (07-27-2023)   PERTINENT HISTORY:  Anterior Cervical Decompression/discetomy and fusion of C2-C3  (12/23), R carpal tunnel release (12/23), R ulnar nerve release (12/23), lumbar laminectomy/decompression microdiscetomy L1-L5 (2019), posterior cervical fusion/foraminotomy C3-C7 (2019), osteoarthritis of neck and bilateral hands, chronic back pain, cervical stenosis of spine, chronic lumbar radiculopathy,  hyperlipidemia, CHF, CKD stage 3, hx of R foot surgery  PAIN:  Are you having pain?  no Pt reports he cannot sit or walk for too long   PRECAUTIONS: Fall  RED FLAGS: None   WEIGHT BEARING RESTRICTIONS: No  FALLS:  Has patient fallen in last 6 months? No, but plenty of near misses   LIVING ENVIRONMENT: Lives with: lives with their spouse Lives in: House/apartment Stairs: Yes: Internal: 17 steps; on right going up, on left going up, and can reach both and External: 5 steps; on right going up, on left going up, and can reach both Has following equipment at home: Single point cane and Walker - 2 wheeled  OCCUPATION: Retired   PLOF: Independent  PATIENT GOALS: "I just want to work on every thing"    OBJECTIVE:  Note: Objective measures were completed at Evaluation unless otherwise noted.  DIAGNOSTIC FINDINGS:  No new imaging since operations on neck and low back  TODAY'S TREATMENT:          TherAct: Vitals:   07/27/23 0940  BP: (!) 145/89  Pulse: 75   NMR: Dynamic warmup with CGA 2 X 20 feet of each for dynamic balance Heel walking Toe walking High knees (scissoring notable with patient using hallway wall intermittently to stabilize) Glute kicks Kick outs Mat level activities with bench in tall/half kneel for proximal stability  (CGA) Tall kneel to half kneel with UE support 2 x 5  Tall kneel with hip hinge without UE support 2 x 10 Tall kneel with horizontal shoulder abdcution with gtb 2 x 10   TherEx: Hip hinge working on unweighted dead lift  2 x 10 (required max visual and verbal cues to prevent compensation from lumbar spine, improved unweighted) Trialed  with 15lb kettle bell x 8 reps but unable to achieve correct form with weight so returned to unweighted - may revisit in future session   PATIENT EDUCATION:  Education details: Continue HEP + BP log + follow up with PCP  Person educated: Patient Education method: Programmer, multimedia, Demonstration, Verbal cues, and Handouts Education comprehension: verbalized understanding, returned demonstration, verbal cues required, and needs further education  HOME EXERCISE PROGRAM: Access Code: [email or web address] URL: [email or web address] Date: 07/17/2023 Prepared by: Maryruth Eve  Exercises - Sit to Stand Without Arm Support  - 1 x daily - 7 x weekly - 3 sets - 10 reps - Tandem Walking with Counter Support  - 1 x daily - 7 x weekly - 3-4 sets - Side Stepping with Resistance at Thighs and Counter Support  - 1 x daily - 7 x weekly - 3 sets - 10 reps - Forward Backward Monster Walk with Band at [employer] and Counter Support  - 1 x daily - 7 x weekly - 3 sets - 10 reps - Standing Hip Extension with Resistance at Ankles and Counter Support  - 1 x daily - 7 x weekly - 3 sets - 10 reps - Standing Hip Abduction with Resistance at Ankles and Counter Support  - 1 x daily - 7 x weekly - 3 sets - 10 reps  BP log and [employer] 6 week beginner walking program  ASSESSMENT:  CLINICAL IMPRESSION: Emphasis of skilled PT session on functional movement with strong emphasis on hip hinge for proximal stability. Patient demosntrates inability to achieve hip hinge with weight in today's session but improved form when unweighted and with tall kneel modification. Patient continues to demonstrate some scissoring particularly with high knees during  dynamic warmup. Continue POC.   OBJECTIVE IMPAIRMENTS: Abnormal gait, decreased activity tolerance, decreased balance, decreased coordination, decreased endurance, decreased knowledge of condition, decreased knowledge of use of DME, decreased mobility,  difficulty walking, decreased strength, impaired flexibility, impaired sensation, impaired UE functional use, improper body mechanics, and pain  ACTIVITY LIMITATIONS: carrying, lifting, bending, sitting, standing, squatting, stairs, transfers, bathing, locomotion level, and caring for others  PARTICIPATION LIMITATIONS: meal prep, cleaning, laundry, driving, shopping, community activity, and yard work  PERSONAL FACTORS: Age, Fitness, Past/current experiences, and 1 comorbidity: Lumbar laminectomy and C3-C7 fusion  are also affecting patient's functional outcome.    REHAB POTENTIAL: Good  CLINICAL DECISION MAKING: Stable/uncomplicated  EVALUATION COMPLEXITY: Low   GOALS: Goals reviewed with patient? Yes  SHORT TERM GOALS: Target date: 08/09/2023    Pt will be independent with initial HEP for improved strength, balance, transfers and gait.  Baseline: not established on eval  Goal status: INITIAL  2.  Patient will improve FGA to greater than 19/30 to indicate a decreased risk of falls and improved dynamic stability.   Baseline: 17/30 Goal status: INITIAL  3. Patient will improve their 5x Sit to Stand score to less than 20 seconds to demonstrate a decreased risk for falls and improved LE strength.   Baseline: 22.91 seconds without UE support Goal status: INITIAL  4.  Pt will be compliant w/walking program for improved endurance and safety w/gait  Baseline:  Goal status: INITIAL   LONG TERM GOALS: Target date: 09/06/2023    Pt will be independent with final HEP for improved strength, balance, transfers and gait.  Baseline:  Goal status: INITIAL  2.  Patient will improve FGA to greater than 22/30 to indicate a decreased risk of falls and improved dynamic stability.   Baseline: 17/30 Goal status: INITIAL  3.  Patient will improve modified ODI score to 28% impairment or less indicate a clinically important improvement in low back pain.   Baseline: 38% impairment  Goal  status: INITIAL  4.  Patient will improve their 5x Sit to Stand score to less than 20 seconds to demonstrate a decreased risk for falls and improved LE strength.   Baseline: 22.91 seconds without UE support Goal status: INITIAL  5.  Pt will improve gait velocity to at least 3.2 ft/s  for improved gait efficiency and safety  Baseline: 2.93 ft/s  Goal status: INITIAL   PLAN:  PT FREQUENCY: 2x/week  PT DURATION: 8 weeks  PLANNED INTERVENTIONS: 97164- PT Re-evaluation, 97110-Therapeutic exercises, 97530- Therapeutic activity, 97112- Neuromuscular re-education, 97535- Self Care, 93818- Manual therapy, [id number]- Gait training, [phone]- Aquatic Therapy, [phone]- Electrical stimulation (manual), Balance training, Stair training, Dry Needling, Joint mobilization, Spinal mobilization, and DME instructions.  PLAN FOR NEXT SESSION: Progress HEP for global strength and mobility (reinforce dead lift technique and trial again with weights as tolerated), work on dynamic stability challenges requiring modified SLS and directional turns, review waking program, redirect as needed to keep patient on task , hip strength   Carmelia Bake, PT, DPT 07/27/2023, 11:19 AM

## 2023-07-31 ENCOUNTER — Ambulatory Visit: Payer: Medicare Other | Admitting: Physical Therapy

## 2023-07-31 ENCOUNTER — Encounter: Payer: Self-pay | Admitting: Physical Therapy

## 2023-07-31 VITALS — BP 150/85 | HR 75

## 2023-07-31 DIAGNOSIS — R2681 Unsteadiness on feet: Secondary | ICD-10-CM | POA: Diagnosis not present

## 2023-07-31 DIAGNOSIS — R29898 Other symptoms and signs involving the musculoskeletal system: Secondary | ICD-10-CM | POA: Diagnosis not present

## 2023-07-31 DIAGNOSIS — M542 Cervicalgia: Secondary | ICD-10-CM | POA: Diagnosis not present

## 2023-07-31 DIAGNOSIS — R2689 Other abnormalities of gait and mobility: Secondary | ICD-10-CM

## 2023-07-31 DIAGNOSIS — M5459 Other low back pain: Secondary | ICD-10-CM

## 2023-07-31 DIAGNOSIS — M6281 Muscle weakness (generalized): Secondary | ICD-10-CM

## 2023-07-31 NOTE — Therapy (Signed)
OUTPATIENT PHYSICAL THERAPY CERVICAL AND THORACOLUMBAR TREATMENT    Patient Name: Matthew Tucker MRN: [id number] DOB:[birth year], 72 y.o., male Today's Date: 07/31/2023  END OF SESSION:  PT End of Session - 07/31/23 0935     Visit Number 6    Number of Visits 17    Date for PT Re-Evaluation 09/20/23    Authorization Type Medicare    Progress Note Due on Visit 10    PT Start Time 0935    PT Stop Time 1016    PT Time Calculation (min) 41 min    Equipment Utilized During Treatment Gait belt    Activity Tolerance Patient tolerated treatment well    Behavior During Therapy WFL for tasks assessed/performed             Past Medical History:  Diagnosis Date   Arthritis    Neck, bilateral hands   BACK PAIN, CHRONIC 10/31/2010   Cervical stenosis of spine    Chronic lumbar radiculopathy 05/20/2015   Diastolic dysfunction 05/21/2018   Family history of colon cancer 02-13-13   Father died at 70yo   HYPERLIPIDEMIA 09/29/2007   Impaired glucose tolerance 08/29/2011   PARESTHESIA 2007/09/29   Toxic effect of chlorine gas(987.6) 10/31/2010   Past Surgical History:  Procedure Laterality Date   ANTERIOR CERVICAL DECOMP/DISCECTOMY FUSION N/A 08/14/2022   Procedure: ACDF - C2-C3;  Surgeon: Tia Alert, MD;  Location: [facility] OR;  Service: Neurosurgery;  Laterality: N/A;   BIOPSY  10/13/2021   Procedure: BIOPSY;  Surgeon: Napoleon Form, MD;  Location: [facility];  Service: Endoscopy;;   BUNIONECTOMY     CARPAL TUNNEL RELEASE Right 08/14/2022   Procedure: Right carpal tunnel release;  Surgeon: Tia Alert, MD;  Location: [facility] OR;  Service: Neurosurgery;  Laterality: Right;   COLONOSCOPY     COLONOSCOPY WITH PROPOFOL N/A 10/13/2021   Procedure: COLONOSCOPY WITH PROPOFOL;  Surgeon: Napoleon Form, MD;  Location: [facility];  Service: Endoscopy;  Laterality: N/A;   FOOT SURGERY Right    LUMBAR LAMINECTOMY/DECOMPRESSION MICRODISCECTOMY N/A 06/20/2018   Procedure: Laminectomy and  Foraminotomy - Lumbar one-Lumbar two - Lumbar two-Lumbar three - Lumbar three-Lumbar four - Lumbar four-Lumbar five;  Surgeon: Tia Alert, MD;  Location: [facility] OR;  Service: Neurosurgery;  Laterality: N/A;   mass removal     back, forehead; benign (lipoma)    mass removal  2011   head; benign   POLYPECTOMY  10/13/2021   Procedure: POLYPECTOMY;  Surgeon: Napoleon Form, MD;  Location: [facility];  Service: Endoscopy;;   POSTERIOR CERVICAL FUSION/FORAMINOTOMY N/A 01/03/2018   Procedure: Posterior Cervical Fusion with lateral mass fixation - Cervical three - Cervical seven, cervical laminectomy Cervical three-cervical seven;  Surgeon: Tia Alert, MD;  Location: [facility] OR;  Service: Neurosurgery;  Laterality: N/A;   ROOT CANAL     s/p lipoma right scalp posteriorly  2011   ULNAR NERVE TRANSPOSITION Right 08/14/2022   Procedure: Right ulnar release;  Surgeon: Tia Alert, MD;  Location: [facility] OR;  Service: Neurosurgery;  Laterality: Right;   Patient Active Problem List   Diagnosis Date Noted   CHF (congestive heart failure) (HCC) 12/28/2022   S/P cervical spinal fusion 08/14/2022   Muscle atrophy 05/05/2022   COVID-19 virus infection 04/06/2022   Polyp of ascending colon    Polyp of cecum    Polyp of rectum    Degeneration of lumbar intervertebral disc 12/21/2020   Osteoarthritis 12/21/2020   Rheumatoid arthritis (HCC) 12/21/2020  Vitamin D deficiency 08/22/2019   Polyarthralgia 08/22/2019   Hypokalemia 08/22/2019   CKD (chronic kidney disease) stage 3, GFR 30-59 ml/min (HCC) 02/20/2019   Hand joint stiff, unspecified laterality 02/20/2019   S/P lumbar laminectomy 06/20/2018   Diastolic dysfunction 05/21/2018   Peripheral edema 05/17/2018   Dyspnea 05/17/2018   Cervical vertebral fusion 01/03/2018   Congenital spinal stenosis of lumbar region 08/22/2017   Bilateral hand pain 03/24/2017   Elevated blood pressure reading without diagnosis of hypertension 03/24/2017   Grief  reaction 01/02/2017   Nocturia 04/28/2016   Erectile dysfunction 04/28/2016   Chronic lumbar radiculopathy 05/20/2015   Increased prostate specific antigen (PSA) velocity 05/20/2015   Paresthesia of right leg 01/29/2015   Syncope 04/13/2014   EKG abnormality 04/13/2014   Family history of colon cancer 01/17/2013   Allergic rhinitis 09/10/2011   Microhematuria 09/08/2011   Impaired glucose tolerance 08/29/2011   Colon cancer screening 08/29/2011   Backache 10/31/2010   Toxic effect of chlorine gas 10/31/2010   WEAKNESS, RIGHT SIDE OF BODY 02/28/2010   HIP PAIN, RIGHT 12/02/2007   Pain in joint, lower leg 12/02/2007   Pain in Soft Tissues of Limb 12/02/2007   Hyperlipidemia 09/02/2007   Anxiety state 09/02/2007   PARESTHESIA 09/02/2007    PCP: Corwin Levins, MD  REFERRING PROVIDER: Corwin Levins, MD  REFERRING DIAG:  M54.9 (ICD-10-CM) - Bilateral back pain, unspecified back location, unspecified chronicity  M54.2 (ICD-10-CM) - Neck pain    Rationale for Evaluation and Treatment: Rehabilitation  THERAPY DIAG:  Muscle weakness (generalized)  Other abnormalities of gait and mobility  Unsteadiness on feet  Other low back pain  ONSET DATE: 06/21/23 (referral date)  SUBJECTIVE:                                                                                                                                                                                           SUBJECTIVE STATEMENT: Patient goes by "[name]." Attended session with daughter who waits in waiting room.  Patient reports that exercises at home are going okay; patient reports that he has not too much walking. Denies falls/near falls. Patient reports that BP has been elevated 152/89 mmHg and 146/75 mmHg. Called PCP office but was not able to get in yet.   PERTINENT HISTORY:  Anterior Cervical Decompression/discetomy and fusion of C2-C3 (12/23), R carpal tunnel release (12/23), R ulnar nerve release (12/23), lumbar  laminectomy/decompression microdiscetomy L1-L5 (2019), posterior cervical fusion/foraminotomy C3-C7 (2019), osteoarthritis of neck and bilateral hands, chronic back pain, cervical stenosis of spine, chronic lumbar radiculopathy, hyperlipidemia, CHF, CKD stage 3, hx of R foot surgery  PAIN:  Are you having pain?  no Pt reports he cannot sit or walk for too long   PRECAUTIONS: Fall  RED FLAGS: None   WEIGHT BEARING RESTRICTIONS: No  FALLS:  Has patient fallen in last 6 months? No, but plenty of near misses   LIVING ENVIRONMENT: Lives with: lives with their spouse Lives in: House/apartment Stairs: Yes: Internal: 17 steps; on right going up, on left going up, and can reach both and External: 5 steps; on right going up, on left going up, and can reach both Has following equipment at home: Single point cane and Walker - 2 wheeled  OCCUPATION: Retired   PLOF: Independent  PATIENT GOALS: "I just want to work on every thing"   OBJECTIVE:  Note: Objective measures were completed at Evaluation unless otherwise noted.  DIAGNOSTIC FINDINGS:  No new imaging since operations on neck and low back  TODAY'S TREATMENT:          Vitals:   07/31/23 0941  BP: (!) 150/85  Pulse: 75  Seated on LUE at rest; elevated but WNL for therpay, patent continues BP log and plans to follow up with PCP  TherEx: SciFit level 6 x 8 min on sprint setting with bil UE/LE use for reciporcal movement pattern, gentle strengthening, and cardiopulmonary conditioning - cues for max effort on sprint segment with recovery on lower grade intervals between; requires min cues for pacing RPE: 8/10 Sumo squat with chest hold with bilateral UE of 10lb kettle bell 1 x 10 reps (SBA)   NMR: Squigz placement on mirror with stride stance with front LE on mirror with trunk rotation and reach with opposite UE 1 x 8 on each side for modified single leg stance stability Required seated breaks between each rep, RPE: 9/10 Squigz  placement on mirror with step back return onto BOSU round side up with opposite LE and UE placement for stability challenge, stepping reaction, and modified single leg stance stability  RPE: 8-9/10, requires seated break after competition   PATIENT EDATION:  Education details: Continue HEP Person educated: Patient Education method: Programmer, multimedia, Facilities manager, Verbal cues, and Handouts Education comprehension: verbalized understanding, returned demonstration, verbal cues required, and needs further education  HOME EXERCISE PROGRAM: Access Code: [email or web address] URL: [email or web address] Date: 07/17/2023 Prepared by: Maryruth Eve  Exercises - Sit to Stand Without Arm Support  - 1 x daily - 7 x weekly - 3 sets - 10 reps - Tandem Walking with Counter Support  - 1 x daily - 7 x weekly - 3-4 sets - Side Stepping with Resistance at Thighs and Counter Support  - 1 x daily - 7 x weekly - 3 sets - 10 reps - Forward Backward Monster Walk with Band at [employer] and Counter Support  - 1 x daily - 7 x weekly - 3 sets - 10 reps - Standing Hip Extension with Resistance at Ankles and Counter Support  - 1 x daily - 7 x weekly - 3 sets - 10 reps - Standing Hip Abduction with Resistance at Ankles and Counter Support  - 1 x daily - 7 x weekly - 3 sets - 10 reps  BP log and [employer] 6 week beginner walking program  ASSESSMENT:  CLINICAL IMPRESSION: Emphasis of skilled PT session on progressing strength and stability in stride stance with various tasks. Patient reports at end of SciFit warmup that he feels like his endurance is getting better. Patient does require seated breaks between each exercise after warmup and requires cues  for pacing with activity. Squat form improved in today's session with better use of hip hinge than prior sessions. Patient presents with flexed posture and intermittent crouch gait throughout the session with ability to correct mildly with cues.  Continue POC.   OBJECTIVE IMPAIRMENTS: Abnormal gait, decreased activity tolerance, decreased balance, decreased coordination, decreased endurance, decreased knowledge of condition, decreased knowledge of use of DME, decreased mobility, difficulty walking, decreased strength, impaired flexibility, impaired sensation, impaired UE functional use, improper body mechanics, and pain  ACTIVITY LIMITATIONS: carrying, lifting, bending, sitting, standing, squatting, stairs, transfers, bathing, locomotion level, and caring for others  PARTICIPATION LIMITATIONS: meal prep, cleaning, laundry, driving, shopping, community activity, and yard work  PERSONAL FACTORS: Age, Fitness, Past/current experiences, and 1 comorbidity: Lumbar laminectomy and C3-C7 fusion  are also affecting patient's functional outcome.    REHAB POTENTIAL: Good  CLINICAL DECISION MAKING: Stable/uncomplicated  EVALUATION COMPLEXITY: Low   GOALS: Goals reviewed with patient? Yes  SHORT TERM GOALS: Target date: 08/09/2023    Pt will be independent with initial HEP for improved strength, balance, transfers and gait.  Baseline: not established on eval  Goal status: INITIAL  2.  Patient will improve FGA to greater than 19/30 to indicate a decreased risk of falls and improved dynamic stability.   Baseline: 17/30 Goal status: INITIAL  3. Patient will improve their 5x Sit to Stand score to less than 20 seconds to demonstrate a decreased risk for falls and improved LE strength.   Baseline: 22.91 seconds without UE support Goal status: INITIAL  4.  Pt will be compliant w/walking program for improved endurance and safety w/gait  Baseline:  Goal status: INITIAL   LONG TERM GOALS: Target date: 09/06/2023    Pt will be independent with final HEP for improved strength, balance, transfers and gait.  Baseline:  Goal status: INITIAL  2.  Patient will improve FGA to greater than 22/30 to indicate a decreased risk of falls and  improved dynamic stability.   Baseline: 17/30 Goal status: INITIAL  3.  Patient will improve modified ODI score to 28% impairment or less indicate a clinically important improvement in low back pain.   Baseline: 38% impairment  Goal status: INITIAL  4.  Patient will improve their 5x Sit to Stand score to less than 20 seconds to demonstrate a decreased risk for falls and improved LE strength.   Baseline: 22.91 seconds without UE support Goal status: INITIAL  5.  Pt will improve gait velocity to at least 3.2 ft/s  for improved gait efficiency and safety  Baseline: 2.93 ft/s  Goal status: INITIAL   PLAN:  PT FREQUENCY: 2x/week  PT DURATION: 8 weeks  PLANNED INTERVENTIONS: 97164- PT Re-evaluation, 97110-Therapeutic exercises, 97530- Therapeutic activity, 97112- Neuromuscular re-education, 97535- Self Care, [id number]- Manual therapy, [id number]- Gait training, [phone]- Aquatic Therapy, [phone]- Electrical stimulation (manual), Balance training, Stair training, Dry Needling, Joint mobilization, Spinal mobilization, and DME instructions.  PLAN FOR NEXT SESSION: Progress HEP for global strength and mobility (reinforce dead lift technique and trial again with weights as tolerated), work on dynamic stability challenges requiring modified SLS and directional turns, review waking program, redirect as needed to keep patient on task , hip strength, schedule more through DEC?   Carmelia Bake, PT, DPT 07/31/2023, 10:27 AM

## 2023-08-02 ENCOUNTER — Ambulatory Visit: Payer: Medicare Other | Admitting: Physical Therapy

## 2023-08-02 VITALS — BP 131/88 | HR 64

## 2023-08-02 DIAGNOSIS — M6281 Muscle weakness (generalized): Secondary | ICD-10-CM | POA: Diagnosis not present

## 2023-08-02 DIAGNOSIS — R2681 Unsteadiness on feet: Secondary | ICD-10-CM | POA: Diagnosis not present

## 2023-08-02 DIAGNOSIS — R2689 Other abnormalities of gait and mobility: Secondary | ICD-10-CM

## 2023-08-02 DIAGNOSIS — M5459 Other low back pain: Secondary | ICD-10-CM | POA: Diagnosis not present

## 2023-08-02 DIAGNOSIS — R29898 Other symptoms and signs involving the musculoskeletal system: Secondary | ICD-10-CM | POA: Diagnosis not present

## 2023-08-02 DIAGNOSIS — M542 Cervicalgia: Secondary | ICD-10-CM | POA: Diagnosis not present

## 2023-08-02 NOTE — Therapy (Addendum)
OUTPATIENT PHYSICAL THERAPY CERVICAL AND THORACOLUMBAR TREATMENT    Patient Name: Matthew Tucker MRN: [id number] DOB:[birth year], 72 y.o., male Today's Date: 08/02/2023  END OF SESSION:  PT End of Session - 08/02/23 1020     Visit Number 7    Number of Visits 17    Date for PT Re-Evaluation 09/20/23    Authorization Type Medicare    Progress Note Due on Visit 10    PT Start Time 1018    PT Stop Time 1057    PT Time Calculation (min) 39 min    Equipment Utilized During Treatment Gait belt    Activity Tolerance Patient tolerated treatment well    Behavior During Therapy WFL for tasks assessed/performed             Past Medical History:  Diagnosis Date   Arthritis    Neck, bilateral hands   BACK PAIN, CHRONIC 10/31/2010   Cervical stenosis of spine    Chronic lumbar radiculopathy 05/20/2015   Diastolic dysfunction 05/21/2018   Family history of colon cancer 02-04-2013   Father died at 70yo   HYPERLIPIDEMIA 2007/09/20   Impaired glucose tolerance 08/29/2011   PARESTHESIA 20-Sep-2007   Toxic effect of chlorine gas(987.6) 10/31/2010   Past Surgical History:  Procedure Laterality Date   ANTERIOR CERVICAL DECOMP/DISCECTOMY FUSION N/A 08/14/2022   Procedure: ACDF - C2-C3;  Surgeon: Tia Alert, MD;  Location: [facility] OR;  Service: Neurosurgery;  Laterality: N/A;   BIOPSY  10/13/2021   Procedure: BIOPSY;  Surgeon: Napoleon Form, MD;  Location: [facility];  Service: Endoscopy;;   BUNIONECTOMY     CARPAL TUNNEL RELEASE Right 08/14/2022   Procedure: Right carpal tunnel release;  Surgeon: Tia Alert, MD;  Location: [facility] OR;  Service: Neurosurgery;  Laterality: Right;   COLONOSCOPY     COLONOSCOPY WITH PROPOFOL N/A 10/13/2021   Procedure: COLONOSCOPY WITH PROPOFOL;  Surgeon: Napoleon Form, MD;  Location: [facility];  Service: Endoscopy;  Laterality: N/A;   FOOT SURGERY Right    LUMBAR LAMINECTOMY/DECOMPRESSION MICRODISCECTOMY N/A 06/20/2018   Procedure: Laminectomy and  Foraminotomy - Lumbar one-Lumbar two - Lumbar two-Lumbar three - Lumbar three-Lumbar four - Lumbar four-Lumbar five;  Surgeon: Tia Alert, MD;  Location: [facility] OR;  Service: Neurosurgery;  Laterality: N/A;   mass removal     back, forehead; benign (lipoma)    mass removal  2011   head; benign   POLYPECTOMY  10/13/2021   Procedure: POLYPECTOMY;  Surgeon: Napoleon Form, MD;  Location: [facility];  Service: Endoscopy;;   POSTERIOR CERVICAL FUSION/FORAMINOTOMY N/A 01/03/2018   Procedure: Posterior Cervical Fusion with lateral mass fixation - Cervical three - Cervical seven, cervical laminectomy Cervical three-cervical seven;  Surgeon: Tia Alert, MD;  Location: [facility] OR;  Service: Neurosurgery;  Laterality: N/A;   ROOT CANAL     s/p lipoma right scalp posteriorly  2011   ULNAR NERVE TRANSPOSITION Right 08/14/2022   Procedure: Right ulnar release;  Surgeon: Tia Alert, MD;  Location: [facility] OR;  Service: Neurosurgery;  Laterality: Right;   Patient Active Problem List   Diagnosis Date Noted   CHF (congestive heart failure) (HCC) 12/28/2022   S/P cervical spinal fusion 08/14/2022   Muscle atrophy 05/05/2022   COVID-19 virus infection 04/06/2022   Polyp of ascending colon    Polyp of cecum    Polyp of rectum    Degeneration of lumbar intervertebral disc 12/21/2020   Osteoarthritis 12/21/2020   Rheumatoid arthritis (HCC) 12/21/2020  Vitamin D deficiency 08/22/2019   Polyarthralgia 08/22/2019   Hypokalemia 08/22/2019   CKD (chronic kidney disease) stage 3, GFR 30-59 ml/min (HCC) 02/20/2019   Hand joint stiff, unspecified laterality 02/20/2019   S/P lumbar laminectomy 06/20/2018   Diastolic dysfunction 05/21/2018   Peripheral edema 05/17/2018   Dyspnea 05/17/2018   Cervical vertebral fusion 01/03/2018   Congenital spinal stenosis of lumbar region 08/22/2017   Bilateral hand pain 03/24/2017   Elevated blood pressure reading without diagnosis of hypertension 03/24/2017   Grief  reaction 01/02/2017   Nocturia 04/28/2016   Erectile dysfunction 04/28/2016   Chronic lumbar radiculopathy 05/20/2015   Increased prostate specific antigen (PSA) velocity 05/20/2015   Paresthesia of right leg 01/29/2015   Syncope 04/13/2014   EKG abnormality 04/13/2014   Family history of colon cancer 01/17/2013   Allergic rhinitis 09/10/2011   Microhematuria 09/08/2011   Impaired glucose tolerance 08/29/2011   Colon cancer screening 08/29/2011   Backache 10/31/2010   Toxic effect of chlorine gas 10/31/2010   WEAKNESS, RIGHT SIDE OF BODY 02/28/2010   HIP PAIN, RIGHT 12/02/2007   Pain in joint, lower leg 12/02/2007   Pain in Soft Tissues of Limb 12/02/2007   Hyperlipidemia 09/02/2007   Anxiety state 09/02/2007   PARESTHESIA 09/02/2007    PCP: Corwin Levins, MD  REFERRING PROVIDER: Corwin Levins, MD  REFERRING DIAG:  M54.9 (ICD-10-CM) - Bilateral back pain, unspecified back location, unspecified chronicity  M54.2 (ICD-10-CM) - Neck pain    Rationale for Evaluation and Treatment: Rehabilitation  THERAPY DIAG:  Muscle weakness (generalized)  Other abnormalities of gait and mobility  Other low back pain  ONSET DATE: 06/21/23 (referral date)  SUBJECTIVE:                                                                                                                                                                                           SUBJECTIVE STATEMENT: Patient goes by "[name]." Attended session with daughter who waits in waiting room.  Patient reports that exercises at home are going okay. Has been working on his exercises at home. Denies falls or acute changes. Wanting to work on stretching today.   PERTINENT HISTORY:  Anterior Cervical Decompression/discetomy and fusion of C2-C3 (12/23), R carpal tunnel release (12/23), R ulnar nerve release (12/23), lumbar laminectomy/decompression microdiscetomy L1-L5 (2019), posterior cervical fusion/foraminotomy C3-C7 (2019),  osteoarthritis of neck and bilateral hands, chronic back pain, cervical stenosis of spine, chronic lumbar radiculopathy, hyperlipidemia, CHF, CKD stage 3, hx of R foot surgery  PAIN:  Are you having pain?  no Pt reports he cannot sit or walk for too long   PRECAUTIONS:  Fall  RED FLAGS: None   WEIGHT BEARING RESTRICTIONS: No  FALLS:  Has patient fallen in last 6 months? No, but plenty of near misses   LIVING ENVIRONMENT: Lives with: lives with their spouse Lives in: House/apartment Stairs: Yes: Internal: 17 steps; on right going up, on left going up, and can reach both and External: 5 steps; on right going up, on left going up, and can reach both Has following equipment at home: Single point cane and Walker - 2 wheeled  OCCUPATION: Retired   PLOF: Independent  PATIENT GOALS: "I just want to work on every thing"   OBJECTIVE:  Note: Objective measures were completed at Evaluation unless otherwise noted.  DIAGNOSTIC FINDINGS:  No new imaging since operations on neck and low back  VITALS  Vitals:   08/02/23 1025  BP: 131/88  Pulse: 64     TODAY'S TREATMENT:          Ther Act  Assessed vitals (see above) and WNL for therapy   TherEx: SciFit multi-peaks level 9 for 8 minutes using BUE/BLEs for neural priming for reciprocal movement, dynamic cardiovascular warmup and global strength. RPE of 6/10 following activity  The following were performed for improved spinal mobility per pt request:  Attempted iron crosses, x1 per side, but too painful for pt.  Regressed to LTRs, x4 per side, for gentle spinal mobility instead.  Supine figure-4 stretch, 3x30s per side.  Double knee to chest stretch, x2 minutes  PPT, x15 reps  Quadruped thread the needles, x8 per side  PVC goodmornings w/rotation at bottom, x6 reps. Max verbal cues for proper form as pt rounding back rather than facilitate hip hinge.   RPE of 8/10 following session     PATIENT EDATION:  Education details:  Continue HEP Person educated: Patient Education method: Explanation, Demonstration, Verbal cues, and Handouts Education comprehension: verbalized understanding, returned demonstration, verbal cues required, and needs further education  HOME EXERCISE PROGRAM: Access Code: [email or web address] URL: [email or web address] Date: 07/17/2023 Prepared by: Maryruth Eve  Exercises - Sit to Stand Without Arm Support  - 1 x daily - 7 x weekly - 3 sets - 10 reps - Tandem Walking with Counter Support  - 1 x daily - 7 x weekly - 3-4 sets - Side Stepping with Resistance at Thighs and Counter Support  - 1 x daily - 7 x weekly - 3 sets - 10 reps - Forward Backward Monster Walk with Band at Thighs and Counter Support  - 1 x daily - 7 x weekly - 3 sets - 10 reps - Standing Hip Extension with Resistance at Ankles and Counter Support  - 1 x daily - 7 x weekly - 3 sets - 10 reps - Standing Hip Abduction with Resistance at Ankles and Counter Support  - 1 x daily - 7 x weekly - 3 sets - 10 reps  BP log and [employer] 6 week beginner walking program  ASSESSMENT:  CLINICAL IMPRESSION: Emphasis of skilled PT session on endurance and functional mobility. Pt requesting to stretch his back today, so performed series of supine/quadruped/standing mobility exercises which pt reported felt good. Pt most challenged by spinal rotation but denied pain w/movement. Pt continues to require max cues to properly facilitate hip hinge, as he tends to round his back. Continue POC.   OBJECTIVE IMPAIRMENTS: Abnormal gait, decreased activity tolerance, decreased balance, decreased coordination, decreased endurance, decreased knowledge of condition, decreased knowledge of use of DME, decreased mobility, difficulty walking, decreased strength,  impaired flexibility, impaired sensation, impaired UE functional use, improper body mechanics, and pain  ACTIVITY LIMITATIONS: carrying, lifting, bending,  sitting, standing, squatting, stairs, transfers, bathing, locomotion level, and caring for others  PARTICIPATION LIMITATIONS: meal prep, cleaning, laundry, driving, shopping, community activity, and yard work  PERSONAL FACTORS: Age, Fitness, Past/current experiences, and 1 comorbidity: Lumbar laminectomy and C3-C7 fusion  are also affecting patient's functional outcome.    REHAB POTENTIAL: Good  CLINICAL DECISION MAKING: Stable/uncomplicated  EVALUATION COMPLEXITY: Low   GOALS: Goals reviewed with patient? Yes  SHORT TERM GOALS: Target date: 08/09/2023    Pt will be independent with initial HEP for improved strength, balance, transfers and gait.  Baseline: not established on eval  Goal status: INITIAL  2.  Patient will improve FGA to greater than 19/30 to indicate a decreased risk of falls and improved dynamic stability.   Baseline: 17/30 Goal status: INITIAL  3. Patient will improve their 5x Sit to Stand score to less than 20 seconds to demonstrate a decreased risk for falls and improved LE strength.   Baseline: 22.91 seconds without UE support Goal status: INITIAL  4.  Pt will be compliant w/walking program for improved endurance and safety w/gait  Baseline:  Goal status: INITIAL   LONG TERM GOALS: Target date: 09/06/2023    Pt will be independent with final HEP for improved strength, balance, transfers and gait.  Baseline:  Goal status: INITIAL  2.  Patient will improve FGA to greater than 22/30 to indicate a decreased risk of falls and improved dynamic stability.   Baseline: 17/30 Goal status: INITIAL  3.  Patient will improve modified ODI score to 28% impairment or less indicate a clinically important improvement in low back pain.   Baseline: 38% impairment  Goal status: INITIAL  4.  Patient will improve their 5x Sit to Stand score to less than 20 seconds to demonstrate a decreased risk for falls and improved LE strength.   Baseline: 22.91 seconds without  UE support Goal status: INITIAL  5.  Pt will improve gait velocity to at least 3.2 ft/s  for improved gait efficiency and safety  Baseline: 2.93 ft/s  Goal status: INITIAL   PLAN:  PT FREQUENCY: 2x/week  PT DURATION: 8 weeks  PLANNED INTERVENTIONS: 97164- PT Re-evaluation, 97110-Therapeutic exercises, 97530- Therapeutic activity, 97112- Neuromuscular re-education, 97535- Self Care, 40347- Manual therapy, [id number]- Gait training, [phone]- Aquatic Therapy, [phone]- Electrical stimulation (manual), Balance training, Stair training, Dry Needling, Joint mobilization, Spinal mobilization, and DME instructions.  PLAN FOR NEXT SESSION: Progress HEP for global strength and mobility (reinforce dead lift technique and trial again with weights as tolerated), work on dynamic stability challenges requiring modified SLS and directional turns, review waking program, redirect as needed to keep patient on task , hip strength   Naevia Unterreiner E Shaunte Weissinger, PT, DPT 08/02/2023, 11:16 AM

## 2023-08-06 ENCOUNTER — Ambulatory Visit: Payer: Medicare Other | Admitting: Physical Therapy

## 2023-08-06 VITALS — BP 126/86 | HR 96

## 2023-08-06 DIAGNOSIS — M6281 Muscle weakness (generalized): Secondary | ICD-10-CM | POA: Diagnosis not present

## 2023-08-06 DIAGNOSIS — M542 Cervicalgia: Secondary | ICD-10-CM | POA: Diagnosis not present

## 2023-08-06 DIAGNOSIS — R2681 Unsteadiness on feet: Secondary | ICD-10-CM

## 2023-08-06 DIAGNOSIS — R29898 Other symptoms and signs involving the musculoskeletal system: Secondary | ICD-10-CM | POA: Diagnosis not present

## 2023-08-06 DIAGNOSIS — R2689 Other abnormalities of gait and mobility: Secondary | ICD-10-CM | POA: Diagnosis not present

## 2023-08-06 DIAGNOSIS — M5459 Other low back pain: Secondary | ICD-10-CM | POA: Diagnosis not present

## 2023-08-06 NOTE — Therapy (Signed)
OUTPATIENT PHYSICAL THERAPY CERVICAL AND THORACOLUMBAR TREATMENT    Patient Name: Matthew Tucker MRN: [id number] DOB:1951/01/23, 72 y.o., male Today's Date: 08/06/2023  END OF SESSION:  PT End of Session - 08/06/23 1019     Visit Number 8    Number of Visits 17    Date for PT Re-Evaluation 09/20/23    Authorization Type Medicare    Progress Note Due on Visit 10    PT Start Time 1018    PT Stop Time 1059    PT Time Calculation (min) 41 min    Equipment Utilized During Treatment Gait belt    Activity Tolerance Patient tolerated treatment well    Behavior During Therapy WFL for tasks assessed/performed              Past Medical History:  Diagnosis Date   Arthritis    Neck, bilateral hands   BACK PAIN, CHRONIC 10/31/2010   Cervical stenosis of spine    Chronic lumbar radiculopathy 05/20/2015   Diastolic dysfunction 05/21/2018   Family history of colon cancer Jan 27, 2013   Father died at 70yo   HYPERLIPIDEMIA September 12, 2007   Impaired glucose tolerance 08/29/2011   PARESTHESIA September 12, 2007   Toxic effect of chlorine gas(987.6) 10/31/2010   Past Surgical History:  Procedure Laterality Date   ANTERIOR CERVICAL DECOMP/DISCECTOMY FUSION N/A 08/14/2022   Procedure: ACDF - C2-C3;  Surgeon: Tia Alert, MD;  Location: [facility] OR;  Service: Neurosurgery;  Laterality: N/A;   BIOPSY  10/13/2021   Procedure: BIOPSY;  Surgeon: Napoleon Form, MD;  Location: [facility];  Service: Endoscopy;;   BUNIONECTOMY     CARPAL TUNNEL RELEASE Right 08/14/2022   Procedure: Right carpal tunnel release;  Surgeon: Tia Alert, MD;  Location: [facility] OR;  Service: Neurosurgery;  Laterality: Right;   COLONOSCOPY     COLONOSCOPY WITH PROPOFOL N/A 10/13/2021   Procedure: COLONOSCOPY WITH PROPOFOL;  Surgeon: Napoleon Form, MD;  Location: [facility];  Service: Endoscopy;  Laterality: N/A;   FOOT SURGERY Right    LUMBAR LAMINECTOMY/DECOMPRESSION MICRODISCECTOMY N/A 06/20/2018   Procedure: Laminectomy and  Foraminotomy - Lumbar one-Lumbar two - Lumbar two-Lumbar three - Lumbar three-Lumbar four - Lumbar four-Lumbar five;  Surgeon: Tia Alert, MD;  Location: [facility] OR;  Service: Neurosurgery;  Laterality: N/A;   mass removal     back, forehead; benign (lipoma)    mass removal  2011   head; benign   POLYPECTOMY  10/13/2021   Procedure: POLYPECTOMY;  Surgeon: Napoleon Form, MD;  Location: [facility];  Service: Endoscopy;;   POSTERIOR CERVICAL FUSION/FORAMINOTOMY N/A 01/03/2018   Procedure: Posterior Cervical Fusion with lateral mass fixation - Cervical three - Cervical seven, cervical laminectomy Cervical three-cervical seven;  Surgeon: Tia Alert, MD;  Location: [facility] OR;  Service: Neurosurgery;  Laterality: N/A;   ROOT CANAL     s/p lipoma right scalp posteriorly  2011   ULNAR NERVE TRANSPOSITION Right 08/14/2022   Procedure: Right ulnar release;  Surgeon: Tia Alert, MD;  Location: [facility] OR;  Service: Neurosurgery;  Laterality: Right;   Patient Active Problem List   Diagnosis Date Noted   CHF (congestive heart failure) (HCC) 12/28/2022   S/P cervical spinal fusion 08/14/2022   Muscle atrophy 05/05/2022   COVID-19 virus infection 04/06/2022   Polyp of ascending colon    Polyp of cecum    Polyp of rectum    Degeneration of lumbar intervertebral disc 12/21/2020   Osteoarthritis 12/21/2020   Rheumatoid arthritis (HCC)  12/21/2020   Vitamin D deficiency 08/22/2019   Polyarthralgia 08/22/2019   Hypokalemia 08/22/2019   CKD (chronic kidney disease) stage 3, GFR 30-59 ml/min (HCC) 02/20/2019   Hand joint stiff, unspecified laterality 02/20/2019   S/P lumbar laminectomy 06/20/2018   Diastolic dysfunction 05/21/2018   Peripheral edema 05/17/2018   Dyspnea 05/17/2018   Cervical vertebral fusion 01/03/2018   Congenital spinal stenosis of lumbar region 08/22/2017   Bilateral hand pain 03/24/2017   Elevated blood pressure reading without diagnosis of hypertension 03/24/2017   Grief  reaction 01/02/2017   Nocturia 04/28/2016   Erectile dysfunction 04/28/2016   Chronic lumbar radiculopathy 05/20/2015   Increased prostate specific antigen (PSA) velocity 05/20/2015   Paresthesia of right leg 01/29/2015   Syncope 04/13/2014   EKG abnormality 04/13/2014   Family history of colon cancer 01/17/2013   Allergic rhinitis 09/10/2011   Microhematuria 09/08/2011   Impaired glucose tolerance 08/29/2011   Colon cancer screening 08/29/2011   Backache 10/31/2010   Toxic effect of chlorine gas 10/31/2010   WEAKNESS, RIGHT SIDE OF BODY 02/28/2010   HIP PAIN, RIGHT 12/02/2007   Pain in joint, lower leg 12/02/2007   Pain in Soft Tissues of Limb 12/02/2007   Hyperlipidemia 09/02/2007   Anxiety state 09/02/2007   PARESTHESIA 09/02/2007    PCP: Corwin Levins, MD  REFERRING PROVIDER: Corwin Levins, MD  REFERRING DIAG:  M54.9 (ICD-10-CM) - Bilateral back pain, unspecified back location, unspecified chronicity  M54.2 (ICD-10-CM) - Neck pain    Rationale for Evaluation and Treatment: Rehabilitation  THERAPY DIAG:  Muscle weakness (generalized)  Other abnormalities of gait and mobility  Other low back pain  Unsteadiness on feet  ONSET DATE: 06/21/23 (referral date)  SUBJECTIVE:                                                                                                                                                                                           SUBJECTIVE STATEMENT: Patient goes by "[name]."   Patient reports doing okay. Was "beaten down" after last session but is feeling okay. Denies pain.   PERTINENT HISTORY:  Anterior Cervical Decompression/discetomy and fusion of C2-C3 (12/23), R carpal tunnel release (12/23), R ulnar nerve release (12/23), lumbar laminectomy/decompression microdiscetomy L1-L5 (2019), posterior cervical fusion/foraminotomy C3-C7 (2019), osteoarthritis of neck and bilateral hands, chronic back pain, cervical stenosis of spine, chronic  lumbar radiculopathy, hyperlipidemia, CHF, CKD stage 3, hx of R foot surgery  PAIN:  Are you having pain?  no Pt reports he cannot sit or walk for too long   PRECAUTIONS: Fall  RED FLAGS: None   WEIGHT BEARING RESTRICTIONS: No  FALLS:  Has patient fallen in last 6 months? No, but plenty of near misses   LIVING ENVIRONMENT: Lives with: lives with their spouse Lives in: House/apartment Stairs: Yes: Internal: 17 steps; on right going up, on left going up, and can reach both and External: 5 steps; on right going up, on left going up, and can reach both Has following equipment at home: Single point cane and Walker - 2 wheeled  OCCUPATION: Retired   PLOF: Independent  PATIENT GOALS: "I just want to work on every thing"   OBJECTIVE:  Note: Objective measures were completed at Evaluation unless otherwise noted.  DIAGNOSTIC FINDINGS:  No new imaging since operations on neck and low back  VITALS  Vitals:   08/06/23 1050  BP: 126/86  Pulse: 96    TODAY'S TREATMENT:         Ther Act  Discussed importance of pt slowly easing into activity, as he is placing himself at high risk of injury by doing too much. Pt reports it is "therapist's job" to tell him what to do. Informed him that therapist attempted to cue him several times last session but pt continued to do things the way he wanted. Informed pt that it is more important for him to recognize when he is straining his body, as therapist is not always with him when doing exercises and when she is, he is not receptive. Pt reports he will be better and slow down as he has not "moved like this since 2019".  Assessed vitals towards end of session as pt reports his BP was good this morning. Vitals WNL (see above)  TherEx: SciFit multi-peaks level 9.5 for 8 minutes using BUE/BLEs for neural priming for reciprocal movement, dynamic cardiovascular warmup and global strength. RPE of 7/10 following activity  DL to 12" box Q/25# KB, Z56 reps,  for improved posterior chain strength, facilitation of hip hinge and proper lifting technique. Min cues to maintain neutral spine position throughout. When asked how pt is feeling, pt would only report "I am okay".  Farmer's carries w/single 12# KB, x115' per side, for improved functional core stability. No instability noted w/activity.  Alt standing marches w/unilateral front rack hold w/12# KB, x5 reps per side, for improved core strength and single leg stability. Increased difficulty stabilizing on RLE > LLE. CGA throughout for safety.  Thrusters w/6# DB, 2x5 reps, for improved core stability and functional strength. Min cues to avoid hyperextending back throughout. Pt very challenged by this but performed well.   RPE of 8/10 following session     PATIENT EDATION:  Education details: Continue HEP, importance of slowly reintegrating into exercise  Person educated: Patient Education method: Explanation and Verbal cues Education comprehension: verbalized understanding, verbal cues required, and needs further education  HOME EXERCISE PROGRAM: Access Code: [email or web address] URL: [email or web address] Date: 07/17/2023 Prepared by: Maryruth Eve  Exercises - Sit to Stand Without Arm Support  - 1 x daily - 7 x weekly - 3 sets - 10 reps - Tandem Walking with Counter Support  - 1 x daily - 7 x weekly - 3-4 sets - Side Stepping with Resistance at Thighs and Counter Support  - 1 x daily - 7 x weekly - 3 sets - 10 reps - Forward Backward Monster Walk with Band at Thighs and Counter Support  - 1 x daily - 7 x weekly - 3 sets - 10 reps - Standing Hip Extension with Resistance at Ankles and Counter Support  - 1 x daily - 7  x weekly - 3 sets - 10 reps - Standing Hip Abduction with Resistance at Ankles and Counter Support  - 1 x daily - 7 x weekly - 3 sets - 10 reps  BP log and [employer] 6 week beginner walking program  ASSESSMENT:  CLINICAL  IMPRESSION: Emphasis of skilled PT session on improved functional core strength, single leg stability and endurance. Discussed importance of pt slowing down and reintegrating into exercise slowly, as pt could injure himself if he moves too quickly. Pt reports he will be better about this. Pt tolerated session well but continues to require seated rest breaks between exercises due to fatigue. Pt most challenged by standing marches, RLE >LLE, due to hip instability, but was able to perform better if moving slowly. Continue POC.   OBJECTIVE IMPAIRMENTS: Abnormal gait, decreased activity tolerance, decreased balance, decreased coordination, decreased endurance, decreased knowledge of condition, decreased knowledge of use of DME, decreased mobility, difficulty walking, decreased strength, impaired flexibility, impaired sensation, impaired UE functional use, improper body mechanics, and pain  ACTIVITY LIMITATIONS: carrying, lifting, bending, sitting, standing, squatting, stairs, transfers, bathing, locomotion level, and caring for others  PARTICIPATION LIMITATIONS: meal prep, cleaning, laundry, driving, shopping, community activity, and yard work  PERSONAL FACTORS: Age, Fitness, Past/current experiences, and 1 comorbidity: Lumbar laminectomy and C3-C7 fusion  are also affecting patient's functional outcome.    REHAB POTENTIAL: Good  CLINICAL DECISION MAKING: Stable/uncomplicated  EVALUATION COMPLEXITY: Low   GOALS: Goals reviewed with patient? Yes  SHORT TERM GOALS: Target date: 08/09/2023    Pt will be independent with initial HEP for improved strength, balance, transfers and gait.  Baseline: not established on eval  Goal status: INITIAL  2.  Patient will improve FGA to greater than 19/30 to indicate a decreased risk of falls and improved dynamic stability.   Baseline: 17/30 Goal status: INITIAL  3. Patient will improve their 5x Sit to Stand score to less than 20 seconds to demonstrate a  decreased risk for falls and improved LE strength.   Baseline: 22.91 seconds without UE support Goal status: INITIAL  4.  Pt will be compliant w/walking program for improved endurance and safety w/gait  Baseline:  Goal status: INITIAL   LONG TERM GOALS: Target date: 09/06/2023    Pt will be independent with final HEP for improved strength, balance, transfers and gait.  Baseline:  Goal status: INITIAL  2.  Patient will improve FGA to greater than 22/30 to indicate a decreased risk of falls and improved dynamic stability.   Baseline: 17/30 Goal status: INITIAL  3.  Patient will improve modified ODI score to 28% impairment or less indicate a clinically important improvement in low back pain.   Baseline: 38% impairment  Goal status: INITIAL  4.  Patient will improve their 5x Sit to Stand score to less than 20 seconds to demonstrate a decreased risk for falls and improved LE strength.   Baseline: 22.91 seconds without UE support Goal status: INITIAL  5.  Pt will improve gait velocity to at least 3.2 ft/s  for improved gait efficiency and safety  Baseline: 2.93 ft/s  Goal status: INITIAL   PLAN:  PT FREQUENCY: 2x/week  PT DURATION: 8 weeks  PLANNED INTERVENTIONS: 97164- PT Re-evaluation, 97110-Therapeutic exercises, 97530- Therapeutic activity, 97112- Neuromuscular re-education, 97535- Self Care, 54098- Manual therapy, [id number]- Gait training, [phone]- Aquatic Therapy, [phone]- Electrical stimulation (manual), Balance training, Stair training, Dry Needling, Joint mobilization, Spinal mobilization, and DME instructions.  PLAN FOR NEXT SESSION:  Goals. Progress HEP for global strength and mobility (reinforce dead lift technique and trial again with weights as tolerated), work on dynamic stability challenges requiring modified SLS and directional turns, review waking program, redirect as needed to keep patient on task , hip strength, heel taps    Liandro Thelin E Young Mulvey, PT, DPT 08/06/2023,  10:59 AM

## 2023-08-08 ENCOUNTER — Ambulatory Visit: Payer: Medicare Other | Admitting: Physical Therapy

## 2023-08-08 DIAGNOSIS — M5459 Other low back pain: Secondary | ICD-10-CM

## 2023-08-08 DIAGNOSIS — M542 Cervicalgia: Secondary | ICD-10-CM | POA: Diagnosis not present

## 2023-08-08 DIAGNOSIS — R29898 Other symptoms and signs involving the musculoskeletal system: Secondary | ICD-10-CM | POA: Diagnosis not present

## 2023-08-08 DIAGNOSIS — R2681 Unsteadiness on feet: Secondary | ICD-10-CM | POA: Diagnosis not present

## 2023-08-08 DIAGNOSIS — M6281 Muscle weakness (generalized): Secondary | ICD-10-CM | POA: Diagnosis not present

## 2023-08-08 DIAGNOSIS — R2689 Other abnormalities of gait and mobility: Secondary | ICD-10-CM

## 2023-08-08 NOTE — Therapy (Signed)
OUTPATIENT PHYSICAL THERAPY CERVICAL AND THORACOLUMBAR TREATMENT - 10TH VISIT PROGRESS NOTE   Patient Name: Matthew Tucker MRN: [id number] DOB:March 18, 1951, 72 y.o., male Today's Date: 08/08/2023  Physical Therapy Progress Note   Dates of Reporting Period:07/12/23 - 08/08/23  See Note below for Objective Data and Assessment of Progress/Goals.   END OF SESSION:  PT End of Session - 08/08/23 1019     Visit Number 9    Number of Visits 17    Date for PT Re-Evaluation 09/20/23    Authorization Type Medicare    Progress Note Due on Visit 10    PT Start Time 1017    PT Stop Time 1101    PT Time Calculation (min) 44 min    Equipment Utilized During Treatment Gait belt    Activity Tolerance Patient tolerated treatment well    Behavior During Therapy WFL for tasks assessed/performed              Past Medical History:  Diagnosis Date   Arthritis    Neck, bilateral hands   BACK PAIN, CHRONIC 10/31/2010   Cervical stenosis of spine    Chronic lumbar radiculopathy 05/20/2015   Diastolic dysfunction 05/21/2018   Family history of colon cancer 24-Jan-2013   Father died at 70yo   HYPERLIPIDEMIA Sep 09, 2007   Impaired glucose tolerance 08/29/2011   PARESTHESIA 09/09/07   Toxic effect of chlorine gas(987.6) 10/31/2010   Past Surgical History:  Procedure Laterality Date   ANTERIOR CERVICAL DECOMP/DISCECTOMY FUSION N/A 08/14/2022   Procedure: ACDF - C2-C3;  Surgeon: Tia Alert, MD;  Location: [facility] OR;  Service: Neurosurgery;  Laterality: N/A;   BIOPSY  10/13/2021   Procedure: BIOPSY;  Surgeon: Napoleon Form, MD;  Location: [facility];  Service: Endoscopy;;   BUNIONECTOMY     CARPAL TUNNEL RELEASE Right 08/14/2022   Procedure: Right carpal tunnel release;  Surgeon: Tia Alert, MD;  Location: [facility] OR;  Service: Neurosurgery;  Laterality: Right;   COLONOSCOPY     COLONOSCOPY WITH PROPOFOL N/A 10/13/2021   Procedure: COLONOSCOPY WITH PROPOFOL;  Surgeon: Napoleon Form, MD;   Location: [facility];  Service: Endoscopy;  Laterality: N/A;   FOOT SURGERY Right    LUMBAR LAMINECTOMY/DECOMPRESSION MICRODISCECTOMY N/A 06/20/2018   Procedure: Laminectomy and Foraminotomy - Lumbar one-Lumbar two - Lumbar two-Lumbar three - Lumbar three-Lumbar four - Lumbar four-Lumbar five;  Surgeon: Tia Alert, MD;  Location: [facility] OR;  Service: Neurosurgery;  Laterality: N/A;   mass removal     back, forehead; benign (lipoma)    mass removal  2011   head; benign   POLYPECTOMY  10/13/2021   Procedure: POLYPECTOMY;  Surgeon: Napoleon Form, MD;  Location: [facility];  Service: Endoscopy;;   POSTERIOR CERVICAL FUSION/FORAMINOTOMY N/A 01/03/2018   Procedure: Posterior Cervical Fusion with lateral mass fixation - Cervical three - Cervical seven, cervical laminectomy Cervical three-cervical seven;  Surgeon: Tia Alert, MD;  Location: [facility] OR;  Service: Neurosurgery;  Laterality: N/A;   ROOT CANAL     s/p lipoma right scalp posteriorly  2011   ULNAR NERVE TRANSPOSITION Right 08/14/2022   Procedure: Right ulnar release;  Surgeon: Tia Alert, MD;  Location: [facility] OR;  Service: Neurosurgery;  Laterality: Right;   Patient Active Problem List   Diagnosis Date Noted   CHF (congestive heart failure) (HCC) 12/28/2022   S/P cervical spinal fusion 08/14/2022   Muscle atrophy 05/05/2022   COVID-19 virus infection 04/06/2022   Polyp of ascending colon  Polyp of cecum    Polyp of rectum    Degeneration of lumbar intervertebral disc 12/21/2020   Osteoarthritis 12/21/2020   Rheumatoid arthritis (HCC) 12/21/2020   Vitamin D deficiency 08/22/2019   Polyarthralgia 08/22/2019   Hypokalemia 08/22/2019   CKD (chronic kidney disease) stage 3, GFR 30-59 ml/min (HCC) 02/20/2019   Hand joint stiff, unspecified laterality 02/20/2019   S/P lumbar laminectomy 06/20/2018   Diastolic dysfunction 05/21/2018   Peripheral edema 05/17/2018   Dyspnea 05/17/2018   Cervical vertebral fusion 01/03/2018    Congenital spinal stenosis of lumbar region 08/22/2017   Bilateral hand pain 03/24/2017   Elevated blood pressure reading without diagnosis of hypertension 03/24/2017   Grief reaction 01/02/2017   Nocturia 04/28/2016   Erectile dysfunction 04/28/2016   Chronic lumbar radiculopathy 05/20/2015   Increased prostate specific antigen (PSA) velocity 05/20/2015   Paresthesia of right leg 01/29/2015   Syncope 04/13/2014   EKG abnormality 04/13/2014   Family history of colon cancer 01/17/2013   Allergic rhinitis 09/10/2011   Microhematuria 09/08/2011   Impaired glucose tolerance 08/29/2011   Colon cancer screening 08/29/2011   Backache 10/31/2010   Toxic effect of chlorine gas 10/31/2010   WEAKNESS, RIGHT SIDE OF BODY 02/28/2010   HIP PAIN, RIGHT 12/02/2007   Pain in joint, lower leg 12/02/2007   Pain in Soft Tissues of Limb 12/02/2007   Hyperlipidemia 09/02/2007   Anxiety state 09/02/2007   PARESTHESIA 09/02/2007    PCP: Matthew Levins, MD  REFERRING PROVIDER: Corwin Levins, MD  REFERRING DIAG:  M54.9 (ICD-10-CM) - Bilateral back pain, unspecified back location, unspecified chronicity  M54.2 (ICD-10-CM) - Neck pain    Rationale for Evaluation and Treatment: Rehabilitation  THERAPY DIAG:  Muscle weakness (generalized)  Other low back pain  Other abnormalities of gait and mobility  Unsteadiness on feet  ONSET DATE: 06/21/23 (referral date)  SUBJECTIVE:                                                                                                                                                                                           SUBJECTIVE STATEMENT: Patient goes by "[name]."   Patient reports doing okay. Was "beaten down" after last session but is feeling okay. Denies pain.   PERTINENT HISTORY:  Anterior Cervical Decompression/discetomy and fusion of C2-C3 (12/23), R carpal tunnel release (12/23), R ulnar nerve release (12/23), lumbar laminectomy/decompression  microdiscetomy L1-L5 (2019), posterior cervical fusion/foraminotomy C3-C7 (2019), osteoarthritis of neck and bilateral hands, chronic back pain, cervical stenosis of spine, chronic lumbar radiculopathy, hyperlipidemia, CHF, CKD stage 3, hx of R foot surgery  PAIN:  Are you having pain?  no Pt reports he cannot sit or walk for too long   PRECAUTIONS: Fall  RED FLAGS: None   WEIGHT BEARING RESTRICTIONS: No  FALLS:  Has patient fallen in last 6 months? No, but plenty of near misses   LIVING ENVIRONMENT: Lives with: lives with their spouse Lives in: House/apartment Stairs: Yes: Internal: 17 steps; on right going up, on left going up, and can reach both and External: 5 steps; on right going up, on left going up, and can reach both Has following equipment at home: Single point cane and Walker - 2 wheeled  OCCUPATION: Retired   PLOF: Independent  PATIENT GOALS: "I just want to work on every thing"   OBJECTIVE:  Note: Objective measures were completed at Evaluation unless otherwise noted.  DIAGNOSTIC FINDINGS:  No new imaging since operations on neck and low back  VITALS  There were no vitals filed for this visit.   TODAY'S TREATMENT:         Ther Act  STG Assessment    [facility] PT Assessment - 08/08/23 1023       Transfers   Five time sit to stand comments  14.62s   no UE support     Functional Gait  Assessment   Gait assessed  Yes    Gait Level Surface Walks 20 ft in less than 7 sec but greater than 5.5 sec, uses assistive device, slower speed, mild gait deviations, or deviates 6-10 in outside of the 12 in walkway width.   6.41s   Change in Gait Speed Able to smoothly change walking speed without loss of balance or gait deviation. Deviate no more than 6 in outside of the 12 in walkway width.    Gait with Horizontal Head Turns Performs head turns smoothly with no change in gait. Deviates no more than 6 in outside 12 in walkway width    Gait with Vertical Head Turns Performs  head turns with no change in gait. Deviates no more than 6 in outside 12 in walkway width.    Gait and Pivot Turn Pivot turns safely within 3 sec and stops quickly with no loss of balance.    Step Over Obstacle Is able to step over 2 stacked shoe boxes taped together (9 in total height) without changing gait speed. No evidence of imbalance.    Gait with Narrow Base of Support Is able to ambulate for 10 steps heel to toe with no staggering.    Gait with Eyes Closed Walks 20 ft, slow speed, abnormal gait pattern, evidence for imbalance, deviates 10-15 in outside 12 in walkway width. Requires more than 9 sec to ambulate 20 ft.   9.56s, mild deviation to R sde   Ambulating Backwards Walks 20 ft, slow speed, abnormal gait pattern, evidence for imbalance, deviates 10-15 in outside 12 in walkway width.   23.56s   Steps Alternating feet, no rail.    Total Score 25    FGA comment: Low fall risk            Ther Ex  In // bars, alt step ups w/contralateral march on Bosu (blue side up) w/BUE support for improved functional hip strength, ankle strategy and single leg stability. Pt performed x15 reps per side w/5s isometric hold at top of rep.   Staggered stance goodmornings, x10 reps per side, for improved hamstring mobility, facilitation of hip flexion and single leg stance. Pt performed well and did much better job modifying movement to avoid pain this date.  Child's pose, 2x60s hold, for improved spinal mobility and global stretch. Pt challenged with this, stating it was the hardest thing he did today, so will continue to work on mobility.     PATIENT EDATION:  Education details: Continue HEP, goal results  Person educated: Patient Education method: Explanation and Verbal cues Education comprehension: verbalized understanding, verbal cues required, and needs further education  HOME EXERCISE PROGRAM: Access Code: [email or web address] URL: [email or web address] Date: 07/17/2023 Prepared by:  Maryruth Eve  Exercises - Sit to Stand Without Arm Support  - 1 x daily - 7 x weekly - 3 sets - 10 reps - Tandem Walking with Counter Support  - 1 x daily - 7 x weekly - 3-4 sets - Side Stepping with Resistance at Thighs and Counter Support  - 1 x daily - 7 x weekly - 3 sets - 10 reps - Forward Backward Monster Walk with Band at [employer] and Counter Support  - 1 x daily - 7 x weekly - 3 sets - 10 reps - Standing Hip Extension with Resistance at Ankles and Counter Support  - 1 x daily - 7 x weekly - 3 sets - 10 reps - Standing Hip Abduction with Resistance at Ankles and Counter Support  - 1 x daily - 7 x weekly - 3 sets - 10 reps  BP log and [employer] 6 week beginner walking program  ASSESSMENT:  CLINICAL IMPRESSION: 10th visit PN (completed early): Emphasis of skilled PT session on STG assessment, spinal mobility and single leg stability. Pt has met 4/4 STGs, being independent w/HEP and walking program. Pt has significantly improved his score on FGA, scoring a 25/30 this date, meeting both his STG and LTG and indicating low fall risk. Pt also improved his time and quality of movement on 5x STS, w/more equal weight shift noted this date. Pt continues to report pain that radiates down posterior RLE as his "biggest problem" but is overall more stable and slowly regaining strength on R side. Continue POC.   OBJECTIVE IMPAIRMENTS: Abnormal gait, decreased activity tolerance, decreased balance, decreased coordination, decreased endurance, decreased knowledge of condition, decreased knowledge of use of DME, decreased mobility, difficulty walking, decreased strength, impaired flexibility, impaired sensation, impaired UE functional use, improper body mechanics, and pain  ACTIVITY LIMITATIONS: carrying, lifting, bending, sitting, standing, squatting, stairs, transfers, bathing, locomotion level, and caring for others  PARTICIPATION LIMITATIONS: meal prep, cleaning,  laundry, driving, shopping, community activity, and yard work  PERSONAL FACTORS: Age, Fitness, Past/current experiences, and 1 comorbidity: Lumbar laminectomy and C3-C7 fusion  are also affecting patient's functional outcome.    REHAB POTENTIAL: Good  CLINICAL DECISION MAKING: Stable/uncomplicated  EVALUATION COMPLEXITY: Low   GOALS: Goals reviewed with patient? Yes  SHORT TERM GOALS: Target date: 08/09/2023    Pt will be independent with initial HEP for improved strength, balance, transfers and gait.  Baseline: not established on eval  Goal status: MET  2.  Patient will improve FGA to greater than 19/30 to indicate a decreased risk of falls and improved dynamic stability.   Baseline: 17/30;25/30 (11/27) Goal status: MET  3. Patient will improve their 5x Sit to Stand score to less than 20 seconds to demonstrate a decreased risk for falls and improved LE strength.   Baseline: 22.91 seconds without UE support; 14.62s w/o UE support  Goal status: MET  4.  Pt will be compliant w/walking program for improved endurance and safety w/gait  Baseline:  Goal status: MET  LONG TERM GOALS: Target date: 09/06/2023    Pt will be independent with final HEP for improved strength, balance, transfers and gait.  Baseline:  Goal status: INITIAL  2.  Patient will improve FGA to greater than 22/30 to indicate a decreased risk of falls and improved dynamic stability.   Baseline: 17/30; 25/30  Goal status: MET   3.  Patient will improve modified ODI score to 28% impairment or less indicate a clinically important improvement in low back pain.   Baseline: 38% impairment  Goal status: INITIAL  4.  Patient will improve their 5x Sit to Stand score to less than 13 seconds to demonstrate a decreased risk for falls and improved LE strength.   Baseline: 22.91 seconds without UE support; 14.62s w/o UE support (11/27) Goal status: REVISED  5.  Pt will improve gait velocity to at least 3.2  ft/s  for improved gait efficiency and safety  Baseline: 2.93 ft/s  Goal status: INITIAL   PLAN:  PT FREQUENCY: 2x/week  PT DURATION: 8 weeks  PLANNED INTERVENTIONS: 97164- PT Re-evaluation, 97110-Therapeutic exercises, 97530- Therapeutic activity, 97112- Neuromuscular re-education, 97535- Self Care, 16109- Manual therapy, [id number]- Gait training, [phone]- Aquatic Therapy, [phone]- Electrical stimulation (manual), Balance training, Stair training, Dry Needling, Joint mobilization, Spinal mobilization, and DME instructions.  PLAN FOR NEXT SESSION: Progress HEP for global strength and mobility (reinforce dead lift technique and trial again with weights as tolerated), work on dynamic stability challenges requiring modified SLS and directional turns, review waking program, redirect as needed to keep patient on task , hip strength, heel taps    Itati Brocksmith E Whitley Patchen, PT, DPT 08/08/2023, 11:02 AM

## 2023-08-15 ENCOUNTER — Ambulatory Visit: Payer: Medicare Other | Attending: Internal Medicine | Admitting: Physical Therapy

## 2023-08-15 ENCOUNTER — Encounter: Payer: Self-pay | Admitting: Physical Therapy

## 2023-08-15 DIAGNOSIS — M5459 Other low back pain: Secondary | ICD-10-CM | POA: Diagnosis not present

## 2023-08-15 DIAGNOSIS — M542 Cervicalgia: Secondary | ICD-10-CM | POA: Diagnosis not present

## 2023-08-15 DIAGNOSIS — R2689 Other abnormalities of gait and mobility: Secondary | ICD-10-CM | POA: Diagnosis not present

## 2023-08-15 DIAGNOSIS — M5416 Radiculopathy, lumbar region: Secondary | ICD-10-CM

## 2023-08-15 DIAGNOSIS — R2681 Unsteadiness on feet: Secondary | ICD-10-CM

## 2023-08-15 DIAGNOSIS — R29898 Other symptoms and signs involving the musculoskeletal system: Secondary | ICD-10-CM | POA: Diagnosis not present

## 2023-08-15 DIAGNOSIS — M6281 Muscle weakness (generalized): Secondary | ICD-10-CM | POA: Diagnosis not present

## 2023-08-15 NOTE — Therapy (Addendum)
OUTPATIENT PHYSICAL THERAPY CERVICAL AND THORACOLUMBAR TREATMENT - 10TH VISIT PROGRESS NOTE  I have read and reviewed the attached note and am in agreement with the documentation provided.   This licensed clinician was present and actively directing care throughout the session at all times.    Matthew Tucker, PT, DPT     Patient Name: Matthew Tucker MRN: [id number] DOB:09-Mar-1951, 72 y.o., male Today's Date: 08/15/2023  Physical Therapy Progress Note   Dates of Reporting Period:07/12/23 - 08/08/23  See Note below for Objective Data and Assessment of Progress/Goals.   END OF SESSION:  PT End of Session - 08/15/23 1015     Visit Number 10    Number of Visits 17    Date for PT Re-Evaluation 09/20/23    Authorization Type Medicare    PT Start Time 1015    PT Stop Time 1101    PT Time Calculation (min) 46 min    Activity Tolerance Patient tolerated treatment well    Behavior During Therapy [facility] for tasks assessed/performed              Past Medical History:  Diagnosis Date   Arthritis    Neck, bilateral hands   BACK PAIN, CHRONIC 10/31/2010   Cervical stenosis of spine    Chronic lumbar radiculopathy 05/20/2015   Diastolic dysfunction 05/21/2018   Family history of colon cancer 23-Jan-2013   Father died at 70yo   HYPERLIPIDEMIA 2007-09-08   Impaired glucose tolerance 08/29/2011   PARESTHESIA 2007/09/08   Toxic effect of chlorine gas(987.6) 10/31/2010   Past Surgical History:  Procedure Laterality Date   ANTERIOR CERVICAL DECOMP/DISCECTOMY FUSION N/A 08/14/2022   Procedure: ACDF - C2-C3;  Surgeon: Tia Alert, MD;  Location: [facility] OR;  Service: Neurosurgery;  Laterality: N/A;   BIOPSY  10/13/2021   Procedure: BIOPSY;  Surgeon: Napoleon Form, MD;  Location: [facility];  Service: Endoscopy;;   BUNIONECTOMY     CARPAL TUNNEL RELEASE Right 08/14/2022   Procedure: Right carpal tunnel release;  Surgeon: Tia Alert, MD;  Location: [facility] OR;  Service: Neurosurgery;   Laterality: Right;   COLONOSCOPY     COLONOSCOPY WITH PROPOFOL N/A 10/13/2021   Procedure: COLONOSCOPY WITH PROPOFOL;  Surgeon: Napoleon Form, MD;  Location: [facility];  Service: Endoscopy;  Laterality: N/A;   FOOT SURGERY Right    LUMBAR LAMINECTOMY/DECOMPRESSION MICRODISCECTOMY N/A 06/20/2018   Procedure: Laminectomy and Foraminotomy - Lumbar one-Lumbar two - Lumbar two-Lumbar three - Lumbar three-Lumbar four - Lumbar four-Lumbar five;  Surgeon: Tia Alert, MD;  Location: [facility] OR;  Service: Neurosurgery;  Laterality: N/A;   mass removal     back, forehead; benign (lipoma)    mass removal  2011   head; benign   POLYPECTOMY  10/13/2021   Procedure: POLYPECTOMY;  Surgeon: Napoleon Form, MD;  Location: [facility];  Service: Endoscopy;;   POSTERIOR CERVICAL FUSION/FORAMINOTOMY N/A 01/03/2018   Procedure: Posterior Cervical Fusion with lateral mass fixation - Cervical three - Cervical seven, cervical laminectomy Cervical three-cervical seven;  Surgeon: Tia Alert, MD;  Location: [facility] OR;  Service: Neurosurgery;  Laterality: N/A;   ROOT CANAL     s/p lipoma right scalp posteriorly  2011   ULNAR NERVE TRANSPOSITION Right 08/14/2022   Procedure: Right ulnar release;  Surgeon: Tia Alert, MD;  Location: [facility] OR;  Service: Neurosurgery;  Laterality: Right;   Patient Active Problem List   Diagnosis Date Noted   CHF (congestive heart failure) (HCC)  12/28/2022   S/P cervical spinal fusion 08/14/2022   Muscle atrophy 05/05/2022   COVID-19 virus infection 04/06/2022   Polyp of ascending colon    Polyp of cecum    Polyp of rectum    Degeneration of lumbar intervertebral disc 12/21/2020   Osteoarthritis 12/21/2020   Rheumatoid arthritis (HCC) 12/21/2020   Vitamin D deficiency 08/22/2019   Polyarthralgia 08/22/2019   Hypokalemia 08/22/2019   CKD (chronic kidney disease) stage 3, GFR 30-59 ml/min (HCC) 02/20/2019   Hand joint stiff, unspecified laterality 02/20/2019   S/P lumbar  laminectomy 06/20/2018   Diastolic dysfunction 05/21/2018   Peripheral edema 05/17/2018   Dyspnea 05/17/2018   Cervical vertebral fusion 01/03/2018   Congenital spinal stenosis of lumbar region 08/22/2017   Bilateral hand pain 03/24/2017   Elevated blood pressure reading without diagnosis of hypertension 03/24/2017   Grief reaction 01/02/2017   Nocturia 04/28/2016   Erectile dysfunction 04/28/2016   Chronic lumbar radiculopathy 05/20/2015   Increased prostate specific antigen (PSA) velocity 05/20/2015   Paresthesia of right leg 01/29/2015   Syncope 04/13/2014   EKG abnormality 04/13/2014   Family history of colon cancer 01/17/2013   Allergic rhinitis 09/10/2011   Microhematuria 09/08/2011   Impaired glucose tolerance 08/29/2011   Colon cancer screening 08/29/2011   Backache 10/31/2010   Toxic effect of chlorine gas 10/31/2010   WEAKNESS, RIGHT SIDE OF BODY 02/28/2010   HIP PAIN, RIGHT 12/02/2007   Pain in joint, lower leg 12/02/2007   Pain in Soft Tissues of Limb 12/02/2007   Hyperlipidemia 09/02/2007   Anxiety state 09/02/2007   PARESTHESIA 09/02/2007    PCP: Matthew Levins, MD  REFERRING PROVIDER: Corwin Levins, MD  REFERRING DIAG:  M54.9 (ICD-10-CM) - Bilateral back pain, unspecified back location, unspecified chronicity  M54.2 (ICD-10-CM) - Neck pain    Rationale for Evaluation and Treatment: Rehabilitation  THERAPY DIAG:  Muscle weakness (generalized)  Other low back pain  Other abnormalities of gait and mobility  Unsteadiness on feet  Right hand weakness  Cervicalgia  Radiculopathy, lumbar region  ONSET DATE: 06/21/23 (referral date)  SUBJECTIVE:                                                                                                                                                                                           SUBJECTIVE STATEMENT: Patient goes by "[name]."   Patient reported doing good today. Denied pain at time of session.    PERTINENT HISTORY:  Anterior Cervical Decompression/discetomy and fusion of C2-C3 (12/23), R carpal tunnel release (12/23), R ulnar nerve release (12/23), lumbar laminectomy/decompression microdiscetomy L1-L5 (2019), posterior cervical fusion/foraminotomy C3-C7 (2019), osteoarthritis  of neck and bilateral hands, chronic back pain, cervical stenosis of spine, chronic lumbar radiculopathy, hyperlipidemia, CHF, CKD stage 3, hx of R foot surgery  PAIN:  Are you having pain?  no Pt reports he cannot sit or walk for too long   PRECAUTIONS: Fall  RED FLAGS: None   WEIGHT BEARING RESTRICTIONS: No  FALLS:  Has patient fallen in last 6 months? No, but plenty of near misses   LIVING ENVIRONMENT: Lives with: lives with their spouse Lives in: House/apartment Stairs: Yes: Internal: 17 steps; on right going up, on left going up, and can reach both and External: 5 steps; on right going up, on left going up, and can reach both Has following equipment at home: Single point cane and Walker - 2 wheeled  OCCUPATION: Retired   PLOF: Independent  PATIENT GOALS: "I just want to work on every thing"   OBJECTIVE:  Note: Objective measures were completed at Evaluation unless otherwise noted.  DIAGNOSTIC FINDINGS:  No new imaging since operations on neck and low back  VITALS  There were no vitals filed for this visit.   TODAY'S TREATMENT:         SciFit: - hills, level 9, 8 min - bilat UE/LE - cardio warmup, RPE 6/10   There Ex: - bosu ball balance (blue side down)  - took a few minutes to adjust but was able to maintain balance with minimal shaking/swaying  - bosu ball squats (blue side down), 2x5 reps  - lunges w/ twist, 6lb med ball, ~10 steps per side  - marching with 10lb KB overhead, 2 sets of ~5 marches per side   Circuit: - 5 STS w/o UE support straight into 115' lap x 3 w/o breaks  - cardiovascular endurance    PATIENT EDATION:  Education details: Continue HEP, goal  results  Person educated: Patient Education method: Explanation and Verbal cues Education comprehension: verbalized understanding, verbal cues required, and needs further education  HOME EXERCISE PROGRAM: Access Code: [email or web address] URL: [email or web address] Date: 07/17/2023 Prepared by: Maryruth Eve  Exercises - Sit to Stand Without Arm Support  - 1 x daily - 7 x weekly - 3 sets - 10 reps - Tandem Walking with Counter Support  - 1 x daily - 7 x weekly - 3-4 sets - Side Stepping with Resistance at Thighs and Counter Support  - 1 x daily - 7 x weekly - 3 sets - 10 reps - Forward Backward Monster Walk with Band at [employer] and Counter Support  - 1 x daily - 7 x weekly - 3 sets - 10 reps - Standing Hip Extension with Resistance at Ankles and Counter Support  - 1 x daily - 7 x weekly - 3 sets - 10 reps - Standing Hip Abduction with Resistance at Ankles and Counter Support  - 1 x daily - 7 x weekly - 3 sets - 10 reps  BP log and [employer] 6 week beginner walking program  ASSESSMENT:  CLINICAL IMPRESSION: Patient seen for skilled physical therapy session with emphasis on activity tolerance and LE strength. Patient was able to maintain consistent pace on SciFit for 8 minutes without a break and reported RPE of 6/10 following activity. He took a few minutes to adjust to the bosu ball with the blue side down and started with moderate shaking/swaying and was able to control to minimal shaking/swaying. Did well with bosu squat exercise and reported feeling it in his legs afterwards. Lunge activity was more  challenging to coordinate proper form and balance. Did well with circuit at the end of session while already tired from the remainder of the session and reported it was not tiring. Would continue to benefit from further endurance training mixed with LE strength and single leg balance. Continue POC.   OBJECTIVE IMPAIRMENTS: Abnormal gait, decreased activity  tolerance, decreased balance, decreased coordination, decreased endurance, decreased knowledge of condition, decreased knowledge of use of DME, decreased mobility, difficulty walking, decreased strength, impaired flexibility, impaired sensation, impaired UE functional use, improper body mechanics, and pain  ACTIVITY LIMITATIONS: carrying, lifting, bending, sitting, standing, squatting, stairs, transfers, bathing, locomotion level, and caring for others  PARTICIPATION LIMITATIONS: meal prep, cleaning, laundry, driving, shopping, community activity, and yard work  PERSONAL FACTORS: Age, Fitness, Past/current experiences, and 1 comorbidity: Lumbar laminectomy and C3-C7 fusion  are also affecting patient's functional outcome.    REHAB POTENTIAL: Good  CLINICAL DECISION MAKING: Stable/uncomplicated  EVALUATION COMPLEXITY: Low   GOALS: Goals reviewed with patient? Yes  SHORT TERM GOALS: Target date: 08/09/2023    Pt will be independent with initial HEP for improved strength, balance, transfers and gait.  Baseline: not established on eval  Goal status: MET  2.  Patient will improve FGA to greater than 19/30 to indicate a decreased risk of falls and improved dynamic stability.   Baseline: 17/30;25/30 (11/27) Goal status: MET  3. Patient will improve their 5x Sit to Stand score to less than 20 seconds to demonstrate a decreased risk for falls and improved LE strength.   Baseline: 22.91 seconds without UE support; 14.62s w/o UE support  Goal status: MET  4.  Pt will be compliant w/walking program for improved endurance and safety w/gait  Baseline:  Goal status: MET    LONG TERM GOALS: Target date: 09/06/2023    Pt will be independent with final HEP for improved strength, balance, transfers and gait.  Baseline:  Goal status: INITIAL  2.  Patient will improve FGA to greater than 22/30 to indicate a decreased risk of falls and improved dynamic stability.   Baseline: 17/30; 25/30   Goal status: MET   3.  Patient will improve modified ODI score to 28% impairment or less indicate a clinically important improvement in low back pain.   Baseline: 38% impairment  Goal status: INITIAL  4.  Patient will improve their 5x Sit to Stand score to less than 13 seconds to demonstrate a decreased risk for falls and improved LE strength.   Baseline: 22.91 seconds without UE support; 14.62s w/o UE support (11/27) Goal status: REVISED  5.  Pt will improve gait velocity to at least 3.2 ft/s  for improved gait efficiency and safety  Baseline: 2.93 ft/s  Goal status: INITIAL   PLAN:  PT FREQUENCY: 2x/week  PT DURATION: 8 weeks  PLANNED INTERVENTIONS: 97164- PT Re-evaluation, 97110-Therapeutic exercises, 97530- Therapeutic activity, 97112- Neuromuscular re-education, 97535- Self Care, [id number]- Manual therapy, [id number]- Gait training, [phone]- Aquatic Therapy, [phone]- Electrical stimulation (manual), Balance training, Stair training, Dry Needling, Joint mobilization, Spinal mobilization, and DME instructions.  PLAN FOR NEXT SESSION: Progress HEP for global strength and mobility (reinforce dead lift technique and trial again with weights as tolerated), work on dynamic stability challenges requiring modified SLS and directional turns, review waking program, redirect as needed to keep patient on task , hip strength, heel taps    Dolph Tavano M Aleece Loyd, SPT  08/15/2023, 11:18 AM

## 2023-08-17 ENCOUNTER — Encounter: Payer: Self-pay | Admitting: Physical Therapy

## 2023-08-17 ENCOUNTER — Ambulatory Visit: Payer: Medicare Other | Admitting: Physical Therapy

## 2023-08-17 VITALS — BP 137/79 | HR 68

## 2023-08-17 DIAGNOSIS — R29898 Other symptoms and signs involving the musculoskeletal system: Secondary | ICD-10-CM | POA: Diagnosis not present

## 2023-08-17 DIAGNOSIS — R2689 Other abnormalities of gait and mobility: Secondary | ICD-10-CM

## 2023-08-17 DIAGNOSIS — M542 Cervicalgia: Secondary | ICD-10-CM | POA: Diagnosis not present

## 2023-08-17 DIAGNOSIS — R2681 Unsteadiness on feet: Secondary | ICD-10-CM | POA: Diagnosis not present

## 2023-08-17 DIAGNOSIS — M6281 Muscle weakness (generalized): Secondary | ICD-10-CM | POA: Diagnosis not present

## 2023-08-17 DIAGNOSIS — M5459 Other low back pain: Secondary | ICD-10-CM

## 2023-08-17 NOTE — Therapy (Signed)
OUTPATIENT PHYSICAL THERAPY CERVICAL AND THORACOLUMBAR TREATMENT  Patient Name: Matthew Tucker MRN: [id number] DOB:1950-12-21, 72 y.o., male Today's Date: 08/17/2023   END OF SESSION:  PT End of Session - 08/17/23 0935     Visit Number 11    Number of Visits 17    Date for PT Re-Evaluation 09/20/23    Authorization Type Medicare    Progress Note Due on Visit 10    PT Start Time 0934    PT Stop Time 1016    PT Time Calculation (min) 42 min    Equipment Utilized During Treatment Gait belt    Activity Tolerance Patient tolerated treatment well    Behavior During Therapy WFL for tasks assessed/performed              Past Medical History:  Diagnosis Date   Arthritis    Neck, bilateral hands   BACK PAIN, CHRONIC 10/31/2010   Cervical stenosis of spine    Chronic lumbar radiculopathy 05/20/2015   Diastolic dysfunction 05/21/2018   Family history of colon cancer 01-29-13   Father died at 70yo   HYPERLIPIDEMIA 2007-09-14   Impaired glucose tolerance 08/29/2011   PARESTHESIA 14-Sep-2007   Toxic effect of chlorine gas(987.6) 10/31/2010   Past Surgical History:  Procedure Laterality Date   ANTERIOR CERVICAL DECOMP/DISCECTOMY FUSION N/A 08/14/2022   Procedure: ACDF - C2-C3;  Surgeon: Tia Alert, MD;  Location: [facility] OR;  Service: Neurosurgery;  Laterality: N/A;   BIOPSY  10/13/2021   Procedure: BIOPSY;  Surgeon: Napoleon Form, MD;  Location: [facility];  Service: Endoscopy;;   BUNIONECTOMY     CARPAL TUNNEL RELEASE Right 08/14/2022   Procedure: Right carpal tunnel release;  Surgeon: Tia Alert, MD;  Location: [facility] OR;  Service: Neurosurgery;  Laterality: Right;   COLONOSCOPY     COLONOSCOPY WITH PROPOFOL N/A 10/13/2021   Procedure: COLONOSCOPY WITH PROPOFOL;  Surgeon: Napoleon Form, MD;  Location: [facility];  Service: Endoscopy;  Laterality: N/A;   FOOT SURGERY Right    LUMBAR LAMINECTOMY/DECOMPRESSION MICRODISCECTOMY N/A 06/20/2018   Procedure: Laminectomy and  Foraminotomy - Lumbar one-Lumbar two - Lumbar two-Lumbar three - Lumbar three-Lumbar four - Lumbar four-Lumbar five;  Surgeon: Tia Alert, MD;  Location: [facility] OR;  Service: Neurosurgery;  Laterality: N/A;   mass removal     back, forehead; benign (lipoma)    mass removal  2011   head; benign   POLYPECTOMY  10/13/2021   Procedure: POLYPECTOMY;  Surgeon: Napoleon Form, MD;  Location: [facility];  Service: Endoscopy;;   POSTERIOR CERVICAL FUSION/FORAMINOTOMY N/A 01/03/2018   Procedure: Posterior Cervical Fusion with lateral mass fixation - Cervical three - Cervical seven, cervical laminectomy Cervical three-cervical seven;  Surgeon: Tia Alert, MD;  Location: [facility] OR;  Service: Neurosurgery;  Laterality: N/A;   ROOT CANAL     s/p lipoma right scalp posteriorly  2011   ULNAR NERVE TRANSPOSITION Right 08/14/2022   Procedure: Right ulnar release;  Surgeon: Tia Alert, MD;  Location: [facility] OR;  Service: Neurosurgery;  Laterality: Right;   Patient Active Problem List   Diagnosis Date Noted   CHF (congestive heart failure) (HCC) 12/28/2022   S/P cervical spinal fusion 08/14/2022   Muscle atrophy 05/05/2022   COVID-19 virus infection 04/06/2022   Polyp of ascending colon    Polyp of cecum    Polyp of rectum    Degeneration of lumbar intervertebral disc 12/21/2020   Osteoarthritis 12/21/2020   Rheumatoid arthritis (HCC) 12/21/2020  Vitamin D deficiency 08/22/2019   Polyarthralgia 08/22/2019   Hypokalemia 08/22/2019   CKD (chronic kidney disease) stage 3, GFR 30-59 ml/min (HCC) 02/20/2019   Hand joint stiff, unspecified laterality 02/20/2019   S/P lumbar laminectomy 06/20/2018   Diastolic dysfunction 05/21/2018   Peripheral edema 05/17/2018   Dyspnea 05/17/2018   Cervical vertebral fusion 01/03/2018   Congenital spinal stenosis of lumbar region 08/22/2017   Bilateral hand pain 03/24/2017   Elevated blood pressure reading without diagnosis of hypertension 03/24/2017   Grief  reaction 01/02/2017   Nocturia 04/28/2016   Erectile dysfunction 04/28/2016   Chronic lumbar radiculopathy 05/20/2015   Increased prostate specific antigen (PSA) velocity 05/20/2015   Paresthesia of right leg 01/29/2015   Syncope 04/13/2014   EKG abnormality 04/13/2014   Family history of colon cancer 01/17/2013   Allergic rhinitis 09/10/2011   Microhematuria 09/08/2011   Impaired glucose tolerance 08/29/2011   Colon cancer screening 08/29/2011   Backache 10/31/2010   Toxic effect of chlorine gas 10/31/2010   WEAKNESS, RIGHT SIDE OF BODY 02/28/2010   HIP PAIN, RIGHT 12/02/2007   Pain in joint, lower leg 12/02/2007   Pain in Soft Tissues of Limb 12/02/2007   Hyperlipidemia 09/02/2007   Anxiety state 09/02/2007   PARESTHESIA 09/02/2007    PCP: Corwin Levins, MD  REFERRING PROVIDER: Corwin Levins, MD  REFERRING DIAG:  M54.9 (ICD-10-CM) - Bilateral back pain, unspecified back location, unspecified chronicity  M54.2 (ICD-10-CM) - Neck pain    Rationale for Evaluation and Treatment: Rehabilitation  THERAPY DIAG:  Muscle weakness (generalized)  Other low back pain  Other abnormalities of gait and mobility  Unsteadiness on feet  ONSET DATE: 06/21/23 (referral date)  SUBJECTIVE:                                                                                                                                                                                           SUBJECTIVE STATEMENT: Patient goes by "[name]."   Patient repots that overall he is doing well today. He denies falls and near falls. He is worried about sister who was found down in her home and is now in the hospital.   PERTINENT HISTORY:  Anterior Cervical Decompression/discetomy and fusion of C2-C3 (12/23), R carpal tunnel release (12/23), R ulnar nerve release (12/23), lumbar laminectomy/decompression microdiscetomy L1-L5 (2019), posterior cervical fusion/foraminotomy C3-C7 (2019), osteoarthritis of neck and  bilateral hands, chronic back pain, cervical stenosis of spine, chronic lumbar radiculopathy, hyperlipidemia, CHF, CKD stage 3, hx of R foot surgery  PAIN:  Are you having pain?  no Pt reports he cannot sit or walk for too long  PRECAUTIONS: Fall  RED FLAGS: None   WEIGHT BEARING RESTRICTIONS: No  FALLS:  Has patient fallen in last 6 months? No, but plenty of near misses   LIVING ENVIRONMENT: Lives with: lives with their spouse Lives in: House/apartment Stairs: Yes: Internal: 17 steps; on right going up, on left going up, and can reach both and External: 5 steps; on right going up, on left going up, and can reach both Has following equipment at home: Single point cane and Walker - 2 wheeled  OCCUPATION: Retired   PLOF: Independent  PATIENT GOALS: "I just want to work on every thing"   OBJECTIVE:  Note: Objective measures were completed at Evaluation unless otherwise noted.  DIAGNOSTIC FINDINGS:  No new imaging since operations on neck and low back  VITALS   Vitals:   08/17/23 0938  BP: 137/79  Pulse: 68  Seated on RUE at rest, elevated but [facility] for therapy  TODAY'S TREATMENT:         TherEx:   SciFit: - hills, level 9, 8 min on progressive setting for on and off periods - bilat UE/LE, target of > 70 spm on on resistance periods - cardio warmup and strength training progression, RPE 8/10   Progression of HEP:  - Lunge with single UE support 2 x 8 reps (cues for upright chest and wide leg base)   - RPE: 8/10 - Glute tap to low mat stand with march on each side 2 x 10 reps  - Curtsy lunge 1 x 5 bil with UE support  PATIENT EDATION:  Education details: Continue HEP + HEP additions Person educated: Patient Education method: Explanation and Verbal cues Education comprehension: verbalized understanding, verbal cues required, and needs further education  HOME EXERCISE PROGRAM: Access Code: [email or web address] URL: [email or web address] Date:  07/17/2023 Prepared by: Maryruth Eve  Exercises - Sit to Stand Without Arm Support  - 1 x daily - 7 x weekly - 3 sets - 10 reps - Tandem Walking with Counter Support  - 1 x daily - 7 x weekly - 3-4 sets - Side Stepping with Resistance at Thighs and Counter Support  - 1 x daily - 7 x weekly - 3 sets - 10 reps - Forward Backward Monster Walk with Band at [employer] and Counter Support  - 1 x daily - 7 x weekly - 3 sets - 10 reps - Standing Hip Extension with Resistance at Ankles and Counter Support  - 1 x daily - 7 x weekly - 3 sets - 10 reps - Standing Hip Abduction with Resistance at Ankles and Counter Support  - 1 x daily - 7 x weekly - 3 sets - 10 reps - Lunge with Counter Support  - 1 x daily - 7 x weekly - 2-3 sets - 8 reps - Squat with Chair Touch with standing march  - 1 x daily - 7 x weekly - 2-3 sets - 10 reps  BP log and [employer] 6 week beginner walking program  ASSESSMENT:  CLINICAL IMPRESSION: Patient seen for skilled physical therapy session with emphasis on activity tolerance and LE strength progression with strong emphasis on adjusting HEP to appropriate difficulty given patient progress. Number of exercises completed today limited as required time spent on form but patient appropriate and safe with new exercises by end of session. Required most cueing on upright chest with lunge. Continue POC.   OBJECTIVE IMPAIRMENTS: Abnormal gait, decreased activity tolerance, decreased balance, decreased coordination, decreased endurance, decreased knowledge of  condition, decreased knowledge of use of DME, decreased mobility, difficulty walking, decreased strength, impaired flexibility, impaired sensation, impaired UE functional use, improper body mechanics, and pain  ACTIVITY LIMITATIONS: carrying, lifting, bending, sitting, standing, squatting, stairs, transfers, bathing, locomotion level, and caring for others  PARTICIPATION LIMITATIONS: meal prep,  cleaning, laundry, driving, shopping, community activity, and yard work  PERSONAL FACTORS: Age, Fitness, Past/current experiences, and 1 comorbidity: Lumbar laminectomy and C3-C7 fusion  are also affecting patient's functional outcome.    REHAB POTENTIAL: Good  CLINICAL DECISION MAKING: Stable/uncomplicated  EVALUATION COMPLEXITY: Low   GOALS: Goals reviewed with patient? Yes  SHORT TERM GOALS: Target date: 08/09/2023    Pt will be independent with initial HEP for improved strength, balance, transfers and gait.  Baseline: not established on eval  Goal status: MET  2.  Patient will improve FGA to greater than 19/30 to indicate a decreased risk of falls and improved dynamic stability.   Baseline: 17/30;25/30 (11/27) Goal status: MET  3. Patient will improve their 5x Sit to Stand score to less than 20 seconds to demonstrate a decreased risk for falls and improved LE strength.   Baseline: 22.91 seconds without UE support; 14.62s w/o UE support  Goal status: MET  4.  Pt will be compliant w/walking program for improved endurance and safety w/gait  Baseline:  Goal status: MET    LONG TERM GOALS: Target date: 09/06/2023    Pt will be independent with final HEP for improved strength, balance, transfers and gait.  Baseline:  Goal status: INITIAL  2.  Patient will improve FGA to greater than 22/30 to indicate a decreased risk of falls and improved dynamic stability.   Baseline: 17/30; 25/30  Goal status: MET   3.  Patient will improve modified ODI score to 28% impairment or less indicate a clinically important improvement in low back pain.   Baseline: 38% impairment  Goal status: INITIAL  4.  Patient will improve their 5x Sit to Stand score to less than 13 seconds to demonstrate a decreased risk for falls and improved LE strength.   Baseline: 22.91 seconds without UE support; 14.62s w/o UE support (11/27) Goal status: REVISED  5.  Pt will improve gait velocity to at  least 3.2 ft/s  for improved gait efficiency and safety  Baseline: 2.93 ft/s  Goal status: INITIAL   PLAN:  PT FREQUENCY: 2x/week  PT DURATION: 8 weeks  PLANNED INTERVENTIONS: 97164- PT Re-evaluation, 97110-Therapeutic exercises, 97530- Therapeutic activity, 97112- Neuromuscular re-education, 97535- Self Care, [id number]- Manual therapy, [id number]- Gait training, [phone]- Aquatic Therapy, [phone]- Electrical stimulation (manual), Balance training, Stair training, Dry Needling, Joint mobilization, Spinal mobilization, and DME instructions.  PLAN FOR NEXT SESSION: work on dynamic stability challenges requiring modified SLS and directional turns, review waking program, redirect as needed to keep patient on task , hip strength, heel taps    Airiann M Page, SPT  08/17/2023, 10:45 AM

## 2023-08-22 ENCOUNTER — Ambulatory Visit: Payer: Medicare Other | Admitting: Physical Therapy

## 2023-08-22 DIAGNOSIS — M5459 Other low back pain: Secondary | ICD-10-CM

## 2023-08-22 DIAGNOSIS — M542 Cervicalgia: Secondary | ICD-10-CM | POA: Diagnosis not present

## 2023-08-22 DIAGNOSIS — M6281 Muscle weakness (generalized): Secondary | ICD-10-CM

## 2023-08-22 DIAGNOSIS — R2681 Unsteadiness on feet: Secondary | ICD-10-CM

## 2023-08-22 DIAGNOSIS — R29898 Other symptoms and signs involving the musculoskeletal system: Secondary | ICD-10-CM | POA: Diagnosis not present

## 2023-08-22 DIAGNOSIS — R2689 Other abnormalities of gait and mobility: Secondary | ICD-10-CM

## 2023-08-22 NOTE — Therapy (Signed)
OUTPATIENT PHYSICAL THERAPY CERVICAL AND THORACOLUMBAR TREATMENT  Patient Name: DONIS MCAULEY MRN: [id number] DOB:1951/09/08, 72 y.o., male Today's Date: 08/22/2023   END OF SESSION:  PT End of Session - 08/22/23 0931     Visit Number 12    Number of Visits 17    Date for PT Re-Evaluation 09/20/23    Authorization Type Medicare    Progress Note Due on Visit 10    PT Start Time 0930    PT Stop Time 1015    PT Time Calculation (min) 45 min    Equipment Utilized During Treatment Gait belt    Activity Tolerance Patient tolerated treatment well;Patient limited by fatigue    Behavior During Therapy WFL for tasks assessed/performed              Past Medical History:  Diagnosis Date   Arthritis    Neck, bilateral hands   BACK PAIN, CHRONIC 10/31/2010   Cervical stenosis of spine    Chronic lumbar radiculopathy 05/20/2015   Diastolic dysfunction 05/21/2018   Family history of colon cancer 01/31/13   Father died at 70yo   HYPERLIPIDEMIA 09-16-2007   Impaired glucose tolerance 08/29/2011   PARESTHESIA 2007-09-16   Toxic effect of chlorine gas(987.6) 10/31/2010   Past Surgical History:  Procedure Laterality Date   ANTERIOR CERVICAL DECOMP/DISCECTOMY FUSION N/A 08/14/2022   Procedure: ACDF - C2-C3;  Surgeon: Tia Alert, MD;  Location: [facility] OR;  Service: Neurosurgery;  Laterality: N/A;   BIOPSY  10/13/2021   Procedure: BIOPSY;  Surgeon: Napoleon Form, MD;  Location: [facility];  Service: Endoscopy;;   BUNIONECTOMY     CARPAL TUNNEL RELEASE Right 08/14/2022   Procedure: Right carpal tunnel release;  Surgeon: Tia Alert, MD;  Location: [facility] OR;  Service: Neurosurgery;  Laterality: Right;   COLONOSCOPY     COLONOSCOPY WITH PROPOFOL N/A 10/13/2021   Procedure: COLONOSCOPY WITH PROPOFOL;  Surgeon: Napoleon Form, MD;  Location: [facility];  Service: Endoscopy;  Laterality: N/A;   FOOT SURGERY Right    LUMBAR LAMINECTOMY/DECOMPRESSION MICRODISCECTOMY N/A 06/20/2018    Procedure: Laminectomy and Foraminotomy - Lumbar one-Lumbar two - Lumbar two-Lumbar three - Lumbar three-Lumbar four - Lumbar four-Lumbar five;  Surgeon: Tia Alert, MD;  Location: [facility] OR;  Service: Neurosurgery;  Laterality: N/A;   mass removal     back, forehead; benign (lipoma)    mass removal  2011   head; benign   POLYPECTOMY  10/13/2021   Procedure: POLYPECTOMY;  Surgeon: Napoleon Form, MD;  Location: [facility];  Service: Endoscopy;;   POSTERIOR CERVICAL FUSION/FORAMINOTOMY N/A 01/03/2018   Procedure: Posterior Cervical Fusion with lateral mass fixation - Cervical three - Cervical seven, cervical laminectomy Cervical three-cervical seven;  Surgeon: Tia Alert, MD;  Location: [facility] OR;  Service: Neurosurgery;  Laterality: N/A;   ROOT CANAL     s/p lipoma right scalp posteriorly  2011   ULNAR NERVE TRANSPOSITION Right 08/14/2022   Procedure: Right ulnar release;  Surgeon: Tia Alert, MD;  Location: [facility] OR;  Service: Neurosurgery;  Laterality: Right;   Patient Active Problem List   Diagnosis Date Noted   CHF (congestive heart failure) (HCC) 12/28/2022   S/P cervical spinal fusion 08/14/2022   Muscle atrophy 05/05/2022   COVID-19 virus infection 04/06/2022   Polyp of ascending colon    Polyp of cecum    Polyp of rectum    Degeneration of lumbar intervertebral disc 12/21/2020   Osteoarthritis 12/21/2020   Rheumatoid  arthritis (HCC) 12/21/2020   Vitamin D deficiency 08/22/2019   Polyarthralgia 08/22/2019   Hypokalemia 08/22/2019   CKD (chronic kidney disease) stage 3, GFR 30-59 ml/min (HCC) 02/20/2019   Hand joint stiff, unspecified laterality 02/20/2019   S/P lumbar laminectomy 06/20/2018   Diastolic dysfunction 05/21/2018   Peripheral edema 05/17/2018   Dyspnea 05/17/2018   Cervical vertebral fusion 01/03/2018   Congenital spinal stenosis of lumbar region 08/22/2017   Bilateral hand pain 03/24/2017   Elevated blood pressure reading without diagnosis of hypertension  03/24/2017   Grief reaction 01/02/2017   Nocturia 04/28/2016   Erectile dysfunction 04/28/2016   Chronic lumbar radiculopathy 05/20/2015   Increased prostate specific antigen (PSA) velocity 05/20/2015   Paresthesia of right leg 01/29/2015   Syncope 04/13/2014   EKG abnormality 04/13/2014   Family history of colon cancer 01/17/2013   Allergic rhinitis 09/10/2011   Microhematuria 09/08/2011   Impaired glucose tolerance 08/29/2011   Colon cancer screening 08/29/2011   Backache 10/31/2010   Toxic effect of chlorine gas 10/31/2010   WEAKNESS, RIGHT SIDE OF BODY 02/28/2010   HIP PAIN, RIGHT 12/02/2007   Pain in joint, lower leg 12/02/2007   Pain in Soft Tissues of Limb 12/02/2007   Hyperlipidemia 09/02/2007   Anxiety state 09/02/2007   PARESTHESIA 09/02/2007    PCP: Corwin Levins, MD  REFERRING PROVIDER: Corwin Levins, MD  REFERRING DIAG:  M54.9 (ICD-10-CM) - Bilateral back pain, unspecified back location, unspecified chronicity  M54.2 (ICD-10-CM) - Neck pain    Rationale for Evaluation and Treatment: Rehabilitation  THERAPY DIAG:  Muscle weakness (generalized)  Other low back pain  Other abnormalities of gait and mobility  Unsteadiness on feet  ONSET DATE: 06/21/23 (referral date)  SUBJECTIVE:                                                                                                                                                                                           SUBJECTIVE STATEMENT: Patient goes by "[name]."   Pt reports doing well. Denies pain. Has tried his new exercises a few times but has not been walking much due to the weather.    PERTINENT HISTORY:  Anterior Cervical Decompression/discetomy and fusion of C2-C3 (12/23), R carpal tunnel release (12/23), R ulnar nerve release (12/23), lumbar laminectomy/decompression microdiscetomy L1-L5 (2019), posterior cervical fusion/foraminotomy C3-C7 (2019), osteoarthritis of neck and bilateral hands, chronic  back pain, cervical stenosis of spine, chronic lumbar radiculopathy, hyperlipidemia, CHF, CKD stage 3, hx of R foot surgery  PAIN:  Are you having pain?  no Pt reports he cannot sit or walk for too long   PRECAUTIONS: Fall  RED FLAGS: None   WEIGHT BEARING RESTRICTIONS: No  FALLS:  Has patient fallen in last 6 months? No, but plenty of near misses   LIVING ENVIRONMENT: Lives with: lives with their spouse Lives in: House/apartment Stairs: Yes: Internal: 17 steps; on right going up, on left going up, and can reach both and External: 5 steps; on right going up, on left going up, and can reach both Has following equipment at home: Single point cane and Walker - 2 wheeled  OCCUPATION: Retired   PLOF: Independent  PATIENT GOALS: "I just want to work on every thing"   OBJECTIVE:  Note: Objective measures were completed at Evaluation unless otherwise noted.  DIAGNOSTIC FINDINGS:  No new imaging since operations on neck and low back  VITALS   There were no vitals filed for this visit. Seated on RUE at rest, elevated but [facility] for therapy  TODAY'S TREATMENT:         Ther Ex  SciFit multi-peaks level 12 for 8 minutes using BUE/BLEs for neural priming for reciprocal movement, dynamic cardiovascular warmup and increased amplitude of stepping. RPE of 10/10 following activity   NMR  4 Blaze pods on random reach setting for improved single leg stability, LE coordination, functional hip strength and body mechanics. Performed on 2.5 minute intervals with 2-4 minute rest periods.  Pt requires CGA guarding and BUE support. Round 1 & 2:  with RLE on green theraband foam and 4 pods placed to L side (6-12 o'clock) setup.  56 and 62 hits. Mod verbal cues to maintain upright trunk throughout, as pt rounding back frequently on round 1  Round 3 &4:  same setup w/L foot on foam.  49 and 65 hits. RPE following activity: 10/10  Pt very limited by fatigue this date    PATIENT EDATION:  Education  details: Continue HEP  Person educated: Patient Education method: Explanation and Verbal cues Education comprehension: verbalized understanding, verbal cues required, and needs further education  HOME EXERCISE PROGRAM: Access Code: [email or web address] URL: [email or web address] Date: 07/17/2023 Prepared by: Maryruth Eve  Exercises - Sit to Stand Without Arm Support  - 1 x daily - 7 x weekly - 3 sets - 10 reps - Tandem Walking with Counter Support  - 1 x daily - 7 x weekly - 3-4 sets - Side Stepping with Resistance at Thighs and Counter Support  - 1 x daily - 7 x weekly - 3 sets - 10 reps - Forward Backward Monster Walk with Band at Thighs and Counter Support  - 1 x daily - 7 x weekly - 3 sets - 10 reps - Standing Hip Extension with Resistance at Ankles and Counter Support  - 1 x daily - 7 x weekly - 3 sets - 10 reps - Standing Hip Abduction with Resistance at Ankles and Counter Support  - 1 x daily - 7 x weekly - 3 sets - 10 reps - Lunge with Counter Support  - 1 x daily - 7 x weekly - 2-3 sets - 8 reps - Squat with Chair Touch with standing march  - 1 x daily - 7 x weekly - 2-3 sets - 10 reps  BP log and [employer] 6 week beginner walking program  ASSESSMENT:  CLINICAL IMPRESSION: Emphasis of skilled PT session on endurance, functional hip strength and single leg stability. Pt very limited by fatigue this date, stating the Scifit "beat him into the ground". Pt required extra time for seated rest throughout session. Pt  performed well w/blaze pod activity, noting ability to occasionally tap pods without UE support but did require mod multimodal cues to maintain upright posture to reduce strain on low back. Pt denied low back pain w/activity but did report feeling tightness in area w/upright posture. Continue POC.   OBJECTIVE IMPAIRMENTS: Abnormal gait, decreased activity tolerance, decreased balance, decreased coordination, decreased endurance,  decreased knowledge of condition, decreased knowledge of use of DME, decreased mobility, difficulty walking, decreased strength, impaired flexibility, impaired sensation, impaired UE functional use, improper body mechanics, and pain  ACTIVITY LIMITATIONS: carrying, lifting, bending, sitting, standing, squatting, stairs, transfers, bathing, locomotion level, and caring for others  PARTICIPATION LIMITATIONS: meal prep, cleaning, laundry, driving, shopping, community activity, and yard work  PERSONAL FACTORS: Age, Fitness, Past/current experiences, and 1 comorbidity: Lumbar laminectomy and C3-C7 fusion  are also affecting patient's functional outcome.    REHAB POTENTIAL: Good  CLINICAL DECISION MAKING: Stable/uncomplicated  EVALUATION COMPLEXITY: Low   GOALS: Goals reviewed with patient? Yes  SHORT TERM GOALS: Target date: 08/09/2023    Pt will be independent with initial HEP for improved strength, balance, transfers and gait.  Baseline: not established on eval  Goal status: MET  2.  Patient will improve FGA to greater than 19/30 to indicate a decreased risk of falls and improved dynamic stability.   Baseline: 17/30;25/30 (11/27) Goal status: MET  3. Patient will improve their 5x Sit to Stand score to less than 20 seconds to demonstrate a decreased risk for falls and improved LE strength.   Baseline: 22.91 seconds without UE support; 14.62s w/o UE support  Goal status: MET  4.  Pt will be compliant w/walking program for improved endurance and safety w/gait  Baseline:  Goal status: MET    LONG TERM GOALS: Target date: 09/06/2023    Pt will be independent with final HEP for improved strength, balance, transfers and gait.  Baseline:  Goal status: INITIAL  2.  Patient will improve FGA to greater than 22/30 to indicate a decreased risk of falls and improved dynamic stability.   Baseline: 17/30; 25/30  Goal status: MET   3.  Patient will improve modified ODI score to 28%  impairment or less indicate a clinically important improvement in low back pain.   Baseline: 38% impairment  Goal status: INITIAL  4.  Patient will improve their 5x Sit to Stand score to less than 13 seconds to demonstrate a decreased risk for falls and improved LE strength.   Baseline: 22.91 seconds without UE support; 14.62s w/o UE support (11/27) Goal status: REVISED  5.  Pt will improve gait velocity to at least 3.2 ft/s  for improved gait efficiency and safety  Baseline: 2.93 ft/s  Goal status: INITIAL   PLAN:  PT FREQUENCY: 2x/week  PT DURATION: 8 weeks  PLANNED INTERVENTIONS: 97164- PT Re-evaluation, 97110-Therapeutic exercises, 97530- Therapeutic activity, 97112- Neuromuscular re-education, 97535- Self Care, [id number]- Manual therapy, [id number]- Gait training, [phone]- Aquatic Therapy, [phone]- Electrical stimulation (manual), Balance training, Stair training, Dry Needling, Joint mobilization, Spinal mobilization, and DME instructions.  PLAN FOR NEXT SESSION: work on dynamic stability challenges requiring modified SLS and directional turns, review waking program, redirect as needed to keep patient on task , hip strength, heel taps    Gerrica Cygan E Grayden Burley, PT, DPT 08/22/2023, 10:17 AM

## 2023-08-24 ENCOUNTER — Ambulatory Visit: Payer: Medicare Other | Admitting: Physical Therapy

## 2023-08-24 VITALS — BP 147/86 | HR 70

## 2023-08-24 DIAGNOSIS — R2689 Other abnormalities of gait and mobility: Secondary | ICD-10-CM | POA: Diagnosis not present

## 2023-08-24 DIAGNOSIS — R29898 Other symptoms and signs involving the musculoskeletal system: Secondary | ICD-10-CM | POA: Diagnosis not present

## 2023-08-24 DIAGNOSIS — M6281 Muscle weakness (generalized): Secondary | ICD-10-CM | POA: Diagnosis not present

## 2023-08-24 DIAGNOSIS — M5459 Other low back pain: Secondary | ICD-10-CM

## 2023-08-24 DIAGNOSIS — M542 Cervicalgia: Secondary | ICD-10-CM | POA: Diagnosis not present

## 2023-08-24 DIAGNOSIS — R2681 Unsteadiness on feet: Secondary | ICD-10-CM | POA: Diagnosis not present

## 2023-08-24 NOTE — Therapy (Signed)
OUTPATIENT PHYSICAL THERAPY CERVICAL AND THORACOLUMBAR TREATMENT  Patient Name: Matthew Tucker MRN: [id number] DOB:Sep 24, 1950, 72 y.o., male Today's Date: 08/24/2023   END OF SESSION:  PT End of Session - 08/24/23 1018     Visit Number 13    Number of Visits 17    Date for PT Re-Evaluation 09/20/23    Authorization Type Medicare    Progress Note Due on Visit 10    PT Start Time 1016    PT Stop Time 1103    PT Time Calculation (min) 47 min    Equipment Utilized During Treatment Gait belt    Activity Tolerance Patient tolerated treatment well    Behavior During Therapy WFL for tasks assessed/performed              Past Medical History:  Diagnosis Date   Arthritis    Neck, bilateral hands   BACK PAIN, CHRONIC 10/31/2010   Cervical stenosis of spine    Chronic lumbar radiculopathy 05/20/2015   Diastolic dysfunction 05/21/2018   Family history of colon cancer 18-Jan-2013   Father died at 70yo   HYPERLIPIDEMIA 09-03-2007   Impaired glucose tolerance 08/29/2011   PARESTHESIA 2007/09/03   Toxic effect of chlorine gas(987.6) 10/31/2010   Past Surgical History:  Procedure Laterality Date   ANTERIOR CERVICAL DECOMP/DISCECTOMY FUSION N/A 08/14/2022   Procedure: ACDF - C2-C3;  Surgeon: Tia Alert, MD;  Location: [facility] OR;  Service: Neurosurgery;  Laterality: N/A;   BIOPSY  10/13/2021   Procedure: BIOPSY;  Surgeon: Napoleon Form, MD;  Location: [facility];  Service: Endoscopy;;   BUNIONECTOMY     CARPAL TUNNEL RELEASE Right 08/14/2022   Procedure: Right carpal tunnel release;  Surgeon: Tia Alert, MD;  Location: [facility] OR;  Service: Neurosurgery;  Laterality: Right;   COLONOSCOPY     COLONOSCOPY WITH PROPOFOL N/A 10/13/2021   Procedure: COLONOSCOPY WITH PROPOFOL;  Surgeon: Napoleon Form, MD;  Location: [facility];  Service: Endoscopy;  Laterality: N/A;   FOOT SURGERY Right    LUMBAR LAMINECTOMY/DECOMPRESSION MICRODISCECTOMY N/A 06/20/2018   Procedure: Laminectomy and  Foraminotomy - Lumbar one-Lumbar two - Lumbar two-Lumbar three - Lumbar three-Lumbar four - Lumbar four-Lumbar five;  Surgeon: Tia Alert, MD;  Location: [facility] OR;  Service: Neurosurgery;  Laterality: N/A;   mass removal     back, forehead; benign (lipoma)    mass removal  2011   head; benign   POLYPECTOMY  10/13/2021   Procedure: POLYPECTOMY;  Surgeon: Napoleon Form, MD;  Location: [facility];  Service: Endoscopy;;   POSTERIOR CERVICAL FUSION/FORAMINOTOMY N/A 01/03/2018   Procedure: Posterior Cervical Fusion with lateral mass fixation - Cervical three - Cervical seven, cervical laminectomy Cervical three-cervical seven;  Surgeon: Tia Alert, MD;  Location: [facility] OR;  Service: Neurosurgery;  Laterality: N/A;   ROOT CANAL     s/p lipoma right scalp posteriorly  2011   ULNAR NERVE TRANSPOSITION Right 08/14/2022   Procedure: Right ulnar release;  Surgeon: Tia Alert, MD;  Location: [facility] OR;  Service: Neurosurgery;  Laterality: Right;   Patient Active Problem List   Diagnosis Date Noted   CHF (congestive heart failure) (HCC) 12/28/2022   S/P cervical spinal fusion 08/14/2022   Muscle atrophy 05/05/2022   COVID-19 virus infection 04/06/2022   Polyp of ascending colon    Polyp of cecum    Polyp of rectum    Degeneration of lumbar intervertebral disc 12/21/2020   Osteoarthritis 12/21/2020   Rheumatoid arthritis (HCC) 12/21/2020  Vitamin D deficiency 08/22/2019   Polyarthralgia 08/22/2019   Hypokalemia 08/22/2019   CKD (chronic kidney disease) stage 3, GFR 30-59 ml/min (HCC) 02/20/2019   Hand joint stiff, unspecified laterality 02/20/2019   S/P lumbar laminectomy 06/20/2018   Diastolic dysfunction 05/21/2018   Peripheral edema 05/17/2018   Dyspnea 05/17/2018   Cervical vertebral fusion 01/03/2018   Congenital spinal stenosis of lumbar region 08/22/2017   Bilateral hand pain 03/24/2017   Elevated blood pressure reading without diagnosis of hypertension 03/24/2017   Grief  reaction 01/02/2017   Nocturia 04/28/2016   Erectile dysfunction 04/28/2016   Chronic lumbar radiculopathy 05/20/2015   Increased prostate specific antigen (PSA) velocity 05/20/2015   Paresthesia of right leg 01/29/2015   Syncope 04/13/2014   EKG abnormality 04/13/2014   Family history of colon cancer 01/17/2013   Allergic rhinitis 09/10/2011   Microhematuria 09/08/2011   Impaired glucose tolerance 08/29/2011   Colon cancer screening 08/29/2011   Backache 10/31/2010   Toxic effect of chlorine gas 10/31/2010   WEAKNESS, RIGHT SIDE OF BODY 02/28/2010   HIP PAIN, RIGHT 12/02/2007   Pain in joint, lower leg 12/02/2007   Pain in Soft Tissues of Limb 12/02/2007   Hyperlipidemia 09/02/2007   Anxiety state 09/02/2007   PARESTHESIA 09/02/2007    PCP: Corwin Levins, MD  REFERRING PROVIDER: Corwin Levins, MD  REFERRING DIAG:  M54.9 (ICD-10-CM) - Bilateral back pain, unspecified back location, unspecified chronicity  M54.2 (ICD-10-CM) - Neck pain    Rationale for Evaluation and Treatment: Rehabilitation  THERAPY DIAG:  Muscle weakness (generalized)  Other low back pain  Other abnormalities of gait and mobility  ONSET DATE: 06/21/23 (referral date)  SUBJECTIVE:                                                                                                                                                                                           SUBJECTIVE STATEMENT: Patient goes by "Matthew Tucker."   Pt reports doing well. Denies pain. Was "beaten down" after last session but would like to try the blaze pods again. No falls    PERTINENT HISTORY:  Anterior Cervical Decompression/discetomy and fusion of C2-C3 (12/23), R carpal tunnel release (12/23), R ulnar nerve release (12/23), lumbar laminectomy/decompression microdiscetomy L1-L5 (2019), posterior cervical fusion/foraminotomy C3-C7 (2019), osteoarthritis of neck and bilateral hands, chronic back pain, cervical stenosis of spine,  chronic lumbar radiculopathy, hyperlipidemia, CHF, CKD stage 3, hx of R foot surgery  PAIN:  Are you having pain?  no Pt reports he cannot sit or walk for too long   PRECAUTIONS: Fall  RED FLAGS: None   WEIGHT BEARING RESTRICTIONS: No  FALLS:  Has patient fallen in last 6 months? No, but plenty of near misses   LIVING ENVIRONMENT: Lives with: lives with their spouse Lives in: House/apartment Stairs: Yes: Internal: 17 steps; on right going up, on left going up, and can reach both and External: 5 steps; on right going up, on left going up, and can reach both Has following equipment at home: Single point cane and Walker - 2 wheeled  OCCUPATION: Retired   PLOF: Independent  PATIENT GOALS: "I just want to work on every thing"   OBJECTIVE:  Note: Objective measures were completed at Evaluation unless otherwise noted.  DIAGNOSTIC FINDINGS:  No new imaging since operations on neck and low back  VITALS   Vitals:   08/24/23 1021  BP: (!) 147/86  Pulse: 70     TODAY'S TREATMENT:         Ther Act  Assessed BP (see above) and systolic elevated but within limits for therapy   Ther Ex  SciFit multi-peaks level 12 for 8 minutes using BUE/BLEs for neural priming for reciprocal movement, dynamic cardiovascular warmup and increased amplitude of stepping. RPE of 10/10 following activity  Quadruped bird dogs, x5 per side, for improved posterior chain strength and functional core strength. Pt reported discomfort in low back w/activity, so mod cues to activate TA and pt reported improved comfort but increased difficulty of movement. SBA for safety    NMR  Per pt request, 4 Blaze pods on random reach setting for improved single leg stability, LE coordination, functional hip strength and body mechanics. Performed on 2.5 minute intervals with 5 minute rest periods.  Pt requires CGA guarding and BUE support. Round 1:  with RLE on green dynadisc and 4 pods placed to L side (6-12 o'clock)  setup.  57 hits.  Round 2:  same setup on opposite side w/L foot on dynadisc.  81 hits. RPE following activity: 9/10  Min cues to ensure proper ankle positioning throughout and upright posture    PATIENT EDATION:  Education details: Continue HEP  Person educated: Patient Education method: Explanation and Verbal cues Education comprehension: verbalized understanding, verbal cues required, and needs further education  HOME EXERCISE PROGRAM: Access Code: [email or web address] URL: [email or web address] Date: 07/17/2023 Prepared by: Maryruth Eve  Exercises - Sit to Stand Without Arm Support  - 1 x daily - 7 x weekly - 3 sets - 10 reps - Tandem Walking with Counter Support  - 1 x daily - 7 x weekly - 3-4 sets - Side Stepping with Resistance at Thighs and Counter Support  - 1 x daily - 7 x weekly - 3 sets - 10 reps - Forward Backward Monster Walk with Band at Thighs and Counter Support  - 1 x daily - 7 x weekly - 3 sets - 10 reps - Standing Hip Extension with Resistance at Ankles and Counter Support  - 1 x daily - 7 x weekly - 3 sets - 10 reps - Standing Hip Abduction with Resistance at Ankles and Counter Support  - 1 x daily - 7 x weekly - 3 sets - 10 reps - Lunge with Counter Support  - 1 x daily - 7 x weekly - 2-3 sets - 8 reps - Squat with Chair Touch with standing march  - 1 x daily - 7 x weekly - 2-3 sets - 10 reps  BP log and [employer] 6 week beginner walking program  ASSESSMENT:  CLINICAL IMPRESSION: Emphasis of skilled PT session on  functional core stability, single leg stability and hip strength. Pt reports he was very challenged by previous session but enjoyed it and wanted to work on the same activity today. Did progress the blaze pods task today and pt tolerated well, but still requires lengthy seated rest breaks due to fatigue. Pt requires mod multimodal cues to properly engage core w/activity to alleviate low back discomfort but overall  has improved since starting therapy. Continue POC.   OBJECTIVE IMPAIRMENTS: Abnormal gait, decreased activity tolerance, decreased balance, decreased coordination, decreased endurance, decreased knowledge of condition, decreased knowledge of use of DME, decreased mobility, difficulty walking, decreased strength, impaired flexibility, impaired sensation, impaired UE functional use, improper body mechanics, and pain  ACTIVITY LIMITATIONS: carrying, lifting, bending, sitting, standing, squatting, stairs, transfers, bathing, locomotion level, and caring for others  PARTICIPATION LIMITATIONS: meal prep, cleaning, laundry, driving, shopping, community activity, and yard work  PERSONAL FACTORS: Age, Fitness, Past/current experiences, and 1 comorbidity: Lumbar laminectomy and C3-C7 fusion  are also affecting patient's functional outcome.    REHAB POTENTIAL: Good  CLINICAL DECISION MAKING: Stable/uncomplicated  EVALUATION COMPLEXITY: Low   GOALS: Goals reviewed with patient? Yes  SHORT TERM GOALS: Target date: 08/09/2023    Pt will be independent with initial HEP for improved strength, balance, transfers and gait.  Baseline: not established on eval  Goal status: MET  2.  Patient will improve FGA to greater than 19/30 to indicate a decreased risk of falls and improved dynamic stability.   Baseline: 17/30;25/30 (11/27) Goal status: MET  3. Patient will improve their 5x Sit to Stand score to less than 20 seconds to demonstrate a decreased risk for falls and improved LE strength.   Baseline: 22.91 seconds without UE support; 14.62s w/o UE support  Goal status: MET  4.  Pt will be compliant w/walking program for improved endurance and safety w/gait  Baseline:  Goal status: MET    LONG TERM GOALS: Target date: 09/06/2023    Pt will be independent with final HEP for improved strength, balance, transfers and gait.  Baseline:  Goal status: INITIAL  2.  Patient will improve FGA to  greater than 22/30 to indicate a decreased risk of falls and improved dynamic stability.   Baseline: 17/30; 25/30  Goal status: MET   3.  Patient will improve modified ODI score to 28% impairment or less indicate a clinically important improvement in low back pain.   Baseline: 38% impairment  Goal status: INITIAL  4.  Patient will improve their 5x Sit to Stand score to less than 13 seconds to demonstrate a decreased risk for falls and improved LE strength.   Baseline: 22.91 seconds without UE support; 14.62s w/o UE support (11/27) Goal status: REVISED  5.  Pt will improve gait velocity to at least 3.2 ft/s  for improved gait efficiency and safety  Baseline: 2.93 ft/s  Goal status: INITIAL   PLAN:  PT FREQUENCY: 2x/week  PT DURATION: 8 weeks  PLANNED INTERVENTIONS: 97164- PT Re-evaluation, 97110-Therapeutic exercises, 97530- Therapeutic activity, 97112- Neuromuscular re-education, 97535- Self Care, [id number]- Manual therapy, [id number]- Gait training, [phone]- Aquatic Therapy, [phone]- Electrical stimulation (manual), Balance training, Stair training, Dry Needling, Joint mobilization, Spinal mobilization, and DME instructions.  PLAN FOR NEXT SESSION: work on dynamic stability challenges requiring modified SLS and directional turns, review waking program, redirect as needed to keep patient on task , hip strength, heel taps    Cougar Imel E Javohn Basey, PT, DPT 08/24/2023, 11:53 AM

## 2023-08-29 ENCOUNTER — Ambulatory Visit: Payer: Medicare Other | Admitting: Physical Therapy

## 2023-08-29 DIAGNOSIS — R2681 Unsteadiness on feet: Secondary | ICD-10-CM | POA: Diagnosis not present

## 2023-08-29 DIAGNOSIS — R2689 Other abnormalities of gait and mobility: Secondary | ICD-10-CM | POA: Diagnosis not present

## 2023-08-29 DIAGNOSIS — M542 Cervicalgia: Secondary | ICD-10-CM | POA: Diagnosis not present

## 2023-08-29 DIAGNOSIS — M5459 Other low back pain: Secondary | ICD-10-CM | POA: Diagnosis not present

## 2023-08-29 DIAGNOSIS — M6281 Muscle weakness (generalized): Secondary | ICD-10-CM

## 2023-08-29 DIAGNOSIS — R29898 Other symptoms and signs involving the musculoskeletal system: Secondary | ICD-10-CM | POA: Diagnosis not present

## 2023-08-29 NOTE — Therapy (Signed)
OUTPATIENT PHYSICAL THERAPY CERVICAL AND THORACOLUMBAR TREATMENT  Patient Name: Matthew Tucker MRN: [id number] DOB:[birth year], 72 y.o., male Today's Date: 08/29/2023   END OF SESSION:  PT End of Session - 08/29/23 0932     Visit Number 14    Number of Visits 17    Date for PT Re-Evaluation 09/20/23    Authorization Type Medicare    Progress Note Due on Visit 10    PT Start Time 0930    PT Stop Time 1014    PT Time Calculation (min) 44 min    Equipment Utilized During Treatment --    Activity Tolerance Patient tolerated treatment well    Behavior During Therapy [facility] for tasks assessed/performed               Past Medical History:  Diagnosis Date   Arthritis    Neck, bilateral hands   BACK PAIN, CHRONIC 10/31/2010   Cervical stenosis of spine    Chronic lumbar radiculopathy 05/20/2015   Diastolic dysfunction 05/21/2018   Family history of colon cancer 2013/02/04   Father died at 70yo   HYPERLIPIDEMIA 09/20/07   Impaired glucose tolerance 08/29/2011   PARESTHESIA 20-Sep-2007   Toxic effect of chlorine gas(987.6) 10/31/2010   Past Surgical History:  Procedure Laterality Date   ANTERIOR CERVICAL DECOMP/DISCECTOMY FUSION N/A 08/14/2022   Procedure: ACDF - C2-C3;  Surgeon: Tia Alert, MD;  Location: [facility] OR;  Service: Neurosurgery;  Laterality: N/A;   BIOPSY  10/13/2021   Procedure: BIOPSY;  Surgeon: Napoleon Form, MD;  Location: [facility];  Service: Endoscopy;;   BUNIONECTOMY     CARPAL TUNNEL RELEASE Right 08/14/2022   Procedure: Right carpal tunnel release;  Surgeon: Tia Alert, MD;  Location: [facility] OR;  Service: Neurosurgery;  Laterality: Right;   COLONOSCOPY     COLONOSCOPY WITH PROPOFOL N/A 10/13/2021   Procedure: COLONOSCOPY WITH PROPOFOL;  Surgeon: Napoleon Form, MD;  Location: [facility];  Service: Endoscopy;  Laterality: N/A;   FOOT SURGERY Right    LUMBAR LAMINECTOMY/DECOMPRESSION MICRODISCECTOMY N/A 06/20/2018   Procedure: Laminectomy and  Foraminotomy - Lumbar one-Lumbar two - Lumbar two-Lumbar three - Lumbar three-Lumbar four - Lumbar four-Lumbar five;  Surgeon: Tia Alert, MD;  Location: [facility] OR;  Service: Neurosurgery;  Laterality: N/A;   mass removal     back, forehead; benign (lipoma)    mass removal  2011   head; benign   POLYPECTOMY  10/13/2021   Procedure: POLYPECTOMY;  Surgeon: Napoleon Form, MD;  Location: [facility];  Service: Endoscopy;;   POSTERIOR CERVICAL FUSION/FORAMINOTOMY N/A 01/03/2018   Procedure: Posterior Cervical Fusion with lateral mass fixation - Cervical three - Cervical seven, cervical laminectomy Cervical three-cervical seven;  Surgeon: Tia Alert, MD;  Location: [facility] OR;  Service: Neurosurgery;  Laterality: N/A;   ROOT CANAL     s/p lipoma right scalp posteriorly  2011   ULNAR NERVE TRANSPOSITION Right 08/14/2022   Procedure: Right ulnar release;  Surgeon: Tia Alert, MD;  Location: [facility] OR;  Service: Neurosurgery;  Laterality: Right;   Patient Active Problem List   Diagnosis Date Noted   CHF (congestive heart failure) (HCC) 12/28/2022   S/P cervical spinal fusion 08/14/2022   Muscle atrophy 05/05/2022   COVID-19 virus infection 04/06/2022   Polyp of ascending colon    Polyp of cecum    Polyp of rectum    Degeneration of lumbar intervertebral disc 12/21/2020   Osteoarthritis 12/21/2020   Rheumatoid arthritis (HCC) 12/21/2020  Vitamin D deficiency 08/22/2019   Polyarthralgia 08/22/2019   Hypokalemia 08/22/2019   CKD (chronic kidney disease) stage 3, GFR 30-59 ml/min (HCC) 02/20/2019   Hand joint stiff, unspecified laterality 02/20/2019   S/P lumbar laminectomy 06/20/2018   Diastolic dysfunction 05/21/2018   Peripheral edema 05/17/2018   Dyspnea 05/17/2018   Cervical vertebral fusion 01/03/2018   Congenital spinal stenosis of lumbar region 08/22/2017   Bilateral hand pain 03/24/2017   Elevated blood pressure reading without diagnosis of hypertension 03/24/2017   Grief  reaction 01/02/2017   Nocturia 04/28/2016   Erectile dysfunction 04/28/2016   Chronic lumbar radiculopathy 05/20/2015   Increased prostate specific antigen (PSA) velocity 05/20/2015   Paresthesia of right leg 01/29/2015   Syncope 04/13/2014   EKG abnormality 04/13/2014   Family history of colon cancer 01/17/2013   Allergic rhinitis 09/10/2011   Microhematuria 09/08/2011   Impaired glucose tolerance 08/29/2011   Colon cancer screening 08/29/2011   Backache 10/31/2010   Toxic effect of chlorine gas 10/31/2010   WEAKNESS, RIGHT SIDE OF BODY 02/28/2010   HIP PAIN, RIGHT 12/02/2007   Pain in joint, lower leg 12/02/2007   Pain in Soft Tissues of Limb 12/02/2007   Hyperlipidemia 09/02/2007   Anxiety state 09/02/2007   PARESTHESIA 09/02/2007    PCP: Corwin Levins, MD  REFERRING PROVIDER: Corwin Levins, MD  REFERRING DIAG:  M54.9 (ICD-10-CM) - Bilateral back pain, unspecified back location, unspecified chronicity  M54.2 (ICD-10-CM) - Neck pain    Rationale for Evaluation and Treatment: Rehabilitation  THERAPY DIAG:  Muscle weakness (generalized)  Other low back pain  Other abnormalities of gait and mobility  ONSET DATE: 06/21/23 (referral date)  SUBJECTIVE:                                                                                                                                                                                           SUBJECTIVE STATEMENT: Patient goes by "Matthew Tucker."   Pt reports doing well. Denies pain. Wants to take a nap today.    PERTINENT HISTORY:  Anterior Cervical Decompression/discetomy and fusion of C2-C3 (12/23), R carpal tunnel release (12/23), R ulnar nerve release (12/23), lumbar laminectomy/decompression microdiscetomy L1-L5 (2019), posterior cervical fusion/foraminotomy C3-C7 (2019), osteoarthritis of neck and bilateral hands, chronic back pain, cervical stenosis of spine, chronic lumbar radiculopathy, hyperlipidemia, CHF, CKD stage 3, hx of  R foot surgery  PAIN:  Are you having pain?  no Pt reports he cannot sit or walk for too long   PRECAUTIONS: Fall  RED FLAGS: None   WEIGHT BEARING RESTRICTIONS: No  FALLS:  Has patient fallen in last 6 months? No, but  plenty of near misses   LIVING ENVIRONMENT: Lives with: lives with their spouse Lives in: House/apartment Stairs: Yes: Internal: 17 steps; on right going up, on left going up, and can reach both and External: 5 steps; on right going up, on left going up, and can reach both Has following equipment at home: Single point cane and Walker - 2 wheeled  OCCUPATION: Retired   PLOF: Independent  PATIENT GOALS: "I just want to work on every thing"   OBJECTIVE:  Note: Objective measures were completed at Evaluation unless otherwise noted.  DIAGNOSTIC FINDINGS:  No new imaging since operations on neck and low back  VITALS   There were no vitals filed for this visit.    TODAY'S TREATMENT:         Ther Ex  SciFit ramp-up level 8 for 8 minutes immediately following by 2 minutes of sprint intervals on level 18 using BUE/BLEs for neural priming for reciprocal movement, dynamic cardiovascular warmup and increased amplitude of stepping. RPE of 10/10 following activity.  Cat cows on mat table, x10 reps, for improved spinal mobility. Pt w/increased discomfort w/spinal extension.  Prone press ups w/2 pillows under pelvis for improved spinal extension ROM and low back pain modulation, x10 reps w/10s hold. Added to HEP (See bolded below). Pt denied pain w/movement but did report tightness.  Supine single knee to chest w/sciatic nerve glide, x8 reps per side. Pt very sensitive on RLE, requiring mod multimodal cues to avoid extreme ROM that caused pain. Pt then requested to try double knee to chest stretch, x3 reps. Ended w/supine LTRs, x10 reps, to stretch out low back as pt reported tightness.     PATIENT EDATION:  Education details: Additions to HEP, discussed plan to  recert and add more visits at end of POC.  Person educated: Patient Education method: Explanation, Demonstration, Verbal cues, and Handouts Education comprehension: verbalized understanding, returned demonstration, verbal cues required, and needs further education  HOME EXERCISE PROGRAM: Access Code: [email or web address] URL: [email or web address] Date: 07/17/2023 Prepared by: Maryruth Eve  Exercises - Sit to Stand Without Arm Support  - 1 x daily - 7 x weekly - 3 sets - 10 reps - Tandem Walking with Counter Support  - 1 x daily - 7 x weekly - 3-4 sets - Side Stepping with Resistance at Thighs and Counter Support  - 1 x daily - 7 x weekly - 3 sets - 10 reps - Forward Backward Monster Walk with Band at Thighs and Counter Support  - 1 x daily - 7 x weekly - 3 sets - 10 reps - Standing Hip Extension with Resistance at Ankles and Counter Support  - 1 x daily - 7 x weekly - 3 sets - 10 reps - Standing Hip Abduction with Resistance at Ankles and Counter Support  - 1 x daily - 7 x weekly - 3 sets - 10 reps - Lunge with Counter Support  - 1 x daily - 7 x weekly - 2-3 sets - 8 reps - Squat with Chair Touch with standing march  - 1 x daily - 7 x weekly - 2-3 sets - 10 reps - Prone Press Up  - 1 x daily - 7 x weekly - 3 sets - 10 reps  BP log and [employer] 6 week beginner walking program  ASSESSMENT:  CLINICAL IMPRESSION: Emphasis of skilled PT session on endurance, spinal mobility and sciatic nerve glides. Pt requested to add 2 more minutes to Scifit this date  to work on cardiovascular endurance and leg strength, as he feels the bike is very beneficial for him. Gently introduced spinal extension today as pt has flexion preference. Pt challenged by cat cows but tolerated prone press ups well when cued to perform gently, so added to HEP. Pt very symptomatic w/sciatic nerve glides, so will need to continue to progress in therapy. Continue POC.   OBJECTIVE  IMPAIRMENTS: Abnormal gait, decreased activity tolerance, decreased balance, decreased coordination, decreased endurance, decreased knowledge of condition, decreased knowledge of use of DME, decreased mobility, difficulty walking, decreased strength, impaired flexibility, impaired sensation, impaired UE functional use, improper body mechanics, and pain  ACTIVITY LIMITATIONS: carrying, lifting, bending, sitting, standing, squatting, stairs, transfers, bathing, locomotion level, and caring for others  PARTICIPATION LIMITATIONS: meal prep, cleaning, laundry, driving, shopping, community activity, and yard work  PERSONAL FACTORS: Age, Fitness, Past/current experiences, and 1 comorbidity: Lumbar laminectomy and C3-C7 fusion  are also affecting patient's functional outcome.    REHAB POTENTIAL: Good  CLINICAL DECISION MAKING: Stable/uncomplicated  EVALUATION COMPLEXITY: Low   GOALS: Goals reviewed with patient? Yes  SHORT TERM GOALS: Target date: 08/09/2023    Pt will be independent with initial HEP for improved strength, balance, transfers and gait.  Baseline: not established on eval  Goal status: MET  2.  Patient will improve FGA to greater than 19/30 to indicate a decreased risk of falls and improved dynamic stability.   Baseline: 17/30;25/30 (11/27) Goal status: MET  3. Patient will improve their 5x Sit to Stand score to less than 20 seconds to demonstrate a decreased risk for falls and improved LE strength.   Baseline: 22.91 seconds without UE support; 14.62s w/o UE support  Goal status: MET  4.  Pt will be compliant w/walking program for improved endurance and safety w/gait  Baseline:  Goal status: MET    LONG TERM GOALS: Target date: 09/06/2023    Pt will be independent with final HEP for improved strength, balance, transfers and gait.  Baseline:  Goal status: INITIAL  2.  Patient will improve FGA to greater than 22/30 to indicate a decreased risk of falls and  improved dynamic stability.   Baseline: 17/30; 25/30  Goal status: MET   3.  Patient will improve modified ODI score to 28% impairment or less indicate a clinically important improvement in low back pain.   Baseline: 38% impairment  Goal status: INITIAL  4.  Patient will improve their 5x Sit to Stand score to less than 13 seconds to demonstrate a decreased risk for falls and improved LE strength.   Baseline: 22.91 seconds without UE support; 14.62s w/o UE support (11/27) Goal status: REVISED  5.  Pt will improve gait velocity to at least 3.2 ft/s  for improved gait efficiency and safety  Baseline: 2.93 ft/s  Goal status: INITIAL   PLAN:  PT FREQUENCY: 2x/week  PT DURATION: 8 weeks  PLANNED INTERVENTIONS: 97164- PT Re-evaluation, 97110-Therapeutic exercises, 97530- Therapeutic activity, 97112- Neuromuscular re-education, 97535- Self Care, [id number]- Manual therapy, [id number]- Gait training, [phone]- Aquatic Therapy, [phone]- Electrical stimulation (manual), Balance training, Stair training, Dry Needling, Joint mobilization, Spinal mobilization, and DME instructions.  PLAN FOR NEXT SESSION: work on dynamic stability challenges requiring modified SLS and directional turns, review waking program, redirect as needed to keep patient on task , hip strength, heel taps   *Pt added more appointments on 12/18. Alethia Berthold can do the recert    Jill Alexanders Ekta Dancer, PT, DPT 08/29/2023, 10:16 AM

## 2023-08-31 ENCOUNTER — Ambulatory Visit: Payer: Medicare Other | Admitting: Physical Therapy

## 2023-08-31 ENCOUNTER — Encounter: Payer: Self-pay | Admitting: Physical Therapy

## 2023-08-31 VITALS — BP 146/72 | HR 67

## 2023-08-31 DIAGNOSIS — R2689 Other abnormalities of gait and mobility: Secondary | ICD-10-CM | POA: Diagnosis not present

## 2023-08-31 DIAGNOSIS — M5459 Other low back pain: Secondary | ICD-10-CM | POA: Diagnosis not present

## 2023-08-31 DIAGNOSIS — M6281 Muscle weakness (generalized): Secondary | ICD-10-CM | POA: Diagnosis not present

## 2023-08-31 DIAGNOSIS — R2681 Unsteadiness on feet: Secondary | ICD-10-CM | POA: Diagnosis not present

## 2023-08-31 DIAGNOSIS — R29898 Other symptoms and signs involving the musculoskeletal system: Secondary | ICD-10-CM | POA: Diagnosis not present

## 2023-08-31 DIAGNOSIS — M542 Cervicalgia: Secondary | ICD-10-CM | POA: Diagnosis not present

## 2023-08-31 NOTE — Therapy (Signed)
OUTPATIENT PHYSICAL THERAPY CERVICAL AND THORACOLUMBAR TREATMENT / RE-CERT  Patient Name: Matthew Tucker MRN: [id number] DOB:Feb 05, 1951, 72 y.o., male Today's Date: 08/31/2023   END OF SESSION:  PT End of Session - 08/31/23 1020     Visit Number 15    Number of Visits 29    Date for PT Re-Evaluation 10/26/23    Authorization Type Medicare    Progress Note Due on Visit 10    PT Start Time 1019    PT Stop Time 1100    PT Time Calculation (min) 41 min    Equipment Utilized During Treatment Gait belt    Activity Tolerance Patient tolerated treatment well    Behavior During Therapy WFL for tasks assessed/performed             Past Medical History:  Diagnosis Date   Arthritis    Neck, bilateral hands   BACK PAIN, CHRONIC 10/31/2010   Cervical stenosis of spine    Chronic lumbar radiculopathy 05/20/2015   Diastolic dysfunction 05/21/2018   Family history of colon cancer 2013/02/09   Father died at 70yo   HYPERLIPIDEMIA 09/25/07   Impaired glucose tolerance 08/29/2011   PARESTHESIA 09/25/2007   Toxic effect of chlorine gas(987.6) 10/31/2010   Past Surgical History:  Procedure Laterality Date   ANTERIOR CERVICAL DECOMP/DISCECTOMY FUSION N/A 08/14/2022   Procedure: ACDF - C2-C3;  Surgeon: Tia Alert, MD;  Location: [facility] OR;  Service: Neurosurgery;  Laterality: N/A;   BIOPSY  10/13/2021   Procedure: BIOPSY;  Surgeon: Napoleon Form, MD;  Location: [facility];  Service: Endoscopy;;   BUNIONECTOMY     CARPAL TUNNEL RELEASE Right 08/14/2022   Procedure: Right carpal tunnel release;  Surgeon: Tia Alert, MD;  Location: [facility] OR;  Service: Neurosurgery;  Laterality: Right;   COLONOSCOPY     COLONOSCOPY WITH PROPOFOL N/A 10/13/2021   Procedure: COLONOSCOPY WITH PROPOFOL;  Surgeon: Napoleon Form, MD;  Location: [facility];  Service: Endoscopy;  Laterality: N/A;   FOOT SURGERY Right    LUMBAR LAMINECTOMY/DECOMPRESSION MICRODISCECTOMY N/A 06/20/2018   Procedure:  Laminectomy and Foraminotomy - Lumbar one-Lumbar two - Lumbar two-Lumbar three - Lumbar three-Lumbar four - Lumbar four-Lumbar five;  Surgeon: Tia Alert, MD;  Location: [facility] OR;  Service: Neurosurgery;  Laterality: N/A;   mass removal     back, forehead; benign (lipoma)    mass removal  2011   head; benign   POLYPECTOMY  10/13/2021   Procedure: POLYPECTOMY;  Surgeon: Napoleon Form, MD;  Location: [facility];  Service: Endoscopy;;   POSTERIOR CERVICAL FUSION/FORAMINOTOMY N/A 01/03/2018   Procedure: Posterior Cervical Fusion with lateral mass fixation - Cervical three - Cervical seven, cervical laminectomy Cervical three-cervical seven;  Surgeon: Tia Alert, MD;  Location: [facility] OR;  Service: Neurosurgery;  Laterality: N/A;   ROOT CANAL     s/p lipoma right scalp posteriorly  2011   ULNAR NERVE TRANSPOSITION Right 08/14/2022   Procedure: Right ulnar release;  Surgeon: Tia Alert, MD;  Location: [facility] OR;  Service: Neurosurgery;  Laterality: Right;   Patient Active Problem List   Diagnosis Date Noted   CHF (congestive heart failure) (HCC) 12/28/2022   S/P cervical spinal fusion 08/14/2022   Muscle atrophy 05/05/2022   COVID-19 virus infection 04/06/2022   Polyp of ascending colon    Polyp of cecum    Polyp of rectum    Degeneration of lumbar intervertebral disc 12/21/2020   Osteoarthritis 12/21/2020   Rheumatoid arthritis (HCC)  12/21/2020   Vitamin D deficiency 08/22/2019   Polyarthralgia 08/22/2019   Hypokalemia 08/22/2019   CKD (chronic kidney disease) stage 3, GFR 30-59 ml/min (HCC) 02/20/2019   Hand joint stiff, unspecified laterality 02/20/2019   S/P lumbar laminectomy 06/20/2018   Diastolic dysfunction 05/21/2018   Peripheral edema 05/17/2018   Dyspnea 05/17/2018   Cervical vertebral fusion 01/03/2018   Congenital spinal stenosis of lumbar region 08/22/2017   Bilateral hand pain 03/24/2017   Elevated blood pressure reading without diagnosis of hypertension 03/24/2017    Grief reaction 01/02/2017   Nocturia 04/28/2016   Erectile dysfunction 04/28/2016   Chronic lumbar radiculopathy 05/20/2015   Increased prostate specific antigen (PSA) velocity 05/20/2015   Paresthesia of right leg 01/29/2015   Syncope 04/13/2014   EKG abnormality 04/13/2014   Family history of colon cancer 01/17/2013   Allergic rhinitis 09/10/2011   Microhematuria 09/08/2011   Impaired glucose tolerance 08/29/2011   Colon cancer screening 08/29/2011   Backache 10/31/2010   Toxic effect of chlorine gas 10/31/2010   WEAKNESS, RIGHT SIDE OF BODY 02/28/2010   HIP PAIN, RIGHT 12/02/2007   Pain in joint, lower leg 12/02/2007   Pain in Soft Tissues of Limb 12/02/2007   Hyperlipidemia 09/02/2007   Anxiety state 09/02/2007   PARESTHESIA 09/02/2007    PCP: Corwin Levins, MD  REFERRING PROVIDER: Corwin Levins, MD  REFERRING DIAG:  M54.9 (ICD-10-CM) - Bilateral back pain, unspecified back location, unspecified chronicity  M54.2 (ICD-10-CM) - Neck pain    Rationale for Evaluation and Treatment: Rehabilitation  THERAPY DIAG:  Muscle weakness (generalized) - Plan: PT plan of care cert/re-cert  Other low back pain - Plan: PT plan of care cert/re-cert  Other abnormalities of gait and mobility - Plan: PT plan of care cert/re-cert  Unsteadiness on feet - Plan: PT plan of care cert/re-cert  ONSET DATE: 06/21/23 (referral date)  SUBJECTIVE:                                                                                                                                                                                           SUBJECTIVE STATEMENT: Patient goes by "[name]."   Patient reports that he had a rough day at the dentist yesterday, having to get a crown. Denies falls and near falls. No other acute changes.   PERTINENT HISTORY:  Anterior Cervical Decompression/discetomy and fusion of C2-C3 (12/23), R carpal tunnel release (12/23), R ulnar nerve release (12/23), lumbar  laminectomy/decompression microdiscetomy L1-L5 (2019), posterior cervical fusion/foraminotomy C3-C7 (2019), osteoarthritis of neck and bilateral hands, chronic back pain, cervical stenosis of spine, chronic lumbar radiculopathy, hyperlipidemia, CHF, CKD stage 3, hx of  R foot surgery  PAIN:  Are you having pain?  no  - at rest Pt reports he cannot sit or walk for too long   PRECAUTIONS: Fall  RED FLAGS: None   WEIGHT BEARING RESTRICTIONS: No  FALLS:  Has patient fallen in last 6 months? No, but plenty of near misses   LIVING ENVIRONMENT: Lives with: lives with their spouse Lives in: House/apartment Stairs: Yes: Internal: 17 steps; on right going up, on left going up, and can reach both and External: 5 steps; on right going up, on left going up, and can reach both Has following equipment at home: Single point cane and Walker - 2 wheeled  OCCUPATION: Retired   PLOF: Independent  PATIENT GOALS: "I just want to work on every thing"   OBJECTIVE:  Note: Objective measures were completed at Evaluation unless otherwise noted.  DIAGNOSTIC FINDINGS:  No new imaging since operations on neck and low back  VITALS   Vitals:   08/31/23 1026  BP: (!) 146/72  Pulse: 67   Seated on RUE  TODAY'S TREATMENT:         Ther Ex: Nustep x 5 min at level 10 for cardiorespiratory conditioning, neural priming, and mobility/strength work - RPE: 9/10  Slump nerve tensioner 2 x 10 seated EOM   TherAct (Goal Testing and revision):  Modified Oswestry Disability Index: 17/50 = 34%  [facility] PT Assessment - 08/31/23 0001       Standardized Balance Assessment   Standardized Balance Assessment Five Times Sit to Stand;10 meter walk test    Five times sit to stand comments  12.47   seconds without UE use   10 Meter Walk 3.74 ft/sec   without AD (modI) (8.77 seconds over 32.8 meters)            PATIENT EDATION:  Education details: Continue HEP + Progress towards goals Person educated:  Patient Education method: Programmer, multimedia, Demonstration, Verbal cues, and Handouts Education comprehension: verbalized understanding, returned demonstration, verbal cues required, and needs further education  HOME EXERCISE PROGRAM: Access Code: [email or web address] URL: [email or web address] Date: 08/31/2023 Prepared by: Maryruth Eve  Exercises - Sit to Stand Without Arm Support  - 1 x daily - 7 x weekly - 3 sets - 10 reps - Tandem Walking with Counter Support  - 1 x daily - 7 x weekly - 3-4 sets - Side Stepping with Resistance at Thighs and Counter Support  - 1 x daily - 7 x weekly - 3 sets - 10 reps - Forward Backward Monster Walk with Band at Thighs and Counter Support  - 1 x daily - 7 x weekly - 3 sets - 10 reps - Standing Hip Extension with Resistance at Ankles and Counter Support  - 1 x daily - 7 x weekly - 3 sets - 10 reps - Standing Hip Abduction with Resistance at Ankles and Counter Support  - 1 x daily - 7 x weekly - 3 sets - 10 reps - Lunge with Counter Support  - 1 x daily - 7 x weekly - 2-3 sets - 8 reps - Squat with Chair Touch  - 1 x daily - 7 x weekly - 2-3 sets - 10 reps - Prone Press Up  - 1 x daily - 7 x weekly - 3 sets - 10 reps - Seated Slump Neural Tensioner  - 1 x daily - 7 x weekly - 2 sets - 10 reps  BP log and [employer] 6 week beginner walking  program  ASSESSMENT:  CLINICAL IMPRESSION: Emphasis of skilled PT session on re-certing patient and assessing LTGs. Patient demonstrating excellent progress towards goals demonstrating progress towards decreased risk for falls on 5xSTS and outside of falls risk on gait speed. Patient did demonstrate minimal improvement on low back questioner but does demonstrate ongoing apprehension since surgery. Patient will benefit from 2x week for up to 8 more weeks of PT to finalize progress towards goals and maximize function.   OBJECTIVE IMPAIRMENTS: Abnormal gait, decreased activity tolerance,  decreased balance, decreased coordination, decreased endurance, decreased knowledge of condition, decreased knowledge of use of DME, decreased mobility, difficulty walking, decreased strength, impaired flexibility, impaired sensation, impaired UE functional use, improper body mechanics, and pain  ACTIVITY LIMITATIONS: carrying, lifting, bending, sitting, standing, squatting, stairs, transfers, bathing, locomotion level, and caring for others  PARTICIPATION LIMITATIONS: meal prep, cleaning, laundry, driving, shopping, community activity, and yard work  PERSONAL FACTORS: Age, Fitness, Past/current experiences, and 1 comorbidity: Lumbar laminectomy and C3-C7 fusion  are also affecting patient's functional outcome.    REHAB POTENTIAL: Good  CLINICAL DECISION MAKING: Stable/uncomplicated  EVALUATION COMPLEXITY: Low   GOALS: Goals reviewed with patient? Yes  SHORT TERM GOALS: Target date: 08/09/2023    Pt will be independent with initial HEP for improved strength, balance, transfers and gait.  Baseline: not established on eval  Goal status: MET  2.  Patient will improve FGA to greater than 19/30 to indicate a decreased risk of falls and improved dynamic stability.   Baseline: 17/30;25/30 (11/27) Goal status: MET  3. Patient will improve their 5x Sit to Stand score to less than 20 seconds to demonstrate a decreased risk for falls and improved LE strength.   Baseline: 22.91 seconds without UE support; 14.62s w/o UE support  Goal status: MET  4.  Pt will be compliant w/walking program for improved endurance and safety w/gait  Baseline:  Goal status: MET    LONG TERM GOALS: Target date: 09/06/2023    Pt will be independent with final HEP for improved strength, balance, transfers and gait.  Baseline: reports good understanding of exercises so far Goal status: PROGRESSING  2.  Patient will improve FGA to greater than 22/30 to indicate a decreased risk of falls and improved  dynamic stability.   Baseline: 17/30; 25/30  Goal status: MET   3.  Patient will improve modified ODI score to 28% impairment or less indicate a clinically important improvement in low back pain.   Baseline: 38% impairment; 34% impairment Goal status:NOT MET  4.  Patient will improve their 5x Sit to Stand score to less than 13 seconds to demonstrate a decreased risk for falls and improved LE strength.   Baseline: 22.91 seconds without UE support; 14.62s w/o UE support (11/27); improved to 12.47 seconds without UE use Goal status: MET  5.  Pt will improve gait velocity to at least 3.2 ft/s  for improved gait efficiency and safety  Baseline: 2.93 ft/s; improved to 3.74 ft/s Goal status: MET  LONG TERM GOALS FOLLOWING RECERT: Target date: 10/26/2023   Pt will be independent with final HEP for improved strength, balance, transfers and gait.  Baseline: reports good understanding of exercises so far Goal status: PROGRESSING  2.  Patient will improve modified ODI score to 28% impairment or less indicate a clinically important improvement in low back pain.   Baseline: 38% impairment; 34% impairment Goal status: IN PROGRESS   PLAN:  PT FREQUENCY: 2x/week  PT DURATION: 8 weeks  PLANNED INTERVENTIONS:  60109- PT Re-evaluation, 97110-Therapeutic exercises, 97530- Therapeutic activity, [id number]- Neuromuscular re-education, 97535- Self Care, [id number]- Manual therapy, [phone]- Gait training, [phone]- Aquatic Therapy, [phone]- Electrical stimulation (manual), Balance training, Stair training, Dry Needling, Joint mobilization, Spinal mobilization, and DME instructions.  PLAN FOR NEXT SESSION: work on dynamic stability challenges requiring modified SLS and directional turns, review waking program, redirect as needed to keep patient on task , hip strength, heel taps   Work on sciatic/radicular pain  Maryruth Eve, PT, DPT 08/31/2023, 12:51 PM

## 2023-09-03 ENCOUNTER — Ambulatory Visit: Payer: Medicare Other | Admitting: Physical Therapy

## 2023-09-03 DIAGNOSIS — M6281 Muscle weakness (generalized): Secondary | ICD-10-CM

## 2023-09-03 DIAGNOSIS — M5459 Other low back pain: Secondary | ICD-10-CM | POA: Diagnosis not present

## 2023-09-03 DIAGNOSIS — R29898 Other symptoms and signs involving the musculoskeletal system: Secondary | ICD-10-CM | POA: Diagnosis not present

## 2023-09-03 DIAGNOSIS — R2689 Other abnormalities of gait and mobility: Secondary | ICD-10-CM | POA: Diagnosis not present

## 2023-09-03 DIAGNOSIS — R2681 Unsteadiness on feet: Secondary | ICD-10-CM

## 2023-09-03 DIAGNOSIS — M542 Cervicalgia: Secondary | ICD-10-CM | POA: Diagnosis not present

## 2023-09-03 NOTE — Therapy (Signed)
OUTPATIENT PHYSICAL THERAPY CERVICAL AND THORACOLUMBAR TREATMENT   Patient Name: Matthew Tucker MRN: [id number] DOB:May 07, 1951, 72 y.o., male Today's Date: 09/03/2023   END OF SESSION:  PT End of Session - 09/03/23 1101     Visit Number 16    Number of Visits 29    Date for PT Re-Evaluation 10/26/23    Authorization Type Medicare    Progress Note Due on Visit 10    PT Start Time 1100    PT Stop Time 1145    PT Time Calculation (min) 45 min    Equipment Utilized During Treatment Gait belt    Activity Tolerance Patient tolerated treatment well    Behavior During Therapy WFL for tasks assessed/performed             Past Medical History:  Diagnosis Date   Arthritis    Neck, bilateral hands   BACK PAIN, CHRONIC 10/31/2010   Cervical stenosis of spine    Chronic lumbar radiculopathy 05/20/2015   Diastolic dysfunction 05/21/2018   Family history of colon cancer 16-Feb-2013   Father died at 70yo   HYPERLIPIDEMIA October 02, 2007   Impaired glucose tolerance 08/29/2011   PARESTHESIA 02-Oct-2007   Toxic effect of chlorine gas(987.6) 10/31/2010   Past Surgical History:  Procedure Laterality Date   ANTERIOR CERVICAL DECOMP/DISCECTOMY FUSION N/A 08/14/2022   Procedure: ACDF - C2-C3;  Surgeon: Tia Alert, MD;  Location: [facility] OR;  Service: Neurosurgery;  Laterality: N/A;   BIOPSY  10/13/2021   Procedure: BIOPSY;  Surgeon: Napoleon Form, MD;  Location: [facility];  Service: Endoscopy;;   BUNIONECTOMY     CARPAL TUNNEL RELEASE Right 08/14/2022   Procedure: Right carpal tunnel release;  Surgeon: Tia Alert, MD;  Location: [facility] OR;  Service: Neurosurgery;  Laterality: Right;   COLONOSCOPY     COLONOSCOPY WITH PROPOFOL N/A 10/13/2021   Procedure: COLONOSCOPY WITH PROPOFOL;  Surgeon: Napoleon Form, MD;  Location: [facility];  Service: Endoscopy;  Laterality: N/A;   FOOT SURGERY Right    LUMBAR LAMINECTOMY/DECOMPRESSION MICRODISCECTOMY N/A 06/20/2018   Procedure: Laminectomy and  Foraminotomy - Lumbar one-Lumbar two - Lumbar two-Lumbar three - Lumbar three-Lumbar four - Lumbar four-Lumbar five;  Surgeon: Tia Alert, MD;  Location: [facility] OR;  Service: Neurosurgery;  Laterality: N/A;   mass removal     back, forehead; benign (lipoma)    mass removal  2011   head; benign   POLYPECTOMY  10/13/2021   Procedure: POLYPECTOMY;  Surgeon: Napoleon Form, MD;  Location: [facility];  Service: Endoscopy;;   POSTERIOR CERVICAL FUSION/FORAMINOTOMY N/A 01/03/2018   Procedure: Posterior Cervical Fusion with lateral mass fixation - Cervical three - Cervical seven, cervical laminectomy Cervical three-cervical seven;  Surgeon: Tia Alert, MD;  Location: [facility] OR;  Service: Neurosurgery;  Laterality: N/A;   ROOT CANAL     s/p lipoma right scalp posteriorly  2011   ULNAR NERVE TRANSPOSITION Right 08/14/2022   Procedure: Right ulnar release;  Surgeon: Tia Alert, MD;  Location: [facility] OR;  Service: Neurosurgery;  Laterality: Right;   Patient Active Problem List   Diagnosis Date Noted   CHF (congestive heart failure) (HCC) 12/28/2022   S/P cervical spinal fusion 08/14/2022   Muscle atrophy 05/05/2022   COVID-19 virus infection 04/06/2022   Polyp of ascending colon    Polyp of cecum    Polyp of rectum    Degeneration of lumbar intervertebral disc 12/21/2020   Osteoarthritis 12/21/2020   Rheumatoid arthritis (HCC) 12/21/2020  Vitamin D deficiency 08/22/2019   Polyarthralgia 08/22/2019   Hypokalemia 08/22/2019   CKD (chronic kidney disease) stage 3, GFR 30-59 ml/min (HCC) 02/20/2019   Hand joint stiff, unspecified laterality 02/20/2019   S/P lumbar laminectomy 06/20/2018   Diastolic dysfunction 05/21/2018   Peripheral edema 05/17/2018   Dyspnea 05/17/2018   Cervical vertebral fusion 01/03/2018   Congenital spinal stenosis of lumbar region 08/22/2017   Bilateral hand pain 03/24/2017   Elevated blood pressure reading without diagnosis of hypertension 03/24/2017   Grief  reaction 01/02/2017   Nocturia 04/28/2016   Erectile dysfunction 04/28/2016   Chronic lumbar radiculopathy 05/20/2015   Increased prostate specific antigen (PSA) velocity 05/20/2015   Paresthesia of right leg 01/29/2015   Syncope 04/13/2014   EKG abnormality 04/13/2014   Family history of colon cancer 01/17/2013   Allergic rhinitis 09/10/2011   Microhematuria 09/08/2011   Impaired glucose tolerance 08/29/2011   Colon cancer screening 08/29/2011   Backache 10/31/2010   Toxic effect of chlorine gas 10/31/2010   WEAKNESS, RIGHT SIDE OF BODY 02/28/2010   HIP PAIN, RIGHT 12/02/2007   Pain in joint, lower leg 12/02/2007   Pain in Soft Tissues of Limb 12/02/2007   Hyperlipidemia 09/02/2007   Anxiety state 09/02/2007   PARESTHESIA 09/02/2007    PCP: Corwin Levins, MD  REFERRING PROVIDER: Corwin Levins, MD  REFERRING DIAG:  M54.9 (ICD-10-CM) - Bilateral back pain, unspecified back location, unspecified chronicity  M54.2 (ICD-10-CM) - Neck pain    Rationale for Evaluation and Treatment: Rehabilitation  THERAPY DIAG:  Muscle weakness (generalized)  Other low back pain  Other abnormalities of gait and mobility  Unsteadiness on feet  ONSET DATE: 06/21/23 (referral date)  SUBJECTIVE:                                                                                                                                                                                           SUBJECTIVE STATEMENT: Patient goes by "[name]."   Patient reports doing well today. Denies pain or acute changes.   PERTINENT HISTORY:  Anterior Cervical Decompression/discetomy and fusion of C2-C3 (12/23), R carpal tunnel release (12/23), R ulnar nerve release (12/23), lumbar laminectomy/decompression microdiscetomy L1-L5 (2019), posterior cervical fusion/foraminotomy C3-C7 (2019), osteoarthritis of neck and bilateral hands, chronic back pain, cervical stenosis of spine, chronic lumbar radiculopathy, hyperlipidemia,  CHF, CKD stage 3, hx of R foot surgery  PAIN:  Are you having pain?  no  - at rest Pt reports he cannot sit or walk for too long   PRECAUTIONS: Fall  RED FLAGS: None   WEIGHT BEARING RESTRICTIONS: No  FALLS:  Has patient fallen in  last 6 months? No, but plenty of near misses   LIVING ENVIRONMENT: Lives with: lives with their spouse Lives in: House/apartment Stairs: Yes: Internal: 17 steps; on right going up, on left going up, and can reach both and External: 5 steps; on right going up, on left going up, and can reach both Has following equipment at home: Single point cane and Walker - 2 wheeled  OCCUPATION: Retired   PLOF: Independent  PATIENT GOALS: "I just want to work on every thing"   OBJECTIVE:  Note: Objective measures were completed at Evaluation unless otherwise noted.  DIAGNOSTIC FINDINGS:  No new imaging since operations on neck and low back  VITALS   There were no vitals filed for this visit.  Seated on RUE  TODAY'S TREATMENT:         Ther Act  Discussed progression in PT so far and importance of pt continuing to move post-DC. Pt inquiring about why his RLE is weak, so reviewed MRI imaging of lumbar spine from 2016 and educated pt on stenosis noted from L3-L5 on right side. Pt verbalized understanding.   Ther Ex SciFit multi-peaks level 13 for 8 minutes using BUE/BLEs for neural priming for reciprocal movement, dynamic cardiovascular warmup and increased amplitude of stepping. RPE of 10/10 following activity and lengthy seated rest required.  Walking w/surge, x230' around gym, for improved functional strength and postural stability. Noted L shoulder shrug throughout, but pt reported no difficulty w/task. SBA for safety.  Surge deadlifts, x18 reps, for improved posterior chain strength and proper lifting technique. Used mirror for visual biofeedback on body position throughout and pt able to perform w/proper technique for all reps w/o cues from therapist. Pt  reported feeling the exercise in his legs w/no back discomfort reported.  Banded goodmornings w/orange theraband, x10 reps, for improved posterior chain strength and postural control. Min cues for slow eccentric decent and fast concentric, which pt able to perform well.   RPE of 8/10 following session, 0/10 pain     PATIENT EDATION:  Education details: Continue HEP + Progress towards goals Person educated: Patient Education method: Explanation, Demonstration, Verbal cues, and Handouts Education comprehension: verbalized understanding, returned demonstration, verbal cues required, and needs further education  HOME EXERCISE PROGRAM: Access Code: [email or web address] URL: [email or web address] Date: 08/31/2023 Prepared by: Maryruth Eve  Exercises - Sit to Stand Without Arm Support  - 1 x daily - 7 x weekly - 3 sets - 10 reps - Tandem Walking with Counter Support  - 1 x daily - 7 x weekly - 3-4 sets - Side Stepping with Resistance at Thighs and Counter Support  - 1 x daily - 7 x weekly - 3 sets - 10 reps - Forward Backward Monster Walk with Band at Thighs and Counter Support  - 1 x daily - 7 x weekly - 3 sets - 10 reps - Standing Hip Extension with Resistance at Ankles and Counter Support  - 1 x daily - 7 x weekly - 3 sets - 10 reps - Standing Hip Abduction with Resistance at Ankles and Counter Support  - 1 x daily - 7 x weekly - 3 sets - 10 reps - Lunge with Counter Support  - 1 x daily - 7 x weekly - 2-3 sets - 8 reps - Squat with Chair Touch  - 1 x daily - 7 x weekly - 2-3 sets - 10 reps - Prone Press Up  - 1 x daily - 7 x weekly - 3 sets - 10  reps - Seated Slump Neural Tensioner  - 1 x daily - 7 x weekly - 2 sets - 10 reps  BP log and [employer] 6 week beginner walking program  ASSESSMENT:  CLINICAL IMPRESSION: Emphasis of skilled PT session on proper lifting technique, posterior chain strength and postural control. Pt tolerated session well w/no  pain but continues to be most challenged by scifit intervals, requiring lengthy rest break following activity to recover. Pt demonstrated ability to perform deadlift w/proper technique for first time today, which is a huge improvement from eval. Pt continues to be limited by RLE weakness but overall has reduced his scissoring w/gait and has diminished fear-avoidance behavior w/spinal flexion/extension. Continue POC.   OBJECTIVE IMPAIRMENTS: Abnormal gait, decreased activity tolerance, decreased balance, decreased coordination, decreased endurance, decreased knowledge of condition, decreased knowledge of use of DME, decreased mobility, difficulty walking, decreased strength, impaired flexibility, impaired sensation, impaired UE functional use, improper body mechanics, and pain  ACTIVITY LIMITATIONS: carrying, lifting, bending, sitting, standing, squatting, stairs, transfers, bathing, locomotion level, and caring for others  PARTICIPATION LIMITATIONS: meal prep, cleaning, laundry, driving, shopping, community activity, and yard work  PERSONAL FACTORS: Age, Fitness, Past/current experiences, and 1 comorbidity: Lumbar laminectomy and C3-C7 fusion  are also affecting patient's functional outcome.    REHAB POTENTIAL: Good  CLINICAL DECISION MAKING: Stable/uncomplicated  EVALUATION COMPLEXITY: Low   GOALS: Goals reviewed with patient? Yes  SHORT TERM GOALS: Target date: 08/09/2023    Pt will be independent with initial HEP for improved strength, balance, transfers and gait.  Baseline: not established on eval  Goal status: MET  2.  Patient will improve FGA to greater than 19/30 to indicate a decreased risk of falls and improved dynamic stability.   Baseline: 17/30;25/30 (11/27) Goal status: MET  3. Patient will improve their 5x Sit to Stand score to less than 20 seconds to demonstrate a decreased risk for falls and improved LE strength.   Baseline: 22.91 seconds without UE support; 14.62s  w/o UE support  Goal status: MET  4.  Pt will be compliant w/walking program for improved endurance and safety w/gait  Baseline:  Goal status: MET    LONG TERM GOALS: Target date: 09/06/2023    Pt will be independent with final HEP for improved strength, balance, transfers and gait.  Baseline: reports good understanding of exercises so far Goal status: PROGRESSING  2.  Patient will improve FGA to greater than 22/30 to indicate a decreased risk of falls and improved dynamic stability.   Baseline: 17/30; 25/30  Goal status: MET   3.  Patient will improve modified ODI score to 28% impairment or less indicate a clinically important improvement in low back pain.   Baseline: 38% impairment; 34% impairment Goal status:NOT MET  4.  Patient will improve their 5x Sit to Stand score to less than 13 seconds to demonstrate a decreased risk for falls and improved LE strength.   Baseline: 22.91 seconds without UE support; 14.62s w/o UE support (11/27); improved to 12.47 seconds without UE use Goal status: MET  5.  Pt will improve gait velocity to at least 3.2 ft/s  for improved gait efficiency and safety  Baseline: 2.93 ft/s; improved to 3.74 ft/s Goal status: MET  LONG TERM GOALS FOLLOWING RECERT: Target date: 10/26/2023   Pt will be independent with final HEP for improved strength, balance, transfers and gait.  Baseline: reports good understanding of exercises so far Goal status: PROGRESSING  2.  Patient will improve  modified ODI score to 28% impairment or less indicate a clinically important improvement in low back pain.   Baseline: 38% impairment; 34% impairment Goal status: IN PROGRESS   PLAN:  PT FREQUENCY: 2x/week  PT DURATION: 8 weeks  PLANNED INTERVENTIONS: 97164- PT Re-evaluation, 97110-Therapeutic exercises, 97530- Therapeutic activity, 97112- Neuromuscular re-education, 97535- Self Care, 27253- Manual therapy, [phone]- Gait training, [phone]- Aquatic Therapy, [phone]-  Electrical stimulation (manual), Balance training, Stair training, Dry Needling, Joint mobilization, Spinal mobilization, and DME instructions.  PLAN FOR NEXT SESSION: work on dynamic stability challenges requiring modified SLS and directional turns, review waking program, redirect as needed to keep patient on task , hip strength, heel taps, toe sweeps   Work on sciatic/radicular pain  Arnell Slivinski E Milayah Krell, PT, DPT 09/03/2023, 11:49 AM

## 2023-09-10 ENCOUNTER — Ambulatory Visit: Payer: Medicare Other | Admitting: Physical Therapy

## 2023-09-13 ENCOUNTER — Telehealth: Payer: Self-pay | Admitting: Physical Therapy

## 2023-09-13 ENCOUNTER — Ambulatory Visit: Payer: Medicare Other | Attending: Internal Medicine | Admitting: Physical Therapy

## 2023-09-13 DIAGNOSIS — M6281 Muscle weakness (generalized): Secondary | ICD-10-CM | POA: Insufficient documentation

## 2023-09-13 DIAGNOSIS — R2681 Unsteadiness on feet: Secondary | ICD-10-CM | POA: Insufficient documentation

## 2023-09-13 DIAGNOSIS — R2689 Other abnormalities of gait and mobility: Secondary | ICD-10-CM | POA: Insufficient documentation

## 2023-09-13 NOTE — Telephone Encounter (Signed)
 Called patient and LVM as patient no showed to PT visit. Reminded of no show policy and upcoming appointment.   Maryruth Eve, PT, DPT

## 2023-09-18 ENCOUNTER — Ambulatory Visit: Payer: Medicare Other | Admitting: Physical Therapy

## 2023-09-18 DIAGNOSIS — M6281 Muscle weakness (generalized): Secondary | ICD-10-CM

## 2023-09-18 DIAGNOSIS — R2689 Other abnormalities of gait and mobility: Secondary | ICD-10-CM | POA: Diagnosis not present

## 2023-09-18 DIAGNOSIS — R2681 Unsteadiness on feet: Secondary | ICD-10-CM | POA: Diagnosis not present

## 2023-09-18 NOTE — Therapy (Signed)
 OUTPATIENT PHYSICAL THERAPY CERVICAL AND THORACOLUMBAR TREATMENT   Patient Name: Matthew Tucker MRN: [id number] DOB:11-04-1950, 73 y.o., male Today's Date: 09/18/2023   END OF SESSION:  PT End of Session - 09/18/23 0936     Visit Number 17    Number of Visits 29    Date for PT Re-Evaluation 10/26/23    Authorization Type Medicare    Progress Note Due on Visit 10    PT Start Time 0933    PT Stop Time 1014    PT Time Calculation (min) 41 min    Equipment Utilized During Treatment --    Activity Tolerance Patient tolerated treatment well    Behavior During Therapy [facility] for tasks assessed/performed              Past Medical History:  Diagnosis Date   Arthritis    Neck, bilateral hands   BACK PAIN, CHRONIC 10/31/2010   Cervical stenosis of spine    Chronic lumbar radiculopathy 05/20/2015   Diastolic dysfunction 05/21/2018   Family history of colon cancer 02-07-2013   Father died at 70yo   HYPERLIPIDEMIA 09-23-2007   Impaired glucose tolerance 08/29/2011   PARESTHESIA 09/23/2007   Toxic effect of chlorine gas(987.6) 10/31/2010   Past Surgical History:  Procedure Laterality Date   ANTERIOR CERVICAL DECOMP/DISCECTOMY FUSION N/A 08/14/2022   Procedure: ACDF - C2-C3;  Surgeon: Joshua Alm RAMAN, MD;  Location: [facility] OR;  Service: Neurosurgery;  Laterality: N/A;   BIOPSY  10/13/2021   Procedure: BIOPSY;  Surgeon: Shila Gustav GAILS, MD;  Location: [facility];  Service: Endoscopy;;   BUNIONECTOMY     CARPAL TUNNEL RELEASE Right 08/14/2022   Procedure: Right carpal tunnel release;  Surgeon: Joshua Alm RAMAN, MD;  Location: [facility] OR;  Service: Neurosurgery;  Laterality: Right;   COLONOSCOPY     COLONOSCOPY WITH PROPOFOL  N/A 10/13/2021   Procedure: COLONOSCOPY WITH PROPOFOL ;  Surgeon: Shila Gustav GAILS, MD;  Location: [facility];  Service: Endoscopy;  Laterality: N/A;   FOOT SURGERY Right    LUMBAR LAMINECTOMY/DECOMPRESSION MICRODISCECTOMY N/A 06/20/2018   Procedure: Laminectomy and  Foraminotomy - Lumbar one-Lumbar two - Lumbar two-Lumbar three - Lumbar three-Lumbar four - Lumbar four-Lumbar five;  Surgeon: Joshua Alm RAMAN, MD;  Location: [facility] OR;  Service: Neurosurgery;  Laterality: N/A;   mass removal     back, forehead; benign (lipoma)    mass removal  2011   head; benign   POLYPECTOMY  10/13/2021   Procedure: POLYPECTOMY;  Surgeon: Shila Gustav GAILS, MD;  Location: [facility];  Service: Endoscopy;;   POSTERIOR CERVICAL FUSION/FORAMINOTOMY N/A 01/03/2018   Procedure: Posterior Cervical Fusion with lateral mass fixation - Cervical three - Cervical seven, cervical laminectomy Cervical three-cervical seven;  Surgeon: Joshua Alm RAMAN, MD;  Location: [facility] OR;  Service: Neurosurgery;  Laterality: N/A;   ROOT CANAL     s/p lipoma right scalp posteriorly  2011   ULNAR NERVE TRANSPOSITION Right 08/14/2022   Procedure: Right ulnar release;  Surgeon: Joshua Alm RAMAN, MD;  Location: [facility] OR;  Service: Neurosurgery;  Laterality: Right;   Patient Active Problem List   Diagnosis Date Noted   CHF (congestive heart failure) (HCC) 12/28/2022   S/P cervical spinal fusion 08/14/2022   Muscle atrophy 05/05/2022   COVID-19 virus infection 04/06/2022   Polyp of ascending colon    Polyp of cecum    Polyp of rectum    Degeneration of lumbar intervertebral disc 12/21/2020   Osteoarthritis 12/21/2020   Rheumatoid arthritis (HCC) 12/21/2020  Vitamin D  deficiency 08/22/2019   Polyarthralgia 08/22/2019   Hypokalemia 08/22/2019   CKD (chronic kidney disease) stage 3, GFR 30-59 ml/min (HCC) 02/20/2019   Hand joint stiff, unspecified laterality 02/20/2019   S/P lumbar laminectomy 06/20/2018   Diastolic dysfunction 05/21/2018   Peripheral edema 05/17/2018   Dyspnea 05/17/2018   Cervical vertebral fusion 01/03/2018   Congenital spinal stenosis of lumbar region 08/22/2017   Bilateral hand pain 03/24/2017   Elevated blood pressure reading without diagnosis of hypertension 03/24/2017   Grief  reaction 01/02/2017   Nocturia 04/28/2016   Erectile dysfunction 04/28/2016   Chronic lumbar radiculopathy 05/20/2015   Increased prostate specific antigen (PSA) velocity 05/20/2015   Paresthesia of right leg 01/29/2015   Syncope 04/13/2014   EKG abnormality 04/13/2014   Family history of colon cancer 01/17/2013   Allergic rhinitis 09/10/2011   Microhematuria 09/08/2011   Impaired glucose tolerance 08/29/2011   Colon cancer screening 08/29/2011   Backache 10/31/2010   Toxic effect of chlorine gas 10/31/2010   WEAKNESS, RIGHT SIDE OF BODY 02/28/2010   HIP PAIN, RIGHT 12/02/2007   Pain in joint, lower leg 12/02/2007   Pain in Soft Tissues of Limb 12/02/2007   Hyperlipidemia 09/02/2007   Anxiety state 09/02/2007   PARESTHESIA 09/02/2007    PCP: Norleen Lynwood ORN, MD  REFERRING PROVIDER: Norleen Lynwood ORN, MD  REFERRING DIAG:  M54.9 (ICD-10-CM) - Bilateral back pain, unspecified back location, unspecified chronicity  M54.2 (ICD-10-CM) - Neck pain    Rationale for Evaluation and Treatment: Rehabilitation  THERAPY DIAG:  Muscle weakness (generalized)  Other abnormalities of gait and mobility  Unsteadiness on feet  ONSET DATE: 06/21/23 (referral date)  SUBJECTIVE:                                                                                                                                                                                           SUBJECTIVE STATEMENT: Patient goes by Matthew Tucker.   Patient reports doing well today. Had a cold last week and did not want to get anyone sick so he did not come. HEP is going well. Denies pain or acute changes today. Wants to work on stretches today.   PERTINENT HISTORY:  Anterior Cervical Decompression/discetomy and fusion of C2-C3 (12/23), R carpal tunnel release (12/23), R ulnar nerve release (12/23), lumbar laminectomy/decompression microdiscetomy L1-L5 (2019), posterior cervical fusion/foraminotomy C3-C7 (2019), osteoarthritis of neck  and bilateral hands, chronic back pain, cervical stenosis of spine, chronic lumbar radiculopathy, hyperlipidemia, CHF, CKD stage 3, hx of R foot surgery  PAIN:  Are you having pain?  no  - at rest Pt reports he cannot sit or walk  for too long   PRECAUTIONS: Fall  RED FLAGS: None   WEIGHT BEARING RESTRICTIONS: No  FALLS:  Has patient fallen in last 6 months? No, but plenty of near misses   LIVING ENVIRONMENT: Lives with: lives with their spouse Lives in: House/apartment Stairs: Yes: Internal: 17 steps; on right going up, on left going up, and can reach both and External: 5 steps; on right going up, on left going up, and can reach both Has following equipment at home: Single point cane and Walker - 2 wheeled  OCCUPATION: Retired   PLOF: Independent  PATIENT GOALS: I just want to work on every thing   OBJECTIVE:  Note: Objective measures were completed at Evaluation unless otherwise noted.  DIAGNOSTIC FINDINGS:  No new imaging since operations on neck and low back  VITALS   There were no vitals filed for this visit.  Seated on RUE  TODAY'S TREATMENT:          Ther Ex On mat table for improved spinal mobility per pt request:  Cat cows, x15 reps. Max visual cues required to perform properly, as pt attempting to go into child's pose rather than cat.  Child's pose w/lateral reaches, x3 minutes.  Prone snow angels, x8 reps. Pt unable to obtain full ROM and performed prone press ups intermittently w/movement due to paraspinal tightness.  Modified prone scorpions, x8 per side. Noted increased mobility w/each rep SciFit multi-peaks level 9 for 8 minutes using BUE/BLEs for neural priming for reciprocal movement, dynamic cardiovascular warmup and increased amplitude of stepping. Immediately followed w/2 minutes of iso strength w/60 SPM target. RPE of 10/10 following activity  Seated thoracic spine extension using foam roller, 3x15s holds. Pt reported good relief for this    RPE of 8/10 following session, 0/10 pain     PATIENT EDATION:  Education details: Continue HEP  Person educated: Patient Education method: Explanation, Demonstration, Verbal cues, and Handouts Education comprehension: verbalized understanding, returned demonstration, verbal cues required, and needs further education  HOME EXERCISE PROGRAM: Access Code: [email or web address] URL: [email or web address] Date: 08/31/2023 Prepared by: Lauraine Grumbling  Exercises - Sit to Stand Without Arm Support  - 1 x daily - 7 x weekly - 3 sets - 10 reps - Tandem Walking with Counter Support  - 1 x daily - 7 x weekly - 3-4 sets - Side Stepping with Resistance at Thighs and Counter Support  - 1 x daily - 7 x weekly - 3 sets - 10 reps - Forward Backward Monster Walk with Band at Thighs and Counter Support  - 1 x daily - 7 x weekly - 3 sets - 10 reps - Standing Hip Extension with Resistance at Ankles and Counter Support  - 1 x daily - 7 x weekly - 3 sets - 10 reps - Standing Hip Abduction with Resistance at Ankles and Counter Support  - 1 x daily - 7 x weekly - 3 sets - 10 reps - Lunge with Counter Support  - 1 x daily - 7 x weekly - 2-3 sets - 8 reps - Squat with Chair Touch  - 1 x daily - 7 x weekly - 2-3 sets - 10 reps - Prone Press Up  - 1 x daily - 7 x weekly - 3 sets - 10 reps - Seated Slump Neural Tensioner  - 1 x daily - 7 x weekly - 2 sets - 10 reps  BP log and [employer] 6 week beginner walking program  ASSESSMENT:  CLINICAL IMPRESSION: Emphasis of skilled PT session on spinal mobility and endurance per pt request. Pt reports he has not been stretching as much as he should at home and wanted to spend session working on mobility. Pt demonstrates improved spinal extension ROM and tolerance but continues to demonstrate flexion preference. Continue POC.   OBJECTIVE IMPAIRMENTS: Abnormal gait, decreased activity tolerance, decreased balance, decreased coordination,  decreased endurance, decreased knowledge of condition, decreased knowledge of use of DME, decreased mobility, difficulty walking, decreased strength, impaired flexibility, impaired sensation, impaired UE functional use, improper body mechanics, and pain  ACTIVITY LIMITATIONS: carrying, lifting, bending, sitting, standing, squatting, stairs, transfers, bathing, locomotion level, and caring for others  PARTICIPATION LIMITATIONS: meal prep, cleaning, laundry, driving, shopping, community activity, and yard work  PERSONAL FACTORS: Age, Fitness, Past/current experiences, and 1 comorbidity: Lumbar laminectomy and C3-C7 fusion  are also affecting patient's functional outcome.    REHAB POTENTIAL: Good  CLINICAL DECISION MAKING: Stable/uncomplicated  EVALUATION COMPLEXITY: Low   GOALS: Goals reviewed with patient? Yes  SHORT TERM GOALS: Target date: 08/09/2023    Pt will be independent with initial HEP for improved strength, balance, transfers and gait.  Baseline: not established on eval  Goal status: MET  2.  Patient will improve FGA to greater than 19/30 to indicate a decreased risk of falls and improved dynamic stability.   Baseline: 17/30;25/30 (11/27) Goal status: MET  3. Patient will improve their 5x Sit to Stand score to less than 20 seconds to demonstrate a decreased risk for falls and improved LE strength.   Baseline: 22.91 seconds without UE support; 14.62s w/o UE support  Goal status: MET  4.  Pt will be compliant w/walking program for improved endurance and safety w/gait  Baseline:  Goal status: MET    LONG TERM GOALS: Target date: 09/06/2023    Pt will be independent with final HEP for improved strength, balance, transfers and gait.  Baseline: reports good understanding of exercises so far Goal status: PROGRESSING  2.  Patient will improve FGA to greater than 22/30 to indicate a decreased risk of falls and improved dynamic stability.   Baseline: 17/30; 25/30   Goal status: MET   3.  Patient will improve modified ODI score to 28% impairment or less indicate a clinically important improvement in low back pain.   Baseline: 38% impairment; 34% impairment Goal status:NOT MET  4.  Patient will improve their 5x Sit to Stand score to less than 13 seconds to demonstrate a decreased risk for falls and improved LE strength.   Baseline: 22.91 seconds without UE support; 14.62s w/o UE support (11/27); improved to 12.47 seconds without UE use Goal status: MET  5.  Pt will improve gait velocity to at least 3.2 ft/s  for improved gait efficiency and safety  Baseline: 2.93 ft/s; improved to 3.74 ft/s Goal status: MET  LONG TERM GOALS FOLLOWING RECERT: Target date: 10/26/2023   Pt will be independent with final HEP for improved strength, balance, transfers and gait.  Baseline: reports good understanding of exercises so far Goal status: PROGRESSING  2.  Patient will improve modified ODI score to 28% impairment or less indicate a clinically important improvement in low back pain.   Baseline: 38% impairment; 34% impairment Goal status: IN PROGRESS   PLAN:  PT FREQUENCY: 2x/week  PT DURATION: 8 weeks  PLANNED INTERVENTIONS: [id number]- PT Re-evaluation, 97110-Therapeutic exercises, 97530- Therapeutic activity, [id number]- Neuromuscular re-education, 97535- Self Care, [id number]- Manual therapy, [id number]- Gait training, [phone]- Aquatic Therapy, [phone]- Electrical  stimulation (manual), Balance training, Stair training, Dry Needling, Joint mobilization, Spinal mobilization, and DME instructions.  PLAN FOR NEXT SESSION: work on dynamic stability challenges requiring modified SLS and directional turns, review waking program, redirect as needed to keep patient on task , hip strength, heel taps, toe sweeps   Work on sciatic/radicular pain  Login Muckleroy E Kerianne Gurr, PT, DPT 09/18/2023, 10:14 AM

## 2023-09-20 ENCOUNTER — Ambulatory Visit: Payer: Medicare Other | Admitting: Physical Therapy

## 2023-09-20 DIAGNOSIS — R2689 Other abnormalities of gait and mobility: Secondary | ICD-10-CM

## 2023-09-20 DIAGNOSIS — M6281 Muscle weakness (generalized): Secondary | ICD-10-CM

## 2023-09-20 DIAGNOSIS — R2681 Unsteadiness on feet: Secondary | ICD-10-CM | POA: Diagnosis not present

## 2023-09-20 NOTE — Therapy (Signed)
 OUTPATIENT PHYSICAL THERAPY CERVICAL AND THORACOLUMBAR TREATMENT   Patient Name: Matthew Tucker MRN: [id number] DOB:1951-05-20, 73 y.o., male Today's Date: 09/20/2023   END OF SESSION:  PT End of Session - 09/20/23 1025     Visit Number 18    Number of Visits 29    Date for PT Re-Evaluation 10/26/23    Authorization Type Medicare    Progress Note Due on Visit 10    PT Start Time 1021    PT Stop Time 1105    PT Time Calculation (min) 44 min    Activity Tolerance Patient limited by pain    Behavior During Therapy [facility] for tasks assessed/performed               Past Medical History:  Diagnosis Date   Arthritis    Neck, bilateral hands   BACK PAIN, CHRONIC 10/31/2010   Cervical stenosis of spine    Chronic lumbar radiculopathy 05/20/2015   Diastolic dysfunction 05/21/2018   Family history of colon cancer Jan 23, 2013   Father died at 70yo   HYPERLIPIDEMIA 09/08/2007   Impaired glucose tolerance 08/29/2011   PARESTHESIA 09/08/2007   Toxic effect of chlorine gas(987.6) 10/31/2010   Past Surgical History:  Procedure Laterality Date   ANTERIOR CERVICAL DECOMP/DISCECTOMY FUSION N/A 08/14/2022   Procedure: ACDF - C2-C3;  Surgeon: Joshua Alm RAMAN, MD;  Location: [facility] OR;  Service: Neurosurgery;  Laterality: N/A;   BIOPSY  10/13/2021   Procedure: BIOPSY;  Surgeon: Shila Gustav GAILS, MD;  Location: [facility];  Service: Endoscopy;;   BUNIONECTOMY     CARPAL TUNNEL RELEASE Right 08/14/2022   Procedure: Right carpal tunnel release;  Surgeon: Joshua Alm RAMAN, MD;  Location: [facility] OR;  Service: Neurosurgery;  Laterality: Right;   COLONOSCOPY     COLONOSCOPY WITH PROPOFOL  N/A 10/13/2021   Procedure: COLONOSCOPY WITH PROPOFOL ;  Surgeon: Shila Gustav GAILS, MD;  Location: [facility];  Service: Endoscopy;  Laterality: N/A;   FOOT SURGERY Right    LUMBAR LAMINECTOMY/DECOMPRESSION MICRODISCECTOMY N/A 06/20/2018   Procedure: Laminectomy and Foraminotomy - Lumbar one-Lumbar two - Lumbar two-Lumbar  three - Lumbar three-Lumbar four - Lumbar four-Lumbar five;  Surgeon: Joshua Alm RAMAN, MD;  Location: [facility] OR;  Service: Neurosurgery;  Laterality: N/A;   mass removal     back, forehead; benign (lipoma)    mass removal  2011   head; benign   POLYPECTOMY  10/13/2021   Procedure: POLYPECTOMY;  Surgeon: Shila Gustav GAILS, MD;  Location: [facility];  Service: Endoscopy;;   POSTERIOR CERVICAL FUSION/FORAMINOTOMY N/A 01/03/2018   Procedure: Posterior Cervical Fusion with lateral mass fixation - Cervical three - Cervical seven, cervical laminectomy Cervical three-cervical seven;  Surgeon: Joshua Alm RAMAN, MD;  Location: [facility] OR;  Service: Neurosurgery;  Laterality: N/A;   ROOT CANAL     s/p lipoma right scalp posteriorly  2011   ULNAR NERVE TRANSPOSITION Right 08/14/2022   Procedure: Right ulnar release;  Surgeon: Joshua Alm RAMAN, MD;  Location: [facility] OR;  Service: Neurosurgery;  Laterality: Right;   Patient Active Problem List   Diagnosis Date Noted   CHF (congestive heart failure) (HCC) 12/28/2022   S/P cervical spinal fusion 08/14/2022   Muscle atrophy 05/05/2022   COVID-19 virus infection 04/06/2022   Polyp of ascending colon    Polyp of cecum    Polyp of rectum    Degeneration of lumbar intervertebral disc 12/21/2020   Osteoarthritis 12/21/2020   Rheumatoid arthritis (HCC) 12/21/2020   Vitamin D  deficiency 08/22/2019  Polyarthralgia 08/22/2019   Hypokalemia 08/22/2019   CKD (chronic kidney disease) stage 3, GFR 30-59 ml/min (HCC) 02/20/2019   Hand joint stiff, unspecified laterality 02/20/2019   S/P lumbar laminectomy 06/20/2018   Diastolic dysfunction 05/21/2018   Peripheral edema 05/17/2018   Dyspnea 05/17/2018   Cervical vertebral fusion 01/03/2018   Congenital spinal stenosis of lumbar region 08/22/2017   Bilateral hand pain 03/24/2017   Elevated blood pressure reading without diagnosis of hypertension 03/24/2017   Grief reaction 01/02/2017   Nocturia 04/28/2016   Erectile  dysfunction 04/28/2016   Chronic lumbar radiculopathy 05/20/2015   Increased prostate specific antigen (PSA) velocity 05/20/2015   Paresthesia of right leg 01/29/2015   Syncope 04/13/2014   EKG abnormality 04/13/2014   Family history of colon cancer 01/17/2013   Allergic rhinitis 09/10/2011   Microhematuria 09/08/2011   Impaired glucose tolerance 08/29/2011   Colon cancer screening 08/29/2011   Backache 10/31/2010   Toxic effect of chlorine gas 10/31/2010   WEAKNESS, RIGHT SIDE OF BODY 02/28/2010   HIP PAIN, RIGHT 12/02/2007   Pain in joint, lower leg 12/02/2007   Pain in Soft Tissues of Limb 12/02/2007   Hyperlipidemia 09/02/2007   Anxiety state 09/02/2007   PARESTHESIA 09/02/2007    PCP: Norleen Lynwood ORN, MD  REFERRING PROVIDER: Norleen Lynwood ORN, MD  REFERRING DIAG:  M54.9 (ICD-10-CM) - Bilateral back pain, unspecified back location, unspecified chronicity  M54.2 (ICD-10-CM) - Neck pain    Rationale for Evaluation and Treatment: Rehabilitation  THERAPY DIAG:  Muscle weakness (generalized)  Other abnormalities of gait and mobility  Unsteadiness on feet  ONSET DATE: 06/21/23 (referral date)  SUBJECTIVE:                                                                                                                                                                                           SUBJECTIVE STATEMENT: Patient goes by Matthew Tucker.   Patient ambulated into clinic w/a limp, states his R knee started hurting badly to the point where he can barely walk. Is wearing a knee brace and has Salonpas on his knee. Thinks it is arthritis. Pain started two days ago.   PERTINENT HISTORY:  Anterior Cervical Decompression/discetomy and fusion of C2-C3 (12/23), R carpal tunnel release (12/23), R ulnar nerve release (12/23), lumbar laminectomy/decompression microdiscetomy L1-L5 (2019), posterior cervical fusion/foraminotomy C3-C7 (2019), osteoarthritis of neck and bilateral hands, chronic  back pain, cervical stenosis of spine, chronic lumbar radiculopathy, hyperlipidemia, CHF, CKD stage 3, hx of R foot surgery  PAIN:  Are you having pain? Yes: NPRS scale: 9 Pain location: R knee Pain description: Achy     PRECAUTIONS: Fall  RED FLAGS: None   WEIGHT BEARING RESTRICTIONS: No  FALLS:  Has patient fallen in last 6 months? No, but plenty of near misses   LIVING ENVIRONMENT: Lives with: lives with their spouse Lives in: House/apartment Stairs: Yes: Internal: 17 steps; on right going up, on left going up, and can reach both and External: 5 steps; on right going up, on left going up, and can reach both Has following equipment at home: Single point cane and Walker - 2 wheeled  OCCUPATION: Retired   PLOF: Independent  PATIENT GOALS: I just want to work on every thing   OBJECTIVE:  Note: Objective measures were completed at Evaluation unless otherwise noted.  DIAGNOSTIC FINDINGS:  No new imaging since operations on neck and low back  VITALS   There were no vitals filed for this visit.  Seated on RUE  TODAY'S TREATMENT:         Ther Act Assessed R knee and no swelling or abnormalities noted. No TTP along joint line. Tightness noted along IT insertion, but no pain reported at insertion point.   Ther Ex LAQ w/resistance via red theraband w/3s isometric hold and 2s eccentric, x10 reps per side. Pt denied pain with activity.  Seated march overs using 15# KB, x10 per side, for improved core and functional hip stability. No pain reported w/movement  Seated thoracic extension over foam roller, x10 minutes, for improved spinal mobility and pain modulation.      PATIENT EDATION:  Education details: Continue HEP  Person educated: Patient Education method: Solicitor, Verbal cues, and Handouts Education comprehension: verbalized understanding, returned demonstration, verbal cues required, and needs further education  HOME EXERCISE PROGRAM: Access  Code: [email or web address] URL: [email or web address] Date: 08/31/2023 Prepared by: Lauraine Grumbling  Exercises - Sit to Stand Without Arm Support  - 1 x daily - 7 x weekly - 3 sets - 10 reps - Tandem Walking with Counter Support  - 1 x daily - 7 x weekly - 3-4 sets - Side Stepping with Resistance at Thighs and Counter Support  - 1 x daily - 7 x weekly - 3 sets - 10 reps - Forward Backward Monster Walk with Band at [employer] and Counter Support  - 1 x daily - 7 x weekly - 3 sets - 10 reps - Standing Hip Extension with Resistance at Ankles and Counter Support  - 1 x daily - 7 x weekly - 3 sets - 10 reps - Standing Hip Abduction with Resistance at Ankles and Counter Support  - 1 x daily - 7 x weekly - 3 sets - 10 reps - Lunge with Counter Support  - 1 x daily - 7 x weekly - 2-3 sets - 8 reps - Squat with Chair Touch  - 1 x daily - 7 x weekly - 2-3 sets - 10 reps - Prone Press Up  - 1 x daily - 7 x weekly - 3 sets - 10 reps - Seated Slump Neural Tensioner  - 1 x daily - 7 x weekly - 2 sets - 10 reps  BP log and [employer] 6 week beginner walking program  ASSESSMENT:  CLINICAL IMPRESSION: Session limited as pt having severe pain in R knee and wanted to take it easy. No TTP or edema noted along joint line, pt reports he thinks pain is arthritic in nature. Pt requesting to do light exercise today, which he tolerated well. Pt escorted out of clinic due to antalgic gait pattern and reports of  knee buckling, but no buckling noted. Continue POC.   OBJECTIVE IMPAIRMENTS: Abnormal gait, decreased activity tolerance, decreased balance, decreased coordination, decreased endurance, decreased knowledge of condition, decreased knowledge of use of DME, decreased mobility, difficulty walking, decreased strength, impaired flexibility, impaired sensation, impaired UE functional use, improper body mechanics, and pain  ACTIVITY LIMITATIONS: carrying, lifting, bending, sitting,  standing, squatting, stairs, transfers, bathing, locomotion level, and caring for others  PARTICIPATION LIMITATIONS: meal prep, cleaning, laundry, driving, shopping, community activity, and yard work  PERSONAL FACTORS: Age, Fitness, Past/current experiences, and 1 comorbidity: Lumbar laminectomy and C3-C7 fusion  are also affecting patient's functional outcome.    REHAB POTENTIAL: Good  CLINICAL DECISION MAKING: Stable/uncomplicated  EVALUATION COMPLEXITY: Low   GOALS: Goals reviewed with patient? Yes  SHORT TERM GOALS: Target date: 08/09/2023    Pt will be independent with initial HEP for improved strength, balance, transfers and gait.  Baseline: not established on eval  Goal status: MET  2.  Patient will improve FGA to greater than 19/30 to indicate a decreased risk of falls and improved dynamic stability.   Baseline: 17/30;25/30 (11/27) Goal status: MET  3. Patient will improve their 5x Sit to Stand score to less than 20 seconds to demonstrate a decreased risk for falls and improved LE strength.   Baseline: 22.91 seconds without UE support; 14.62s w/o UE support  Goal status: MET  4.  Pt will be compliant w/walking program for improved endurance and safety w/gait  Baseline:  Goal status: MET    LONG TERM GOALS: Target date: 09/06/2023    Pt will be independent with final HEP for improved strength, balance, transfers and gait.  Baseline: reports good understanding of exercises so far Goal status: PROGRESSING  2.  Patient will improve FGA to greater than 22/30 to indicate a decreased risk of falls and improved dynamic stability.   Baseline: 17/30; 25/30  Goal status: MET   3.  Patient will improve modified ODI score to 28% impairment or less indicate a clinically important improvement in low back pain.   Baseline: 38% impairment; 34% impairment Goal status:NOT MET  4.  Patient will improve their 5x Sit to Stand score to less than 13 seconds to demonstrate a  decreased risk for falls and improved LE strength.   Baseline: 22.91 seconds without UE support; 14.62s w/o UE support (11/27); improved to 12.47 seconds without UE use Goal status: MET  5.  Pt will improve gait velocity to at least 3.2 ft/s  for improved gait efficiency and safety  Baseline: 2.93 ft/s; improved to 3.74 ft/s Goal status: MET  LONG TERM GOALS FOLLOWING RECERT: Target date: 10/26/2023   Pt will be independent with final HEP for improved strength, balance, transfers and gait.  Baseline: reports good understanding of exercises so far Goal status: PROGRESSING  2.  Patient will improve modified ODI score to 28% impairment or less indicate a clinically important improvement in low back pain.   Baseline: 38% impairment; 34% impairment Goal status: IN PROGRESS   PLAN:  PT FREQUENCY: 2x/week  PT DURATION: 8 weeks  PLANNED INTERVENTIONS: 97164- PT Re-evaluation, 97110-Therapeutic exercises, 97530- Therapeutic activity, 97112- Neuromuscular re-education, 97535- Self Care, [id number]- Manual therapy, [phone]- Gait training, [phone]- Aquatic Therapy, [phone]- Electrical stimulation (manual), Balance training, Stair training, Dry Needling, Joint mobilization, Spinal mobilization, and DME instructions.  PLAN FOR NEXT SESSION: work on dynamic stability challenges requiring modified SLS and directional turns, review waking program, redirect as needed to keep patient on task , hip strength,  heel taps, toe sweeps   Work on sciatic/radicular pain  Matthew Tucker E Liviah Cake, PT, DPT 09/20/2023, 11:13 AM

## 2023-09-25 ENCOUNTER — Ambulatory Visit: Payer: Medicare Other | Admitting: Physical Therapy

## 2023-09-25 DIAGNOSIS — M6281 Muscle weakness (generalized): Secondary | ICD-10-CM | POA: Diagnosis not present

## 2023-09-25 DIAGNOSIS — R2689 Other abnormalities of gait and mobility: Secondary | ICD-10-CM

## 2023-09-25 DIAGNOSIS — R2681 Unsteadiness on feet: Secondary | ICD-10-CM

## 2023-09-25 NOTE — Therapy (Signed)
 OUTPATIENT PHYSICAL THERAPY CERVICAL AND THORACOLUMBAR TREATMENT   Patient Name: Matthew Tucker MRN: [id number] DOB:13-Feb-1951, 73 y.o., male Today's Date: 09/25/2023   END OF SESSION:  PT End of Session - 09/25/23 1102     Visit Number 19    Number of Visits 29    Date for PT Re-Evaluation 10/26/23    Authorization Type Medicare    Progress Note Due on Visit 10    PT Start Time 1100    PT Stop Time 1143    PT Time Calculation (min) 43 min    Activity Tolerance Patient tolerated treatment well    Behavior During Therapy [facility] for tasks assessed/performed                Past Medical History:  Diagnosis Date   Arthritis    Neck, bilateral hands   BACK PAIN, CHRONIC 10/31/2010   Cervical stenosis of spine    Chronic lumbar radiculopathy 05/20/2015   Diastolic dysfunction 05/21/2018   Family history of colon cancer 02-07-13   Father died at 70yo   HYPERLIPIDEMIA 2007-09-23   Impaired glucose tolerance 08/29/2011   PARESTHESIA Sep 23, 2007   Toxic effect of chlorine gas(987.6) 10/31/2010   Past Surgical History:  Procedure Laterality Date   ANTERIOR CERVICAL DECOMP/DISCECTOMY FUSION N/A 08/14/2022   Procedure: ACDF - C2-C3;  Surgeon: Joshua Alm RAMAN, MD;  Location: [facility] OR;  Service: Neurosurgery;  Laterality: N/A;   BIOPSY  10/13/2021   Procedure: BIOPSY;  Surgeon: Shila Gustav GAILS, MD;  Location: [facility];  Service: Endoscopy;;   BUNIONECTOMY     CARPAL TUNNEL RELEASE Right 08/14/2022   Procedure: Right carpal tunnel release;  Surgeon: Joshua Alm RAMAN, MD;  Location: [facility] OR;  Service: Neurosurgery;  Laterality: Right;   COLONOSCOPY     COLONOSCOPY WITH PROPOFOL  N/A 10/13/2021   Procedure: COLONOSCOPY WITH PROPOFOL ;  Surgeon: Shila Gustav GAILS, MD;  Location: [facility];  Service: Endoscopy;  Laterality: N/A;   FOOT SURGERY Right    LUMBAR LAMINECTOMY/DECOMPRESSION MICRODISCECTOMY N/A 06/20/2018   Procedure: Laminectomy and Foraminotomy - Lumbar one-Lumbar two - Lumbar  two-Lumbar three - Lumbar three-Lumbar four - Lumbar four-Lumbar five;  Surgeon: Joshua Alm RAMAN, MD;  Location: [facility] OR;  Service: Neurosurgery;  Laterality: N/A;   mass removal     back, forehead; benign (lipoma)    mass removal  2011   head; benign   POLYPECTOMY  10/13/2021   Procedure: POLYPECTOMY;  Surgeon: Shila Gustav GAILS, MD;  Location: [facility];  Service: Endoscopy;;   POSTERIOR CERVICAL FUSION/FORAMINOTOMY N/A 01/03/2018   Procedure: Posterior Cervical Fusion with lateral mass fixation - Cervical three - Cervical seven, cervical laminectomy Cervical three-cervical seven;  Surgeon: Joshua Alm RAMAN, MD;  Location[facility] OR;  Service: Neurosurgery;  Laterality: N/A;   ROOT CANAL     s/p lipoma right scalp posteriorly  2011   ULNAR NERVE TRANSPOSITION Right 08/14/2022   Procedure: Right ulnar release;  Surgeon: Joshua Alm RAMAN, MD;  Location: [facility] OR;  Service: Neurosurgery;  Laterality: Right;   Patient Active Problem List   Diagnosis Date Noted   CHF (congestive heart failure) (HCC) 12/28/2022   S/P cervical spinal fusion 08/14/2022   Muscle atrophy 05/05/2022   COVID-19 virus infection 04/06/2022   Polyp of ascending colon    Polyp of cecum    Polyp of rectum    Degeneration of lumbar intervertebral disc 12/21/2020   Osteoarthritis 12/21/2020   Rheumatoid arthritis (HCC) 12/21/2020   Vitamin D  deficiency 08/22/2019  Polyarthralgia 08/22/2019   Hypokalemia 08/22/2019   CKD (chronic kidney disease) stage 3, GFR 30-59 ml/min (HCC) 02/20/2019   Hand joint stiff, unspecified laterality 02/20/2019   S/P lumbar laminectomy 06/20/2018   Diastolic dysfunction 05/21/2018   Peripheral edema 05/17/2018   Dyspnea 05/17/2018   Cervical vertebral fusion 01/03/2018   Congenital spinal stenosis of lumbar region 08/22/2017   Bilateral hand pain 03/24/2017   Elevated blood pressure reading without diagnosis of hypertension 03/24/2017   Grief reaction 01/02/2017   Nocturia 04/28/2016    Erectile dysfunction 04/28/2016   Chronic lumbar radiculopathy 05/20/2015   Increased prostate specific antigen (PSA) velocity 05/20/2015   Paresthesia of right leg 01/29/2015   Syncope 04/13/2014   EKG abnormality 04/13/2014   Family history of colon cancer 01/17/2013   Allergic rhinitis 09/10/2011   Microhematuria 09/08/2011   Impaired glucose tolerance 08/29/2011   Colon cancer screening 08/29/2011   Backache 10/31/2010   Toxic effect of chlorine gas 10/31/2010   WEAKNESS, RIGHT SIDE OF BODY 02/28/2010   HIP PAIN, RIGHT 12/02/2007   Pain in joint, lower leg 12/02/2007   Pain in Soft Tissues of Limb 12/02/2007   Hyperlipidemia 09/02/2007   Anxiety state 09/02/2007   PARESTHESIA 09/02/2007    PCP: Norleen Lynwood ORN, MD  REFERRING PROVIDER: Norleen Lynwood ORN, MD  REFERRING DIAG:  M54.9 (ICD-10-CM) - Bilateral back pain, unspecified back location, unspecified chronicity  M54.2 (ICD-10-CM) - Neck pain    Rationale for Evaluation and Treatment: Rehabilitation  THERAPY DIAG:  Muscle weakness (generalized)  Other abnormalities of gait and mobility  Unsteadiness on feet  ONSET DATE: 06/21/23 (referral date)  SUBJECTIVE:                                                                                                                                                                                           SUBJECTIVE STATEMENT: Patient goes by Matthew Tucker.   Patient ambulated into clinic w/a limp, but reports his knee is feeling a bit better. Thinks it is arthritis. No falls. Wants to work on his stretches.   PERTINENT HISTORY:  Anterior Cervical Decompression/discetomy and fusion of C2-C3 (12/23), R carpal tunnel release (12/23), R ulnar nerve release (12/23), lumbar laminectomy/decompression microdiscetomy L1-L5 (2019), posterior cervical fusion/foraminotomy C3-C7 (2019), osteoarthritis of neck and bilateral hands, chronic back pain, cervical stenosis of spine, chronic lumbar  radiculopathy, hyperlipidemia, CHF, CKD stage 3, hx of R foot surgery  PAIN:  Are you having pain? Yes: NPRS scale: 9 Pain location: R knee Pain description: Achy     PRECAUTIONS: Fall  RED FLAGS: None   WEIGHT BEARING RESTRICTIONS: No  FALLS:  Has  patient fallen in last 6 months? No, but plenty of near misses   LIVING ENVIRONMENT: Lives with: lives with their spouse Lives in: House/apartment Stairs: Yes: Internal: 17 steps; on right going up, on left going up, and can reach both and External: 5 steps; on right going up, on left going up, and can reach both Has following equipment at home: Single point cane and Walker - 2 wheeled  OCCUPATION: Retired   PLOF: Independent  PATIENT GOALS: I just want to work on every thing   OBJECTIVE:  Note: Objective measures were completed at Evaluation unless otherwise noted.  DIAGNOSTIC FINDINGS:  No new imaging since operations on neck and low back  VITALS   There were no vitals filed for this visit.   TODAY'S TREATMENT:        Ther Ex The following exercises were performed for improved spinal mobility, pain modulation and periscapular strength:  Cat cows, x10 reps  Child's pose w/lateral reaches, x 5 per side  Quadruped thread the needles, 2x5 per side  Sidelying open books, x10 per side  Standing pallof rotations w/orange band, x10 reps per side  Bear crawl holds, 4x10s holds.  Single arm bent over rows using 12# KB, x12 reps per side.   Pt tolerated session well w/ no report of increased pain    PATIENT EDATION:  Education details: Continue HEP  Person educated: Patient Education method: Explanation, Demonstration, Verbal cues, and Handouts Education comprehension: verbalized understanding, returned demonstration, verbal cues required, and needs further education  HOME EXERCISE PROGRAM: Access Code: [email or web address] URL: [email or web address] Date: 08/31/2023 Prepared by: Lauraine Grumbling  Exercises - Sit  to Stand Without Arm Support  - 1 x daily - 7 x weekly - 3 sets - 10 reps - Tandem Walking with Counter Support  - 1 x daily - 7 x weekly - 3-4 sets - Side Stepping with Resistance at Thighs and Counter Support  - 1 x daily - 7 x weekly - 3 sets - 10 reps - Forward Backward Monster Walk with Band at Thighs and Counter Support  - 1 x daily - 7 x weekly - 3 sets - 10 reps - Standing Hip Extension with Resistance at Ankles and Counter Support  - 1 x daily - 7 x weekly - 3 sets - 10 reps - Standing Hip Abduction with Resistance at Ankles and Counter Support  - 1 x daily - 7 x weekly - 3 sets - 10 reps - Lunge with Counter Support  - 1 x daily - 7 x weekly - 2-3 sets - 8 reps - Squat with Chair Touch  - 1 x daily - 7 x weekly - 2-3 sets - 10 reps - Prone Press Up  - 1 x daily - 7 x weekly - 3 sets - 10 reps - Seated Slump Neural Tensioner  - 1 x daily - 7 x weekly - 2 sets - 10 reps  BP log and [employer] 6 week beginner walking program  ASSESSMENT:  CLINICAL IMPRESSION: Emphasis of skilled PT session on improved spinal mobility, periscapular strength and pain modulation. Pt tolerated session well but continues to be limited by R knee pain, so did not work on hip strength or dynamic balance. Pt demonstrates significant difficulty w/core stabilization exercises, so will continue to progress in clinic. Continue POC.   OBJECTIVE IMPAIRMENTS: Abnormal gait, decreased activity tolerance, decreased balance, decreased coordination, decreased endurance, decreased knowledge of condition, decreased knowledge of use of DME, decreased  mobility, difficulty walking, decreased strength, impaired flexibility, impaired sensation, impaired UE functional use, improper body mechanics, and pain  ACTIVITY LIMITATIONS: carrying, lifting, bending, sitting, standing, squatting, stairs, transfers, bathing, locomotion level, and caring for others  PARTICIPATION LIMITATIONS: meal prep,  cleaning, laundry, driving, shopping, community activity, and yard work  PERSONAL FACTORS: Age, Fitness, Past/current experiences, and 1 comorbidity: Lumbar laminectomy and C3-C7 fusion  are also affecting patient's functional outcome.    REHAB POTENTIAL: Good  CLINICAL DECISION MAKING: Stable/uncomplicated  EVALUATION COMPLEXITY: Low   GOALS: Goals reviewed with patient? Yes  SHORT TERM GOALS: Target date: 08/09/2023    Pt will be independent with initial HEP for improved strength, balance, transfers and gait.  Baseline: not established on eval  Goal status: MET  2.  Patient will improve FGA to greater than 19/30 to indicate a decreased risk of falls and improved dynamic stability.   Baseline: 17/30;25/30 (11/27) Goal status: MET  3. Patient will improve their 5x Sit to Stand score to less than 20 seconds to demonstrate a decreased risk for falls and improved LE strength.   Baseline: 22.91 seconds without UE support; 14.62s w/o UE support  Goal status: MET  4.  Pt will be compliant w/walking program for improved endurance and safety w/gait  Baseline:  Goal status: MET    LONG TERM GOALS: Target date: 09/06/2023    Pt will be independent with final HEP for improved strength, balance, transfers and gait.  Baseline: reports good understanding of exercises so far Goal status: PROGRESSING  2.  Patient will improve FGA to greater than 22/30 to indicate a decreased risk of falls and improved dynamic stability.   Baseline: 17/30; 25/30  Goal status: MET   3.  Patient will improve modified ODI score to 28% impairment or less indicate a clinically important improvement in low back pain.   Baseline: 38% impairment; 34% impairment Goal status:NOT MET  4.  Patient will improve their 5x Sit to Stand score to less than 13 seconds to demonstrate a decreased risk for falls and improved LE strength.   Baseline: 22.91 seconds without UE support; 14.62s w/o UE support (11/27);  improved to 12.47 seconds without UE use Goal status: MET  5.  Pt will improve gait velocity to at least 3.2 ft/s  for improved gait efficiency and safety  Baseline: 2.93 ft/s; improved to 3.74 ft/s Goal status: MET  LONG TERM GOALS FOLLOWING RECERT: Target date: 10/26/2023   Pt will be independent with final HEP for improved strength, balance, transfers and gait.  Baseline: reports good understanding of exercises so far Goal status: PROGRESSING  2.  Patient will improve modified ODI score to 28% impairment or less indicate a clinically important improvement in low back pain.   Baseline: 38% impairment; 34% impairment Goal status: IN PROGRESS   PLAN:  PT FREQUENCY: 2x/week  PT DURATION: 8 weeks  PLANNED INTERVENTIONS: 97164- PT Re-evaluation, 97110-Therapeutic exercises, 97530- Therapeutic activity, 97112- Neuromuscular re-education, 97535- Self Care, [id number]- Manual therapy, [phone]- Gait training, [phone]- Aquatic Therapy, [phone]- Electrical stimulation (manual), Balance training, Stair training, Dry Needling, Joint mobilization, Spinal mobilization, and DME instructions.  PLAN FOR NEXT SESSION: 20th visit PN. How is R knee? work on dynamic stability challenges requiring modified SLS and directional turns, review waking program, redirect as needed to keep patient on task , hip strength, heel taps, toe sweeps   Work on sciatic/radicular pain  Ailish Prospero E Ector Laurel, PT, DPT 09/25/2023, 11:48 AM

## 2023-09-27 ENCOUNTER — Encounter: Payer: Self-pay | Admitting: Physical Therapy

## 2023-09-27 ENCOUNTER — Ambulatory Visit: Payer: Medicare Other | Admitting: Physical Therapy

## 2023-09-27 VITALS — BP 123/78 | HR 67

## 2023-09-27 DIAGNOSIS — R2681 Unsteadiness on feet: Secondary | ICD-10-CM

## 2023-09-27 DIAGNOSIS — R2689 Other abnormalities of gait and mobility: Secondary | ICD-10-CM

## 2023-09-27 DIAGNOSIS — M6281 Muscle weakness (generalized): Secondary | ICD-10-CM | POA: Diagnosis not present

## 2023-09-27 NOTE — Therapy (Signed)
OUTPATIENT PHYSICAL THERAPY CERVICAL AND THORACOLUMBAR TREATMENT / 20th VISIT PROGRESS NOTE / DISCHARGE  Patient Name: Matthew Tucker MRN: [id number] DOB:13-Dec-1950, 73 y.o., male Today's Date: 09/27/2023   END OF SESSION:  PT End of Session - 09/27/23 1017     Visit Number 20    Number of Visits 29    Date for PT Re-Evaluation 10/26/23    Authorization Type Medicare    Progress Note Due on Visit 10    PT Start Time 1015    PT Stop Time 1037    PT Time Calculation (min) 22 min    Equipment Utilized During Treatment Gait belt    Activity Tolerance Patient tolerated treatment well    Behavior During Therapy WFL for tasks assessed/performed             Past Medical History:  Diagnosis Date   Arthritis    Neck, bilateral hands   BACK PAIN, CHRONIC 10/31/2010   Cervical stenosis of spine    Chronic lumbar radiculopathy 05/20/2015   Diastolic dysfunction 05/21/2018   Family history of colon cancer 02/04/13   Father died at 70yo   HYPERLIPIDEMIA Sep 20, 2007   Impaired glucose tolerance 08/29/2011   PARESTHESIA 09-20-2007   Toxic effect of chlorine gas(987.6) 10/31/2010   Past Surgical History:  Procedure Laterality Date   ANTERIOR CERVICAL DECOMP/DISCECTOMY FUSION N/A 08/14/2022   Procedure: ACDF - C2-C3;  Surgeon: Tia Alert, MD;  Location: [facility] OR;  Service: Neurosurgery;  Laterality: N/A;   BIOPSY  10/13/2021   Procedure: BIOPSY;  Surgeon: Napoleon Form, MD;  Location: [facility];  Service: Endoscopy;;   BUNIONECTOMY     CARPAL TUNNEL RELEASE Right 08/14/2022   Procedure: Right carpal tunnel release;  Surgeon: Tia Alert, MD;  Location: [facility] OR;  Service: Neurosurgery;  Laterality: Right;   COLONOSCOPY     COLONOSCOPY WITH PROPOFOL N/A 10/13/2021   Procedure: COLONOSCOPY WITH PROPOFOL;  Surgeon: Napoleon Form, MD;  Location: [facility];  Service: Endoscopy;  Laterality: N/A;   FOOT SURGERY Right    LUMBAR LAMINECTOMY/DECOMPRESSION MICRODISCECTOMY N/A  06/20/2018   Procedure: Laminectomy and Foraminotomy - Lumbar one-Lumbar two - Lumbar two-Lumbar three - Lumbar three-Lumbar four - Lumbar four-Lumbar five;  Surgeon: Tia Alert, MD;  Location: [facility] OR;  Service: Neurosurgery;  Laterality: N/A;   mass removal     back, forehead; benign (lipoma)    mass removal  2011   head; benign   POLYPECTOMY  10/13/2021   Procedure: POLYPECTOMY;  Surgeon: Napoleon Form, MD;  [facility]: [facility];  Service: Endoscopy;;   POSTERIOR CERVICAL FUSION/FORAMINOTOMY N/A 01/03/2018   Procedure: Posterior Cervical Fusion with lateral mass fixation - Cervical three - Cervical seven, cervical laminectomy Cervical three-cervical seven;  Surgeon: Tia Alert, MD;  Location: [facility] OR;  Service: Neurosurgery;  Laterality: N/A;   ROOT CANAL     s/p lipoma right scalp posteriorly  2011   ULNAR NERVE TRANSPOSITION Right 08/14/2022   Procedure: Right ulnar release;  Surgeon: Tia Alert, MD;  Location: [facility] OR;  Service: Neurosurgery;  Laterality: Right;   Patient Active Problem List   Diagnosis Date Noted   CHF (congestive heart failure) (HCC) 12/28/2022   S/P cervical spinal fusion 08/14/2022   Muscle atrophy 05/05/2022   COVID-19 virus infection 04/06/2022   Polyp of ascending colon    Polyp of cecum    Polyp of rectum    Degeneration of lumbar intervertebral disc 12/21/2020   Osteoarthritis 12/21/2020  Rheumatoid arthritis (HCC) 12/21/2020   Vitamin D deficiency 08/22/2019   Polyarthralgia 08/22/2019   Hypokalemia 08/22/2019   CKD (chronic kidney disease) stage 3, GFR 30-59 ml/min (HCC) 02/20/2019   Hand joint stiff, unspecified laterality 02/20/2019   S/P lumbar laminectomy 06/20/2018   Diastolic dysfunction 05/21/2018   Peripheral edema 05/17/2018   Dyspnea 05/17/2018   Cervical vertebral fusion 01/03/2018   Congenital spinal stenosis of lumbar region 08/22/2017   Bilateral hand pain 03/24/2017   Elevated blood pressure reading without diagnosis  of hypertension 03/24/2017   Grief reaction 01/02/2017   Nocturia 04/28/2016   Erectile dysfunction 04/28/2016   Chronic lumbar radiculopathy 05/20/2015   Increased prostate specific antigen (PSA) velocity 05/20/2015   Paresthesia of right leg 01/29/2015   Syncope 04/13/2014   EKG abnormality 04/13/2014   Family history of colon cancer 01/17/2013   Allergic rhinitis 09/10/2011   Microhematuria 09/08/2011   Impaired glucose tolerance 08/29/2011   Colon cancer screening 08/29/2011   Backache 10/31/2010   Toxic effect of chlorine gas 10/31/2010   WEAKNESS, RIGHT SIDE OF BODY 02/28/2010   HIP PAIN, RIGHT 12/02/2007   Pain in joint, lower leg 12/02/2007   Pain in Soft Tissues of Limb 12/02/2007   Hyperlipidemia 09/02/2007   Anxiety state 09/02/2007   PARESTHESIA 09/02/2007    PCP: Corwin Levins, MD  REFERRING PROVIDER: Corwin Levins, MD  REFERRING DIAG:  M54.9 (ICD-10-CM) - Bilateral back pain, unspecified back location, unspecified chronicity  M54.2 (ICD-10-CM) - Neck pain    Rationale for Evaluation and Treatment: Rehabilitation  THERAPY DIAG:  Muscle weakness (generalized)  Other abnormalities of gait and mobility  Unsteadiness on feet  ONSET DATE: 06/21/23 (referral date)  SUBJECTIVE:                                                                                                                                                                                           SUBJECTIVE STATEMENT: Patient goes by "Matthew Tucker."   Patient ambulated into clinic still with limp but reports some improvement in pain. Patient is agreeable to discharge at this time.  PERTINENT HISTORY:  Anterior Cervical Decompression/discetomy and fusion of C2-C3 (12/23), R carpal tunnel release (12/23), R ulnar nerve release (12/23), lumbar laminectomy/decompression microdiscetomy L1-L5 (2019), posterior cervical fusion/foraminotomy C3-C7 (2019), osteoarthritis of neck and bilateral hands, chronic back  pain, cervical stenosis of spine, chronic lumbar radiculopathy, hyperlipidemia, CHF, CKD stage 3, hx of R foot surgery  PAIN:  Are you having pain? Yes: NPRS scale: 4/10 Pain location: R knee Pain description: Achy     PRECAUTIONS: Fall  RED FLAGS: None   WEIGHT BEARING RESTRICTIONS:  No  FALLS:  Has patient fallen in last 6 months? No, but plenty of near misses   LIVING ENVIRONMENT: Lives with: lives with their spouse Lives in: House/apartment Stairs: Yes: Internal: 17 steps; on right going up, on left going up, and can reach both and External: 5 steps; on right going up, on left going up, and can reach both Has following equipment at home: Single point cane and Walker - 2 wheeled  OCCUPATION: Retired   PLOF: Independent  PATIENT GOALS: "I just want to work on every thing"   OBJECTIVE:  Note: Objective measures were completed at Evaluation unless otherwise noted.  DIAGNOSTIC FINDINGS:  No new imaging since operations on neck and low back  VITALS   Vitals:   09/27/23 1020  BP: 123/78  Pulse: 67  Seated on LUE   TODAY'S TREATMENT:        TherAct: Modified Oswestry Disability Index: 15/50 = 30% impairment  Reviewed verbal progress on goals as well as verbally reviewed HEP. Patient reports compliance and no further questions about HEP at this time.   PATIENT EDATION:  Education details: Continue HEP + Progress on goals Person educated: Patient Education method: Explanation, Demonstration, Verbal cues, and Handouts Education comprehension: verbalized understanding, returned demonstration, and verbal cues required  HOME EXERCISE PROGRAM: Access Code: [email or web address] URL: [email or web address] Date: 08/31/2023 Prepared by: Maryruth Eve  Exercises - Sit to Stand Without Arm Support  - 1 x daily - 7 x weekly - 3 sets - 10 reps - Tandem Walking with Counter Support  - 1 x daily - 7 x weekly - 3-4 sets - Side Stepping with Resistance at Thighs and Counter  Support  - 1 x daily - 7 x weekly - 3 sets - 10 reps - Forward Backward Monster Walk with Band at Thighs and Counter Support  - 1 x daily - 7 x weekly - 3 sets - 10 reps - Standing Hip Extension with Resistance at Ankles and Counter Support  - 1 x daily - 7 x weekly - 3 sets - 10 reps - Standing Hip Abduction with Resistance at Ankles and Counter Support  - 1 x daily - 7 x weekly - 3 sets - 10 reps - Lunge with Counter Support  - 1 x daily - 7 x weekly - 2-3 sets - 8 reps - Squat with Chair Touch  - 1 x daily - 7 x weekly - 2-3 sets - 10 reps - Prone Press Up  - 1 x daily - 7 x weekly - 3 sets - 10 reps - Seated Slump Neural Tensioner  - 1 x daily - 7 x weekly - 2 sets - 10 reps  BP log and [employer] 6 week beginner walking program  ASSESSMENT:  CLINICAL IMPRESSION: Patient discharging from skilled physical therapy services due to maximized rehab potential at this time. Patient demonstrates progress on all LTGs and is independent in final HEP. Patient agreeable to D/C at this time.   OBJECTIVE IMPAIRMENTS: Abnormal gait, decreased activity tolerance, decreased balance, decreased coordination, decreased endurance, decreased knowledge of condition, decreased knowledge of use of DME, decreased mobility, difficulty walking, decreased strength, impaired flexibility, impaired sensation, impaired UE functional use, improper body mechanics, and pain  ACTIVITY LIMITATIONS: carrying, lifting, bending, sitting, standing, squatting, stairs, transfers, bathing, locomotion level, and caring for others  PARTICIPATION LIMITATIONS: meal prep, cleaning, laundry, driving, shopping, community activity, and yard work  PERSONAL FACTORS: Age, Fitness, Past/current experiences, and 1 comorbidity: Lumbar  laminectomy and C3-C7 fusion  are also affecting patient's functional outcome.    REHAB POTENTIAL: Good  CLINICAL DECISION MAKING: Stable/uncomplicated  EVALUATION COMPLEXITY:  Low   GOALS: Goals reviewed with patient? Yes  SHORT TERM GOALS: Target date: 08/09/2023    Pt will be independent with initial HEP for improved strength, balance, transfers and gait.  Baseline: not established on eval  Goal status: MET  2.  Patient will improve FGA to greater than 19/30 to indicate a decreased risk of falls and improved dynamic stability.   Baseline: 17/30;25/30 (11/27) Goal status: MET  3. Patient will improve their 5x Sit to Stand score to less than 20 seconds to demonstrate a decreased risk for falls and improved LE strength.   Baseline: 22.91 seconds without UE support; 14.62s w/o UE support  Goal status: MET  4.  Pt will be compliant w/walking program for improved endurance and safety w/gait  Baseline:  Goal status: MET    LONG TERM GOALS: Target date: 09/06/2023    Pt will be independent with final HEP for improved strength, balance, transfers and gait.  Baseline: reports good understanding of exercises so far Goal status: PROGRESSING  2.  Patient will improve FGA to greater than 22/30 to indicate a decreased risk of falls and improved dynamic stability.   Baseline: 17/30; 25/30  Goal status: MET   3.  Patient will improve modified ODI score to 28% impairment or less indicate a clinically important improvement in low back pain.   Baseline: 38% impairment; 34% impairment Goal status:NOT MET  4.  Patient will improve their 5x Sit to Stand score to less than 13 seconds to demonstrate a decreased risk for falls and improved LE strength.   Baseline: 22.91 seconds without UE support; 14.62s w/o UE support (11/27); improved to 12.47 seconds without UE use Goal status: MET  5.  Pt will improve gait velocity to at least 3.2 ft/s  for improved gait efficiency and safety  Baseline: 2.93 ft/s; improved to 3.74 ft/s Goal status: MET  LONG TERM GOALS FOLLOWING RECERT: Target date: 10/26/2023   Pt will be independent with final HEP for improved  strength, balance, transfers and gait.  Baseline: reports good understanding of exercises so far, reports feeling confident in final HEP Goal status: MET  2.  Patient will improve modified ODI score to 28% impairment or less indicate a clinically important improvement in low back pain.   Baseline: 38% impairment; 34% impairment, 30% impairment Goal status: NOT MET   PLAN:  PT FREQUENCY: 2x/week  PT DURATION: 8 weeks  PLANNED INTERVENTIONS: 97164- PT Re-evaluation, 97110-Therapeutic exercises, 97530- Therapeutic activity, 97112- Neuromuscular re-education, 97535- Self Care, 78295- Manual therapy, [phone]- Gait training, [phone]- Aquatic Therapy, [phone]- Electrical stimulation (manual), Balance training, Stair training, Dry Needling, Joint mobilization, Spinal mobilization, and DME instructions.  PLAN FOR NEXT SESSION: NA - D/C with updated HEP  Maryruth Eve, PT, DPT  09/27/2023, 10:48 AM

## 2023-10-02 ENCOUNTER — Ambulatory Visit: Payer: Medicare Other | Admitting: Physical Therapy

## 2023-10-04 ENCOUNTER — Ambulatory Visit: Payer: Medicare Other | Admitting: Physical Therapy

## 2023-10-09 ENCOUNTER — Ambulatory Visit: Payer: Medicare Other | Admitting: Physical Therapy

## 2023-10-11 ENCOUNTER — Ambulatory Visit: Payer: Medicare Other | Admitting: Physical Therapy

## 2023-10-15 ENCOUNTER — Other Ambulatory Visit: Payer: Self-pay | Admitting: Internal Medicine

## 2023-10-15 NOTE — Telephone Encounter (Signed)
Copied from CRM [id number]. Topic: Clinical - Medication Refill >> Oct 15, 2023  1:35 PM Leavy Cella D wrote: Most Recent Primary Care Visit:  Provider: Wyvonne Lenz  Department: [facility]  Visit Type: MEDICARE AWV, SEQUENTIAL  Date: 06/21/2023  Medication: furosemide (LASIX) 40 MG tablet  Has the patient contacted their pharmacy? Yes (Agent: If no, request that the patient contact the pharmacy for the refill. If patient does not wish to contact the pharmacy document the reason why and proceed with request.) (Agent: If yes, when and what did the pharmacy advise?) Pharmacy advised patient to call provider to request refill   Is this the correct pharmacy for this prescription? Yes If no, delete pharmacy and type the correct one.  This is the patient's preferred pharmacy:  [facility] [address], [place] - [address] AT [facility] [address] [place] [address] Phone: [phone] Fax: [phone]   Has the prescription been filled recently? No  Is the patient out of the medication? Yes  Has the patient been seen for an appointment in the last year OR does the patient have an upcoming appointment? Yes  Can we respond through MyChart? No  Agent: Please be advised that Rx refills may take up to 3 business days. We ask that you follow-up with your pharmacy.

## 2023-10-16 ENCOUNTER — Ambulatory Visit: Payer: Medicare Other | Admitting: Physical Therapy

## 2023-10-18 ENCOUNTER — Ambulatory Visit: Payer: Medicare Other | Admitting: Physical Therapy

## 2023-10-19 ENCOUNTER — Other Ambulatory Visit: Payer: Self-pay | Admitting: Internal Medicine

## 2023-10-25 DIAGNOSIS — H25043 Posterior subcapsular polar age-related cataract, bilateral: Secondary | ICD-10-CM | POA: Diagnosis not present

## 2023-10-25 DIAGNOSIS — H25013 Cortical age-related cataract, bilateral: Secondary | ICD-10-CM | POA: Diagnosis not present

## 2023-10-25 DIAGNOSIS — H18413 Arcus senilis, bilateral: Secondary | ICD-10-CM | POA: Diagnosis not present

## 2023-10-25 DIAGNOSIS — H2513 Age-related nuclear cataract, bilateral: Secondary | ICD-10-CM | POA: Diagnosis not present

## 2023-10-25 DIAGNOSIS — H2512 Age-related nuclear cataract, left eye: Secondary | ICD-10-CM | POA: Diagnosis not present

## 2023-12-05 DIAGNOSIS — H2512 Age-related nuclear cataract, left eye: Secondary | ICD-10-CM | POA: Diagnosis not present

## 2023-12-06 DIAGNOSIS — H2511 Age-related nuclear cataract, right eye: Secondary | ICD-10-CM | POA: Diagnosis not present

## 2024-01-02 DIAGNOSIS — H2511 Age-related nuclear cataract, right eye: Secondary | ICD-10-CM | POA: Diagnosis not present

## 2024-01-16 ENCOUNTER — Ambulatory Visit: Admitting: Internal Medicine

## 2024-01-16 ENCOUNTER — Encounter: Payer: Self-pay | Admitting: Internal Medicine

## 2024-01-16 VITALS — BP 136/82 | HR 71 | Temp 98.1°F | Ht 70.0 in | Wt 213.6 lb

## 2024-01-16 DIAGNOSIS — E7800 Pure hypercholesterolemia, unspecified: Secondary | ICD-10-CM | POA: Diagnosis not present

## 2024-01-16 DIAGNOSIS — E559 Vitamin D deficiency, unspecified: Secondary | ICD-10-CM

## 2024-01-16 DIAGNOSIS — R7302 Impaired glucose tolerance (oral): Secondary | ICD-10-CM | POA: Diagnosis not present

## 2024-01-16 DIAGNOSIS — E538 Deficiency of other specified B group vitamins: Secondary | ICD-10-CM | POA: Diagnosis not present

## 2024-01-16 DIAGNOSIS — N320 Bladder-neck obstruction: Secondary | ICD-10-CM | POA: Diagnosis not present

## 2024-01-16 DIAGNOSIS — N1831 Chronic kidney disease, stage 3a: Secondary | ICD-10-CM

## 2024-01-16 LAB — URINALYSIS, ROUTINE W REFLEX MICROSCOPIC
Bilirubin Urine: NEGATIVE
Ketones, ur: NEGATIVE
Leukocytes,Ua: NEGATIVE
Nitrite: NEGATIVE
Specific Gravity, Urine: 1.01 (ref 1.000–1.030)
Total Protein, Urine: NEGATIVE
Urine Glucose: NEGATIVE
Urobilinogen, UA: 0.2 (ref 0.0–1.0)
pH: 6 (ref 5.0–8.0)

## 2024-01-16 LAB — BASIC METABOLIC PANEL WITH GFR
BUN: 17 mg/dL (ref 6–23)
CO2: 29 meq/L (ref 19–32)
Calcium: 9.2 mg/dL (ref 8.4–10.5)
Chloride: 104 meq/L (ref 96–112)
Creatinine, Ser: 1.25 mg/dL (ref 0.40–1.50)
GFR: 56.59 mL/min — ABNORMAL LOW (ref >60.00)
Glucose, Bld: 82 mg/dL (ref 70–99)
Potassium: 3.7 meq/L (ref 3.5–5.1)
Sodium: 141 meq/L (ref 135–145)

## 2024-01-16 LAB — LIPID PANEL
Cholesterol: 170 mg/dL (ref 0–200)
HDL: 61.7 mg/dL (ref >39.00)
LDL Cholesterol: 100 mg/dL — ABNORMAL HIGH (ref 0–99)
NonHDL: 108.52
Total CHOL/HDL Ratio: 3
Triglycerides: 43 mg/dL (ref 0.0–149.0)
VLDL: 8.6 mg/dL (ref 0.0–40.0)

## 2024-01-16 LAB — CBC WITH DIFFERENTIAL/PLATELET
Basophils Absolute: 0.1 10*3/uL (ref 0.0–0.1)
Basophils Relative: 0.8 % (ref 0.0–3.0)
Eosinophils Absolute: 0.2 10*3/uL (ref 0.0–0.7)
Eosinophils Relative: 3.6 % (ref 0.0–5.0)
HCT: 41.2 % (ref 39.0–52.0)
Hemoglobin: 13.8 g/dL (ref 13.0–17.0)
Lymphocytes Relative: 47.2 % — ABNORMAL HIGH (ref 12.0–46.0)
Lymphs Abs: 2.8 10*3/uL (ref 0.7–4.0)
MCHC: 33.5 g/dL (ref 30.0–36.0)
MCV: 91.3 fl (ref 78.0–100.0)
Monocytes Absolute: 0.6 10*3/uL (ref 0.1–1.0)
Monocytes Relative: 9.6 % (ref 3.0–12.0)
Neutro Abs: 2.3 10*3/uL (ref 1.4–7.7)
Neutrophils Relative %: 38.8 % — ABNORMAL LOW (ref 43.0–77.0)
Platelets: 182 10*3/uL (ref 150.0–400.0)
RBC: 4.51 Mil/uL (ref 4.22–5.81)
RDW: 12.9 % (ref 11.5–15.5)
WBC: 6 10*3/uL (ref 4.0–10.5)

## 2024-01-16 LAB — HEPATIC FUNCTION PANEL
ALT: 16 U/L (ref 0–53)
AST: 20 U/L (ref 0–37)
Albumin: 4.3 g/dL (ref 3.5–5.2)
Alkaline Phosphatase: 74 U/L (ref 39–117)
Bilirubin, Direct: 0.2 mg/dL (ref 0.0–0.3)
Total Bilirubin: 1.1 mg/dL (ref 0.2–1.2)
Total Protein: 6.9 g/dL (ref 6.0–8.3)

## 2024-01-16 LAB — VITAMIN B12: Vitamin B-12: 436 pg/mL (ref 211–911)

## 2024-01-16 LAB — MICROALBUMIN / CREATININE URINE RATIO
Creatinine,U: 42.4 mg/dL
Microalb Creat Ratio: UNDETERMINED mg/g (ref 0.0–30.0)
Microalb, Ur: <0.7 mg/dL

## 2024-01-16 LAB — HEMOGLOBIN A1C: Hgb A1c MFr Bld: 5.5 % (ref 4.6–6.5)

## 2024-01-16 LAB — TSH: TSH: 3.55 u[IU]/mL (ref 0.35–5.50)

## 2024-01-16 LAB — PSA: PSA: 3.66 ng/mL (ref 0.10–4.00)

## 2024-01-16 LAB — VITAMIN D 25 HYDROXY (VIT D DEFICIENCY, FRACTURES): VITD: 27.54 ng/mL — ABNORMAL LOW (ref 30.00–100.00)

## 2024-01-16 MED ORDER — CYCLOBENZAPRINE HCL 5 MG PO TABS: 5.0000 mg | ORAL_TABLET | Freq: Three times a day (TID) | ORAL | 1 refills | Status: AC | PRN

## 2024-01-16 MED ORDER — ROSUVASTATIN CALCIUM 40 MG PO TABS: 40.0000 mg | ORAL_TABLET | Freq: Every day | ORAL | 3 refills | Status: AC

## 2024-01-16 MED ORDER — FUROSEMIDE 40 MG PO TABS: 40.0000 mg | ORAL_TABLET | Freq: Every day | ORAL | 3 refills | Status: AC

## 2024-01-16 MED ORDER — POTASSIUM CHLORIDE ER 10 MEQ PO TBCR: 10.0000 meq | EXTENDED_RELEASE_TABLET | Freq: Every day | ORAL | 3 refills | Status: AC

## 2024-01-16 NOTE — Assessment & Plan Note (Signed)
Lab Results  Component Value Date   HGBA1C 5.4 12/26/2022   Stable, pt to continue current medical treatment  - diet, wt control  

## 2024-01-16 NOTE — Assessment & Plan Note (Signed)
Lab Results  Component Value Date   CREATININE 1.30 01/17/2023   Stable overall, cont to avoid nephrotoxins

## 2024-01-16 NOTE — Assessment & Plan Note (Signed)
 Lab Results  Component Value Date   LDLCALC 81 12/26/2022   Uncontrolled,, pt to continue current statin crestor  40 mg and f/u lab today

## 2024-01-16 NOTE — Progress Notes (Signed)
 Patient ID: Matthew Tucker, male   DOB: 03-23-1951, 73 y.o.   MRN: [id number]        Chief Complaint: follow up HLD and hyperglycemia , ckd3a, low vit d       HPI:  Matthew Tucker is a 73 y.o. male here overall doing well, Pt denies chest pain, increased sob or doe, wheezing, orthopnea, PND, increased LE swelling, palpitations, dizziness or syncope.   Pt denies polydipsia, polyuria, or new focal neuro s/s.    Pt denies fever, wt loss, night sweats, loss of appetite, or other constitutional symptoms  Pt continues to have recurring LBP without change in severity, bowel or bladder change, fever, wt loss,  worsening LE pain/numbness/weakness, gait change or falls, asking for change of robaxin  as not working well recently.     Wt Readings from Last 3 Encounters:  01/16/24 213 lb 9.6 oz (96.9 kg)  06/21/23 209 lb 9.6 oz (95.1 kg)  01/17/23 212 lb (96.2 kg)   BP Readings from Last 3 Encounters:  01/16/24 136/82  09/27/23 123/78  08/31/23 (!) 146/72         Past Medical History:  Diagnosis Date   Arthritis    Neck, bilateral hands   BACK PAIN, CHRONIC 10/31/2010   Cervical stenosis of spine    Chronic lumbar radiculopathy 05/20/2015   Diastolic dysfunction 05/21/2018   Family history of colon cancer Feb 05, 2013   Father died at 70yo   HYPERLIPIDEMIA 09-21-07   Impaired glucose tolerance 08/29/2011   PARESTHESIA Sep 21, 2007   Toxic effect of chlorine gas(987.6) 10/31/2010   Past Surgical History:  Procedure Laterality Date   ANTERIOR CERVICAL DECOMP/DISCECTOMY FUSION N/A 08/14/2022   Procedure: ACDF - C2-C3;  Surgeon: Isadora Mar, MD;  Location: [facility] OR;  Service: Neurosurgery;  Laterality: N/A;   BIOPSY  10/13/2021   Procedure: BIOPSY;  Surgeon: Sergio Dandy, MD;  [facility];  Service: Endoscopy;;   BUNIONECTOMY     CARPAL TUNNEL RELEASE Right 08/14/2022   Procedure: Right carpal tunnel release;  Surgeon: Isadora Mar, MD;  Location: [facility] OR;  Service: Neurosurgery;   Laterality: Right;   COLONOSCOPY     COLONOSCOPY WITH PROPOFOL  N/A 10/13/2021   Procedure: COLONOSCOPY WITH PROPOFOL ;  Surgeon: Sergio Dandy, MD;  Location: [facility];  Service: Endoscopy;  Laterality: N/A;   FOOT SURGERY Right    LUMBAR LAMINECTOMY/DECOMPRESSION MICRODISCECTOMY N/A 06/20/2018   Procedure: Laminectomy and Foraminotomy - Lumbar one-Lumbar two - Lumbar two-Lumbar three - Lumbar three-Lumbar four - Lumbar four-Lumbar five;  Surgeon: Isadora Mar, MD;  Location: [facility] OR;  Service: Neurosurgery;  Laterality: N/A;   mass removal     back, forehead; benign (lipoma)    mass removal  2011   head; benign   POLYPECTOMY  10/13/2021   Procedure: POLYPECTOMY;  Surgeon: Sergio Dandy, MD;  Location: [facility];  Service: Endoscopy;;   POSTERIOR CERVICAL FUSION/FORAMINOTOMY N/A 01/03/2018   Procedure: Posterior Cervical Fusion with lateral mass fixation - Cervical three - Cervical seven, cervical laminectomy Cervical three-cervical seven;  Surgeon: Isadora Mar, MD;  Location: [facility] OR;  Service: Neurosurgery;  Laterality: N/A;   ROOT CANAL     s/p lipoma right scalp posteriorly  2011   ULNAR NERVE TRANSPOSITION Right 08/14/2022   Procedure: Right ulnar release;  Surgeon: Isadora Mar, MD;  Location: [facility] OR;  Service: Neurosurgery;  Laterality: Right;    reports that he has never smoked. He has never used smokeless tobacco.  He reports current alcohol use. He reports that he does not currently use drugs after having used the following drugs: Marijuana. family history includes Cancer in his mother; Colon cancer (age of onset: 17) in his father; Stroke (age of onset: 4) in his brother. Allergies  Allergen Reactions   Other Other (See Comments)    Nivaquine. Found in [place] and [place] for Malaria treatment  UNSPECIFIED REACTION    Levofloxacin Other (See Comments)    Irritates gums   Current Outpatient Medications on File Prior to Visit  Medication Sig Dispense Refill    aspirin  EC 81 MG tablet Take 81 mg by mouth daily.     ibuprofen  (ADVIL ) 600 MG tablet TAKE 1 TABLET(600 MG) BY MOUTH EVERY 8 HOURS AS NEEDED 60 tablet 2   sildenafil  (VIAGRA ) 100 MG tablet Take 0.5-1 tablets (50-100 mg total) by mouth daily as needed for erectile dysfunction. 10 tablet 11   tiZANidine  (ZANAFLEX ) 2 MG tablet Take 1 tablet (2 mg total) by mouth every 6 (six) hours as needed for muscle spasms. 60 tablet 2   No current facility-administered medications on file prior to visit.        ROS:  All others reviewed and negative.  Objective        PE:  BP 136/82   Pulse 71   Temp 98.1 F (36.7 C) (Oral)   Ht 5\' 10"  (1.778 m)   Wt 213 lb 9.6 oz (96.9 kg)   SpO2 97%   BMI 30.65 kg/m                 Constitutional: Pt appears in NAD               HENT: Head: NCAT.                Right Ear: External ear normal.                 Left Ear: External ear normal.                Eyes: . Pupils are equal, round, and reactive to light. Conjunctivae and EOM are normal               Nose: without d/c or deformity               Neck: Neck supple. Gross normal ROM               Cardiovascular: Normal rate and regular rhythm.                 Pulmonary/Chest: Effort normal and breath sounds without rales or wheezing.                Abd:  Soft, NT, ND, + BS, no organomegaly               Neurological: Pt is alert. At baseline orientation, motor grossly intact               Skin: Skin is warm. No rashes, no other new lesions, LE edema - none               Psychiatric: Pt behavior is normal without agitation   Micro: none  Cardiac tracings I have personally interpreted today:  none  Pertinent Radiological findings (summarize): none   Lab Results  Component Value Date   WBC 6.7 12/26/2022   HGB 14.0 12/26/2022   HCT 42.1 12/26/2022   PLT 172.0 12/26/2022  GLUCOSE 87 01/17/2023   CHOL 163 12/26/2022   TRIG 57.0 12/26/2022   HDL 70.40 12/26/2022   LDLDIRECT 147.4 01/10/2013    LDLCALC 81 12/26/2022   ALT 14 12/26/2022   AST 24 12/26/2022   NA 141 01/17/2023   K 2.9 (L) 01/17/2023   CL 100 01/17/2023   CREATININE 1.30 01/17/2023   BUN 18 01/17/2023   CO2 31 01/17/2023   TSH 3.61 12/26/2022   PSA 2.38 12/26/2022   INR 1.0 08/10/2022   HGBA1C 5.4 12/26/2022   Assessment/Plan:  Matthew Tucker is a 73 y.o. Black or African American [2] male with  has a past medical history of Arthritis, BACK PAIN, CHRONIC (10/31/2010), Cervical stenosis of spine, Chronic lumbar radiculopathy (05/20/2015), Diastolic dysfunction (05/21/2018), Family history of colon cancer (01/17/2013), HYPERLIPIDEMIA (09/02/2007), Impaired glucose tolerance (08/29/2011), PARESTHESIA (09/02/2007), and Toxic effect of chlorine gas(987.6) (10/31/2010).  Hyperlipidemia Lab Results  Component Value Date   LDLCALC 81 12/26/2022   Uncontrolled,, pt to continue current statin crestor  40 mg and f/u lab today   Impaired glucose tolerance Lab Results  Component Value Date   HGBA1C 5.4 12/26/2022   Stable, pt to continue current medical treatment  - diet, wt control   CKD (chronic kidney disease) stage 3, GFR 30-59 ml/min (HCC) Lab Results  Component Value Date   CREATININE 1.30 01/17/2023   Stable overall, cont to avoid nephrotoxins   Vitamin D  deficiency Last vitamin D  Lab Results  Component Value Date   VD25OH 22.14 (L) 12/26/2022   Low, to start oral replacement  Followup: Return in about 6 months (around 07/18/2024).  Rosalia Colonel, MD 01/16/2024 1:04 PM [facility] Internal Medicine

## 2024-01-16 NOTE — Patient Instructions (Signed)

## 2024-01-16 NOTE — Assessment & Plan Note (Signed)
Last vitamin D Lab Results  Component Value Date   VD25OH 22.14 (L) 12/26/2022   Low, to start oral replacement  

## 2024-01-17 ENCOUNTER — Encounter: Payer: Self-pay | Admitting: Internal Medicine

## 2024-01-21 ENCOUNTER — Other Ambulatory Visit: Payer: Self-pay | Admitting: Internal Medicine

## 2024-01-21 NOTE — Telephone Encounter (Signed)
 Copied from CRM [id number]. Topic: Clinical - Medication Refill >> Jan 21, 2024 10:20 AM Earnestine Goes B wrote: Medication: tiZANidine  (ZANAFLEX ) 2 MG tablet  Has the patient contacted their pharmacy? Yes (Agent: If no, request that the patient contact the pharmacy for the refill. If patient does not wish to contact the pharmacy document the reason why and proceed with request.) (Agent: If yes, when and what did the pharmacy advise?)  This is the patient's preferred pharmacy:  [facility] ST[facility] #[id number] [place] - [address] AT [facility] [address] [place] [address] Phone: [phone] Fax: [phone]  Is this the correct pharmacy for this prescription? Yes If no, delete pharmacy and type the correct one.   Has the prescription been filled recently? Yes  Is the patient out of the medication? Yes  Has the patient been seen for an appointment in the last year OR does the patient have an upcoming appointment? Yes  Can we respond through MyChart? Yes  Agent: Please be advised that Rx refills may take up to 3 business days. We ask that you follow-up with your pharmacy.

## 2024-01-21 NOTE — Telephone Encounter (Signed)
 Last Fill: 08/15/22  Last OV: 01/16/24 Next OV: 06/23/24  Routing to provider for review/authorization.

## 2024-01-22 ENCOUNTER — Other Ambulatory Visit: Payer: Self-pay

## 2024-01-22 MED ORDER — TIZANIDINE HCL 2 MG PO TABS: 2.0000 mg | ORAL_TABLET | Freq: Four times a day (QID) | ORAL | 2 refills | Status: AC | PRN

## 2024-01-24 ENCOUNTER — Other Ambulatory Visit (HOSPITAL_COMMUNITY): Payer: Self-pay

## 2024-01-24 ENCOUNTER — Telehealth: Payer: Self-pay

## 2024-01-24 NOTE — Telephone Encounter (Signed)
 Pharmacy Patient Advocate Encounter  Received notification from [employer] that Prior Authorization for Tizanidine  has been not needed.   PA #/Case ID/Reference #: [id number]

## 2024-01-24 NOTE — Telephone Encounter (Signed)
 Pharmacy Patient Advocate Encounter   Received notification from Patient Pharmacy that prior authorization for Tizanidine  2 is required/requested.   Insurance verification completed.   The patient is insured through [employer] .   Per test claim: PA required; PA submitted to above mentioned insurance via CoverMyMeds Key/confirmation #/EOC [id number] Status is pending

## 2024-03-11 ENCOUNTER — Telehealth: Payer: Self-pay | Admitting: Internal Medicine

## 2024-03-11 NOTE — Telephone Encounter (Signed)
 Copied from CRM [id number]. Topic: General - Other >> Mar 11, 2024 12:18 PM Robinson H wrote: Reason for CRM: Patient calling to check status of measles vaccination status.  [place] [phone]

## 2024-03-12 NOTE — Telephone Encounter (Signed)
 Normally the measles booster is not needed if you are born before 1967      Also the monkey pox if only contracted sexually typically from another man, so unless he thinks this might be the case, he would not be recommended to get this .   I think it can only be done at the [facility], as we do not stock this vaccine at this office.   thanks

## 2024-03-17 NOTE — Telephone Encounter (Signed)
 Called pt and relayed MD response, pt verbalized understanding and states he may end up getting monkey pox vaccine anyway as it is running [place]. Advised him we do not have it here and he can get this at the [facility] health dept.

## 2024-05-16 ENCOUNTER — Telehealth: Payer: Self-pay | Admitting: Radiology

## 2024-05-16 NOTE — Telephone Encounter (Signed)
 Copied from CRM [id number]. Topic: General - Other >> May 16, 2024 10:25 AM Mercedes MATSU wrote: Reason for CRM: patient CALLED IN STATING THAT HE WAS CHARGED FOR MONKEY POX VACCINE FROM [facility] I informed patient he would need to contact them to discuss their payment. Patient understood. >> May 16, 2024 10:52 AM Rosina BIRCH wrote: Raoul from [facility] and medicare determination department called stating the member initiated a prior authorization for [employer]. Rosa stated they need clinical information for the patient and she will be faxing over a form requesting it [phone]

## 2024-06-18 ENCOUNTER — Other Ambulatory Visit (HOSPITAL_COMMUNITY): Payer: Self-pay

## 2024-06-18 ENCOUNTER — Telehealth: Payer: Self-pay

## 2024-06-18 NOTE — Telephone Encounter (Signed)
 Pharmacy Patient Advocate Encounter  Insurance verification completed.   The patient is insured through [employer]   Ran test claim for Ibuprofen  600 mg tablets. Currently a quantity of 60 tablets is a 30 day supply and the co-pay is 1.82 . The current 30 day co-pay is, $1.82.  No PA needed at this time.  This test claim was processed through [facility]- copay amounts may vary at other pharmacies due to pharmacy/plan contracts, or as the patient moves through the different stages of their insurance plan.

## 2024-06-18 NOTE — Telephone Encounter (Signed)
 Copied from CRM [id number]. Topic: Clinical - Medication Prior Auth >> Jun 18, 2024  1:51 PM Jasmin G wrote: Reason for CRM: Pt's preferred pharmacy, [id number] - [place], [place] - [address] AT [facility] needs prior authorization to refill ibuprofen  (ADVIL ) 600 MG tablet.

## 2024-06-18 NOTE — Telephone Encounter (Signed)
 Prior auth not needed. Copay is $1.82

## 2024-06-19 NOTE — Telephone Encounter (Signed)
 Noted

## 2024-06-23 ENCOUNTER — Ambulatory Visit: Payer: Medicare Other

## 2024-06-23 VITALS — BP 138/72 | HR 82 | Ht 66.0 in | Wt 218.4 lb

## 2024-06-23 DIAGNOSIS — Z23 Encounter for immunization: Secondary | ICD-10-CM

## 2024-06-23 DIAGNOSIS — Z0000 Encounter for general adult medical examination without abnormal findings: Secondary | ICD-10-CM | POA: Diagnosis not present

## 2024-06-23 NOTE — Progress Notes (Signed)
 Subjective:   Matthew Tucker is a 73 y.o. who presents for a Medicare Wellness preventive visit.  As a reminder, Annual Wellness Visits don't include a physical exam, and some assessments may be limited, especially if this visit is performed virtually. We may recommend an in-person follow-up visit with your provider if needed.  Visit Complete: In person  Persons Participating in Visit: Patient.  AWV Questionnaire: No: Patient Medicare AWV questionnaire was not completed prior to this visit.  Cardiac Risk Factors include: advanced age (>32men, >71 women);dyslipidemia;obesity (BMI >30kg/m2);male gender     Objective:    Today's Vitals   06/23/24 1137  BP: 138/72  Pulse: 82  SpO2: 98%  Weight: 218 lb 6.4 oz (99.1 kg)  Height: 5' 6 (1.676 m)   Body mass index is 35.25 kg/m.     06/23/2024   11:37 AM 07/12/2023    9:19 AM 06/21/2023    1:15 PM 08/14/2022   10:10 PM 08/14/2022   12:05 PM 08/10/2022   11:01 AM 06/08/2022    8:50 AM  Advanced Directives  Does Patient Have a Medical Advance Directive? No No No No No No No  Would patient like information on creating a medical advance directive? Yes (MAU/Ambulatory/Procedural Areas - Information given) No - Patient declined  No - Patient declined  No - Patient declined No - Patient declined    Current Medications (verified) Outpatient Encounter Medications as of 06/23/2024  Medication Sig   aspirin  EC 81 MG tablet Take 81 mg by mouth daily.   cyclobenzaprine  (FLEXERIL ) 5 MG tablet Take 1 tablet (5 mg total) by mouth 3 (three) times daily as needed.   furosemide  (LASIX ) 40 MG tablet Take 1 tablet (40 mg total) by mouth daily.   ibuprofen  (ADVIL ) 600 MG tablet TAKE 1 TABLET(600 MG) BY MOUTH EVERY 8 HOURS AS NEEDED   potassium chloride  (KLOR-CON  10) 10 MEQ tablet Take 1 tablet (10 mEq total) by mouth daily.   rosuvastatin  (CRESTOR ) 40 MG tablet Take 1 tablet (40 mg total) by mouth daily.   sildenafil  (VIAGRA ) 100 MG tablet  Take 0.5-1 tablets (50-100 mg total) by mouth daily as needed for erectile dysfunction.   tiZANidine  (ZANAFLEX ) 2 MG tablet Take 1 tablet (2 mg total) by mouth every 6 (six) hours as needed for muscle spasms.   No facility-administered encounter medications on file as of 06/23/2024.    Allergies (verified) Other and Levofloxacin   History: Past Medical History:  Diagnosis Date   Arthritis    Neck, bilateral hands   BACK PAIN, CHRONIC 10/31/2010   Cervical stenosis of spine    Chronic lumbar radiculopathy 05/20/2015   Diastolic dysfunction 05/21/2018   Family history of colon cancer 01-31-2013   Father died at 70yo   HYPERLIPIDEMIA Sep 16, 2007   Impaired glucose tolerance 08/29/2011   PARESTHESIA 16-Sep-2007   Toxic effect of chlorine gas(987.6) 10/31/2010   Past Surgical History:  Procedure Laterality Date   ANTERIOR CERVICAL DECOMP/DISCECTOMY FUSION N/A 08/14/2022   Procedure: ACDF - C2-C3;  Surgeon: Joshua Alm RAMAN, MD;  Location: [facility] OR;  Service: Neurosurgery;  Laterality: N/A;   BIOPSY  10/13/2021   Procedure: BIOPSY;  Surgeon: Shila Gustav GAILS, MD;  Location: [facility];  Service: Endoscopy;;   BUNIONECTOMY     CARPAL TUNNEL RELEASE Right 08/14/2022   Procedure: Right carpal tunnel release;  Surgeon: Joshua Alm RAMAN, MD;  Location: [facility] OR;  Service: Neurosurgery;  Laterality: Right;   COLONOSCOPY     COLONOSCOPY WITH  PROPOFOL  N/A 10/13/2021   Procedure: COLONOSCOPY WITH PROPOFOL ;  Surgeon: Shila Gustav GAILS, MD;  Location: [facility];  Service: Endoscopy;  Laterality: N/A;   FOOT SURGERY Right    LUMBAR LAMINECTOMY/DECOMPRESSION MICRODISCECTOMY N/A 06/20/2018   Procedure: Laminectomy and Foraminotomy - Lumbar one-Lumbar two - Lumbar two-Lumbar three - Lumbar three-Lumbar four - Lumbar four-Lumbar five;  Surgeon: Joshua Alm RAMAN, MD;  Location: [facility] OR;  Service: Neurosurgery;  Laterality: N/A;   mass removal     back, forehead; benign (lipoma)    mass removal  2011   head; benign    POLYPECTOMY  10/13/2021   Procedure: POLYPECTOMY;  Surgeon: Shila Gustav GAILS, MD;  Location: [facility];  Service: Endoscopy;;   POSTERIOR CERVICAL FUSION/FORAMINOTOMY N/A 01/03/2018   Procedure: Posterior Cervical Fusion with lateral mass fixation - Cervical three - Cervical seven, cervical laminectomy Cervical three-cervical seven;  Surgeon: Joshua Alm RAMAN, MD;  Location: [facility] OR;  Service: Neurosurgery;  Laterality: N/A;   ROOT CANAL     s/p lipoma right scalp posteriorly  2011   ULNAR NERVE TRANSPOSITION Right 08/14/2022   Procedure: Right ulnar release;  Surgeon: Joshua Alm RAMAN, MD;  Location: [facility] OR;  Service: Neurosurgery;  Laterality: Right;   Family History  Problem Relation Age of Onset   Cancer Mother        colon cancer   Colon cancer Father 3   Stroke Brother 4   Esophageal cancer Neg Hx    Stomach cancer Neg Hx    Rectal cancer Neg Hx    Social History   Socioeconomic History   Marital status: Married    Spouse name: Geophysical data processor   Number of children: 2   Years of education: Not on file   Highest education level: Associate degree: occupational, Scientist, product/process development, or vocational program  Occupational History   Occupation: Veterinary surgeon: Counsellor   Occupation: Retired  Tobacco Use   Smoking status: Never   Smokeless tobacco: Never  Vaping Use   Vaping status: Never Used  Substance and Sexual Activity   Alcohol use: Not Currently    Alcohol/week: 2.0 standard drinks of alcohol    Types: 1 Cans of beer, 1 Shots of liquor per week    Comment: occasional   Drug use: Not Currently    Types: Marijuana    Comment: last used a couple of days ago   Sexual activity: Yes  Other Topics Concern   Not on file  Social History Narrative   Not on file   Social Drivers of Health   Financial Resource Strain: Low Risk  (06/23/2024)   Overall Financial Resource Strain (CARDIA)    Difficulty of Paying Living Expenses: Not hard at all  Food Insecurity: No Food  Insecurity (06/23/2024)   Hunger Vital Sign    Worried About Running Out of Food in the Last Year: Never true    Ran Out of Food in the Last Year: Never true  Transportation Needs: No Transportation Needs (06/23/2024)   PRAPARE - Administrator, Civil Service (Medical): No    Lack of Transportation (Non-Medical): No  Physical Activity: Insufficiently Active (06/23/2024)   Exercise Vital Sign    Days of Exercise per Week: 5 days    Minutes of Exercise per Session: 20 min  Stress: No Stress Concern Present (06/23/2024)   [employer] of Occupational Health - Occupational Stress Questionnaire    Feeling of Stress: Not at all  Social Connections: Moderately Isolated (06/23/2024)  Social Advertising account executive    Frequency of Communication with Friends and Family: More than three times a week    Frequency of Social Gatherings with Friends and Family: Twice a week    Attends Religious Services: Never    Database administrator or Organizations: No    Attends Engineer, structural: Never    Marital Status: Married    Tobacco Counseling Counseling given: Not Answered    Clinical Intake:  Pre-visit preparation completed: Yes  Pain : No/denies pain     BMI - recorded: 35.25 Nutritional Status: BMI > 30  Obese Nutritional Risks: None Diabetes: No  Lab Results  Component Value Date   HGBA1C 5.5 01/16/2024   HGBA1C 5.4 12/26/2022   HGBA1C 5.5 12/23/2021     How often do you need to have someone help you when you read instructions, pamphlets, or other written materials from your doctor or pharmacy?: 1 - Never  Interpreter Needed?: No  Information entered by :: Verdie Saba, CMA   Activities of Daily Living     06/23/2024   11:41 AM  In your present state of health, do you have any difficulty performing the following activities:  Hearing? 0  Vision? 0  Difficulty concentrating or making decisions? 0  Walking or climbing stairs? 0   Dressing or bathing? 0  Doing errands, shopping? 0  Preparing Food and eating ? N  Using the Toilet? N  In the past six months, have you accidently leaked urine? N  Do you have problems with loss of bowel control? N  Managing your Medications? N  Managing your Finances? N  Housekeeping or managing your Housekeeping? N    Patient Care Team: Norleen Lynwood ORN, MD as PCP - General Paul Barrio, OD as Referring Physician (Optometry)  I have updated your Care Teams any recent Medical Services you may have received from other providers in the past year.     Assessment:   This is a routine wellness examination for Nehemiah.  Hearing/Vision screen Hearing Screening - Comments:: Denies hearing difficulties   Vision Screening - Comments:: Wears rx glasses - up to date with routine eye exams    Goals Addressed               This Visit's Progress     Patient Stated (pt-stated)        Patient stated he plans to stay active       Depression Screen     06/23/2024   11:41 AM 01/16/2024   10:56 AM 06/21/2023    1:23 PM 12/26/2022    2:15 PM 06/08/2022    9:02 AM 12/23/2021    3:07 PM 12/23/2021    2:36 PM  PHQ 2/9 Scores  PHQ - 2 Score 0 0 1 0 0 0 0  PHQ- 9 Score 0  1 1       Fall Risk     06/23/2024   11:40 AM 06/21/2023    1:15 PM 12/26/2022    2:15 PM 06/08/2022    8:51 AM 12/23/2021    3:07 PM  Fall Risk   Falls in the past year? 0 0 0 0 0  Number falls in past yr: 0  0 0 0  Injury with Fall? 0  0 0 0  Risk for fall due to : No Fall Risks  No Fall Risks No Fall Risks   Follow up Falls evaluation completed;Falls prevention discussed Falls evaluation completed;Falls prevention discussed  Falls evaluation completed Falls prevention discussed       Data saved with a previous flowsheet row definition    MEDICARE RISK AT HOME:  Medicare Risk at Home Any stairs in or around the home?: No If so, are there any without handrails?: No Home free of loose throw rugs in  walkways, pet beds, electrical cords, etc?: Yes Adequate lighting in your home to reduce risk of falls?: Yes Life alert?: No Use of a cane, walker or w/c?: No Grab bars in the bathroom?: Yes Shower chair or bench in shower?: Yes Elevated toilet seat or a handicapped toilet?: Yes  TIMED UP AND GO:  Was the test performed?  No  Cognitive Function: 6CIT completed        06/23/2024   11:42 AM 06/21/2023    1:24 PM 06/08/2022    9:06 AM 02/20/2020    8:43 AM  6CIT Screen  What Year? 0 points 0 points 0 points 0 points  What month? 0 points 0 points 0 points 0 points  What time? 0 points 0 points 0 points 0 points  Count back from 20 0 points 0 points 0 points 0 points  Months in reverse 0 points 0 points 0 points 0 points  Repeat phrase 0 points 2 points 0 points 0 points  Total Score 0 points 2 points 0 points 0 points    Immunizations Immunization History  Administered Date(s) Administered   Fluad Quad(high Dose 65+) 06/07/2021, 06/20/2023   Fluad Trivalent(High Dose 65+) 06/21/2023   INFLUENZA, HIGH DOSE SEASONAL PF 05/20/2015, 08/22/2017, 05/17/2018, 06/23/2024   Influenza Split 09/08/2011   Influenza Whole 06/12/2007   Influenza-Unspecified 06/10/2019   PFIZER(Purple Top)SARS-COV-2 Vaccination 11/10/2019, 12/11/2019, 06/21/2020, 03/08/2021   Pneumococcal Conjugate-13 01/29/2015   Pneumococcal Polysaccharide-23 08/22/2017   Td 01/04/2009   Tdap 02/20/2019   Zoster Recombinant(Shingrix) 01/03/2023, 03/26/2023   Zoster, Live 02/05/2014    Screening Tests Health Maintenance  Topic Date Due   Medicare Annual Wellness (AWV)  06/23/2025   DTaP/Tdap/Td (3 - Td or Tdap) 02/19/2029   Colonoscopy  10/14/2031   Pneumococcal Vaccine: 50+ Years  Completed   Influenza Vaccine  Completed   Hepatitis C Screening  Completed   Zoster Vaccines- Shingrix  Completed   Meningococcal B Vaccine  Aged Out   COVID-19 Vaccine  Discontinued    Health Maintenance Items  Addressed:  Vaccines Given today: Influenza High Dose  Additional Screening:  Vision Screening: Recommended annual ophthalmology exams for early detection of glaucoma and other disorders of the eye. Is the patient up to date with their annual eye exam?  No  Who is the provider or what is the name of the office in which the patient attends annual eye exams?   Dental Screening: Recommended annual dental exams for proper oral hygiene  Community Resource Referral / Chronic Care Management: CRR required this visit?  No   CCM required this visit?  No   Plan:    I have personally reviewed and noted the following in the patient's chart:   Medical and social history Use of alcohol, tobacco or illicit drugs  Current medications and supplements including opioid prescriptions. Patient is not currently taking opioid prescriptions. Functional ability and status Nutritional status Physical activity Advanced directives List of other physicians Hospitalizations, surgeries, and ER visits in previous 12 months Vitals Screenings to include cognitive, depression, and falls Referrals and appointments  In addition, I have reviewed and discussed with patient certain preventive protocols, quality metrics, and best  practice recommendations. A written personalized care plan for preventive services as well as general preventive health recommendations were provided to patient.   Verdie CHRISTELLA Saba, CMA   06/23/2024   After Visit Summary: (In Person-Declined) Patient declined AVS at this time.  Notes: Nothing significant to report at this time.

## 2024-06-23 NOTE — Patient Instructions (Addendum)
 [name],  Thank you for taking the time for your Medicare Wellness Visit. I appreciate your continued commitment to your health goals. Please review the care plan we discussed, and feel free to reach out if I can assist you further.  Medicare recommends these wellness visits once per year to help you and your care team stay ahead of potential health issues. These visits are designed to focus on prevention, allowing your provider to concentrate on managing your acute and chronic conditions during your regular appointments.  Please note that Annual Wellness Visits do not include a physical exam. Some assessments may be limited, especially if the visit was conducted virtually. If needed, we may recommend a separate in-person follow-up with your provider.  Ongoing Care Seeing your primary care provider every 3 to 6 months helps us  monitor your health and provide consistent, personalized care.   Referrals If a referral was made during today's visit and you haven't received any updates within two weeks, please contact the referred provider directly to check on the status.  Recommended Screenings:  Health Maintenance  Topic Date Due   Medicare Annual Wellness Visit  06/23/2025   DTaP/Tdap/Td vaccine (3 - Td or Tdap) 02/19/2029   Colon Cancer Screening  10/14/2031   Pneumococcal Vaccine for age over 61  Completed   Flu Shot  Completed   Hepatitis C Screening  Completed   Zoster (Shingles) Vaccine  Completed   Meningitis B Vaccine  Aged Out   COVID-19 Vaccine  Discontinued       06/23/2024   11:37 AM  Advanced Directives  Does Patient Have a Medical Advance Directive? No  Would patient like information on creating a medical advance directive? Yes (MAU/Ambulatory/Procedural Areas - Information given)   Advance Care Planning is important because it: Ensures you receive medical care that aligns with your values, goals, and preferences. Provides guidance to your family and loved ones,  reducing the emotional burden of decision-making during critical moments.  Vision: Annual vision screenings are recommended for early detection of glaucoma, cataracts, and diabetic retinopathy. These exams can also reveal signs of chronic conditions such as diabetes and high blood pressure.  Dental: Annual dental screenings help detect early signs of oral cancer, gum disease, and other conditions linked to overall health, including heart disease and diabetes.

## 2024-06-27 ENCOUNTER — Telehealth: Payer: Self-pay

## 2024-06-27 ENCOUNTER — Other Ambulatory Visit: Payer: Self-pay | Admitting: Internal Medicine

## 2024-06-27 MED ORDER — IBUPROFEN 600 MG PO TABS: ORAL_TABLET | ORAL | 2 refills | Status: AC

## 2024-06-27 NOTE — Addendum Note (Signed)
 Addended by: NORLEEN LYNWOOD ORN on: 06/27/2024 04:25 PM   Modules accepted: Orders

## 2024-06-27 NOTE — Telephone Encounter (Signed)
 Copied from CRM [id number]. Topic: Clinical - Prescription Issue >> Jun 26, 2024 12:25 PM Nessti S wrote: Reason for CRM: patient called because ibuprofen  ibuprofen  (ADVIL ) 600 MG tablet. Will need to call pharmacy on file [phone] because they said he needs a new script

## 2024-06-27 NOTE — Telephone Encounter (Signed)
 Ok done erx

## 2024-06-27 NOTE — Telephone Encounter (Unsigned)
 Copied from CRM #[id number]. Topic: Clinical - Medication Refill >> Jun 27, 2024 11:51 AM Burnard DEL wrote: Medication: ibuprofen  (ADVIL ) 600 MG tablet  Has the patient contacted their pharmacy? Yes (Agent: If no, request that the patient contact the pharmacy for the refill. If patient does not wish to contact the pharmacy document the reason why and proceed with request.) (Agent: If yes, when and what did the pharmacy advise?)  This is the patient's preferred pharmacy:  [id number] [place] - [address] AT [facility] [address] [place] [address] Phone: [phone] Fax: [phone]  Is this the correct pharmacy for this prescription? Yes If no, delete pharmacy and type the correct one.   Has the prescription been filled recently? No  Is the patient out of the medication? Yes  Has the patient been seen for an appointment in the last year OR does the patient have an upcoming appointment? Yes  Can we respond through MyChart? Yes  Agent: Please be advised that Rx refills may take up to 3 business days. We ask that you follow-up with your pharmacy.  **Patient stated that he has been trying to get this refill for the last 3 weeks**

## 2024-07-17 DIAGNOSIS — N5203 Combined arterial insufficiency and corporo-venous occlusive erectile dysfunction: Secondary | ICD-10-CM | POA: Diagnosis not present

## 2024-07-17 DIAGNOSIS — R319 Hematuria, unspecified: Secondary | ICD-10-CM | POA: Diagnosis not present

## 2024-07-17 DIAGNOSIS — N401 Enlarged prostate with lower urinary tract symptoms: Secondary | ICD-10-CM | POA: Diagnosis not present

## 2024-08-14 DIAGNOSIS — H04123 Dry eye syndrome of bilateral lacrimal glands: Secondary | ICD-10-CM | POA: Diagnosis not present

## 2024-08-20 DIAGNOSIS — N281 Cyst of kidney, acquired: Secondary | ICD-10-CM | POA: Diagnosis not present

## 2024-08-20 DIAGNOSIS — R3121 Asymptomatic microscopic hematuria: Secondary | ICD-10-CM | POA: Diagnosis not present

## 2024-08-20 DIAGNOSIS — N200 Calculus of kidney: Secondary | ICD-10-CM | POA: Diagnosis not present

## 2025-06-25 ENCOUNTER — Encounter: Admitting: Internal Medicine

## 2025-06-25 ENCOUNTER — Ambulatory Visit
